# Patient Record
Sex: Male | Born: 1955
Health system: Southern US, Community
[De-identification: ages and names within clinical notes are randomized; demographics above are authoritative.]

## PROBLEM LIST (undated history)

## (undated) DIAGNOSIS — R519 Headache, unspecified: Secondary | ICD-10-CM

## (undated) DIAGNOSIS — M1990 Unspecified osteoarthritis, unspecified site: Secondary | ICD-10-CM

## (undated) DIAGNOSIS — G8929 Other chronic pain: Secondary | ICD-10-CM

## (undated) DIAGNOSIS — I10 Essential (primary) hypertension: Secondary | ICD-10-CM

## (undated) DIAGNOSIS — F419 Anxiety disorder, unspecified: Secondary | ICD-10-CM

## (undated) DIAGNOSIS — K219 Gastro-esophageal reflux disease without esophagitis: Secondary | ICD-10-CM

## (undated) DIAGNOSIS — L02415 Cutaneous abscess of right lower limb: Secondary | ICD-10-CM

## (undated) DIAGNOSIS — I82402 Acute embolism and thrombosis of unspecified deep veins of left lower extremity: Secondary | ICD-10-CM

## (undated) DIAGNOSIS — R51 Headache: Secondary | ICD-10-CM

## (undated) DIAGNOSIS — Z9889 Other specified postprocedural states: Secondary | ICD-10-CM

## (undated) DIAGNOSIS — D649 Anemia, unspecified: Secondary | ICD-10-CM

## (undated) DIAGNOSIS — I2510 Atherosclerotic heart disease of native coronary artery without angina pectoris: Secondary | ICD-10-CM

## (undated) DIAGNOSIS — E785 Hyperlipidemia, unspecified: Secondary | ICD-10-CM

## (undated) DIAGNOSIS — R112 Nausea with vomiting, unspecified: Secondary | ICD-10-CM

## (undated) DIAGNOSIS — M159 Polyosteoarthritis, unspecified: Secondary | ICD-10-CM

## (undated) DIAGNOSIS — D689 Coagulation defect, unspecified: Secondary | ICD-10-CM

## (undated) DIAGNOSIS — F17201 Nicotine dependence, unspecified, in remission: Secondary | ICD-10-CM

## (undated) HISTORY — DX: Hyperlipidemia, unspecified: E78.5

## (undated) HISTORY — PX: HERNIA REPAIR: SHX51

## (undated) HISTORY — DX: Atherosclerotic heart disease of native coronary artery without angina pectoris: I25.10

## (undated) HISTORY — DX: Nicotine dependence, unspecified, in remission: F17.201

## (undated) HISTORY — DX: Cutaneous abscess of right lower limb: L02.415

## (undated) HISTORY — DX: Anxiety disorder, unspecified: F41.9

## (undated) HISTORY — DX: Essential (primary) hypertension: I10

## (undated) HISTORY — DX: Other chronic pain: G89.29

## (undated) HISTORY — PX: KNEE ARTHROSCOPY W/ MENISCAL REPAIR: SHX1877

## (undated) HISTORY — PX: OTHER SURGICAL HISTORY: SHX169

## (undated) HISTORY — PX: SPINE SURGERY: SHX786

## (undated) HISTORY — DX: Polyosteoarthritis, unspecified: M15.9

## (undated) HISTORY — PX: NASAL SINUS SURGERY: SHX719

## (undated) HISTORY — DX: Headache, unspecified: R51.9

## (undated) HISTORY — DX: Coagulation defect, unspecified: D68.9

## (undated) HISTORY — PX: REPLACEMENT TOTAL KNEE: SUR1224

## (undated) HISTORY — DX: Headache: R51

## (undated) HISTORY — DX: Unspecified osteoarthritis, unspecified site: M19.90

## (undated) HISTORY — PX: JOINT REPLACEMENT: SHX530

## (undated) HISTORY — DX: Gastro-esophageal reflux disease without esophagitis: K21.9

## (undated) HISTORY — DX: Acute embolism and thrombosis of unspecified deep veins of left lower extremity: I82.402

## (undated) HISTORY — DX: Anemia, unspecified: D64.9

---
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
## 2001-06-04 HISTORY — PX: UMBILICAL HERNIA REPAIR: SHX196

## 2002-01-27 ENCOUNTER — Ambulatory Visit (HOSPITAL_COMMUNITY): Admission: RE | Admit: 2002-01-27 | Discharge: 2002-01-27 | Payer: Self-pay | Admitting: Pulmonary Disease

## 2002-04-07 ENCOUNTER — Ambulatory Visit (HOSPITAL_COMMUNITY): Admission: RE | Admit: 2002-04-07 | Discharge: 2002-04-07 | Payer: Self-pay | Admitting: Pulmonary Disease

## 2002-05-22 ENCOUNTER — Encounter: Payer: Self-pay | Admitting: Orthopaedic Surgery

## 2002-05-22 ENCOUNTER — Encounter (HOSPITAL_COMMUNITY): Admission: RE | Admit: 2002-05-22 | Discharge: 2002-06-21 | Payer: Self-pay | Admitting: Orthopaedic Surgery

## 2002-08-20 ENCOUNTER — Encounter: Payer: Self-pay | Admitting: Gastroenterology

## 2004-09-22 ENCOUNTER — Ambulatory Visit (HOSPITAL_COMMUNITY): Admission: RE | Admit: 2004-09-22 | Discharge: 2004-09-22 | Payer: Self-pay | Admitting: Urology

## 2004-10-16 ENCOUNTER — Other Ambulatory Visit: Admission: RE | Admit: 2004-10-16 | Discharge: 2004-10-16 | Payer: Self-pay | Admitting: Urology

## 2005-01-22 ENCOUNTER — Ambulatory Visit (HOSPITAL_COMMUNITY): Admission: RE | Admit: 2005-01-22 | Discharge: 2005-01-22 | Payer: Self-pay | Admitting: Pulmonary Disease

## 2005-02-15 ENCOUNTER — Ambulatory Visit (HOSPITAL_COMMUNITY): Admission: RE | Admit: 2005-02-15 | Discharge: 2005-02-15 | Payer: Self-pay | Admitting: Orthopaedic Surgery

## 2005-02-15 ENCOUNTER — Encounter (INDEPENDENT_AMBULATORY_CARE_PROVIDER_SITE_OTHER): Payer: Self-pay | Admitting: Orthopaedic Surgery

## 2005-02-23 ENCOUNTER — Ambulatory Visit (HOSPITAL_COMMUNITY): Admission: RE | Admit: 2005-02-23 | Discharge: 2005-02-23 | Payer: Self-pay | Admitting: Pulmonary Disease

## 2006-06-04 HISTORY — PX: COLONOSCOPY: SHX174

## 2006-09-03 ENCOUNTER — Ambulatory Visit: Payer: Self-pay | Admitting: Gastroenterology

## 2006-09-17 ENCOUNTER — Ambulatory Visit: Payer: Self-pay | Admitting: Gastroenterology

## 2006-11-13 ENCOUNTER — Emergency Department: Payer: Self-pay | Admitting: Emergency Medicine

## 2007-08-15 ENCOUNTER — Encounter: Payer: Self-pay | Admitting: Cardiology

## 2007-08-15 LAB — CONVERTED CEMR LAB
Cholesterol: 131 mg/dL
HDL: 45 mg/dL
LDL Cholesterol: 67 mg/dL
Triglyceride fasting, serum: 96 mg/dL

## 2008-06-08 ENCOUNTER — Ambulatory Visit: Payer: Self-pay | Admitting: Cardiology

## 2008-06-08 ENCOUNTER — Ambulatory Visit (HOSPITAL_COMMUNITY): Admission: RE | Admit: 2008-06-08 | Discharge: 2008-06-08 | Payer: Self-pay | Admitting: Cardiology

## 2008-06-09 ENCOUNTER — Encounter: Payer: Self-pay | Admitting: Cardiology

## 2008-06-09 LAB — CONVERTED CEMR LAB
ALT: 25 units/L
AST: 21 units/L
Albumin: 4.6 g/dL
Alkaline Phosphatase: 77 units/L
BUN: 15 mg/dL
CO2: 24 meq/L
Calcium: 9.7 mg/dL
Chloride: 104 meq/L
Creatinine, Ser: 1.26 mg/dL
Glucose, Bld: 91 mg/dL
HCT: 41.5 %
Hemoglobin: 13.9 g/dL
Hgb A1c MFr Bld: 5.9 %
MCV: 86 fL
Platelets: 327 10*3/uL
Potassium: 4.5 meq/L
Sodium: 142 meq/L
Total Bilirubin: 0.4 mg/dL
Total Protein: 7.3 g/dL
WBC: 7.8 10*3/uL

## 2008-06-11 ENCOUNTER — Encounter: Payer: Self-pay | Admitting: Cardiology

## 2008-06-11 ENCOUNTER — Ambulatory Visit: Payer: Self-pay | Admitting: Cardiology

## 2008-06-11 ENCOUNTER — Ambulatory Visit (HOSPITAL_COMMUNITY): Admission: RE | Admit: 2008-06-11 | Discharge: 2008-06-11 | Payer: Self-pay | Admitting: Cardiology

## 2008-06-21 ENCOUNTER — Ambulatory Visit: Payer: Self-pay | Admitting: Cardiology

## 2008-06-22 ENCOUNTER — Ambulatory Visit (HOSPITAL_COMMUNITY): Admission: RE | Admit: 2008-06-22 | Discharge: 2008-06-22 | Payer: Self-pay | Admitting: Pulmonary Disease

## 2008-12-03 DIAGNOSIS — K219 Gastro-esophageal reflux disease without esophagitis: Secondary | ICD-10-CM | POA: Insufficient documentation

## 2008-12-09 ENCOUNTER — Ambulatory Visit: Payer: Self-pay | Admitting: Gastroenterology

## 2008-12-14 ENCOUNTER — Encounter (INDEPENDENT_AMBULATORY_CARE_PROVIDER_SITE_OTHER): Payer: Self-pay | Admitting: *Deleted

## 2008-12-17 ENCOUNTER — Ambulatory Visit: Payer: Self-pay | Admitting: Gastroenterology

## 2008-12-23 ENCOUNTER — Ambulatory Visit: Payer: Self-pay | Admitting: Gastroenterology

## 2008-12-31 ENCOUNTER — Ambulatory Visit: Payer: Self-pay | Admitting: Gastroenterology

## 2009-01-10 ENCOUNTER — Ambulatory Visit: Payer: Self-pay | Admitting: Gastroenterology

## 2009-01-14 ENCOUNTER — Ambulatory Visit: Payer: Self-pay | Admitting: Gastroenterology

## 2009-01-24 ENCOUNTER — Ambulatory Visit: Payer: Self-pay | Admitting: Internal Medicine

## 2009-01-31 ENCOUNTER — Ambulatory Visit: Payer: Self-pay | Admitting: Gastroenterology

## 2009-02-11 ENCOUNTER — Ambulatory Visit: Payer: Self-pay | Admitting: Gastroenterology

## 2009-02-25 ENCOUNTER — Ambulatory Visit: Payer: Self-pay | Admitting: Gastroenterology

## 2009-03-04 ENCOUNTER — Ambulatory Visit: Payer: Self-pay | Admitting: Gastroenterology

## 2009-03-07 ENCOUNTER — Telehealth: Payer: Self-pay | Admitting: Gastroenterology

## 2009-03-09 ENCOUNTER — Encounter: Payer: Self-pay | Admitting: Gastroenterology

## 2009-04-04 ENCOUNTER — Ambulatory Visit: Payer: Self-pay | Admitting: Gastroenterology

## 2009-04-25 ENCOUNTER — Encounter: Payer: Self-pay | Admitting: Gastroenterology

## 2009-04-25 ENCOUNTER — Telehealth: Payer: Self-pay | Admitting: Gastroenterology

## 2009-04-25 ENCOUNTER — Ambulatory Visit (HOSPITAL_COMMUNITY): Admission: RE | Admit: 2009-04-25 | Discharge: 2009-04-25 | Payer: Self-pay | Admitting: Gastroenterology

## 2009-05-10 ENCOUNTER — Ambulatory Visit (HOSPITAL_COMMUNITY): Admission: RE | Admit: 2009-05-10 | Discharge: 2009-05-10 | Payer: Self-pay | Admitting: Gastroenterology

## 2009-05-10 ENCOUNTER — Ambulatory Visit: Payer: Self-pay | Admitting: Gastroenterology

## 2009-05-12 ENCOUNTER — Encounter: Payer: Self-pay | Admitting: Gastroenterology

## 2009-05-13 ENCOUNTER — Telehealth: Payer: Self-pay | Admitting: Gastroenterology

## 2009-06-08 ENCOUNTER — Ambulatory Visit: Payer: Self-pay | Admitting: Gastroenterology

## 2009-06-21 ENCOUNTER — Telehealth: Payer: Self-pay | Admitting: Gastroenterology

## 2009-08-12 ENCOUNTER — Encounter: Payer: Self-pay | Admitting: *Deleted

## 2009-08-15 ENCOUNTER — Encounter: Payer: Self-pay | Admitting: Cardiology

## 2009-08-16 ENCOUNTER — Ambulatory Visit: Payer: Self-pay | Admitting: Cardiology

## 2009-08-16 ENCOUNTER — Encounter (INDEPENDENT_AMBULATORY_CARE_PROVIDER_SITE_OTHER): Payer: Self-pay | Admitting: *Deleted

## 2009-08-26 ENCOUNTER — Encounter: Payer: Self-pay | Admitting: Cardiology

## 2009-08-29 ENCOUNTER — Ambulatory Visit: Payer: Self-pay | Admitting: Cardiology

## 2009-08-29 ENCOUNTER — Encounter: Payer: Self-pay | Admitting: Cardiology

## 2009-08-29 ENCOUNTER — Encounter (INDEPENDENT_AMBULATORY_CARE_PROVIDER_SITE_OTHER): Payer: Self-pay | Admitting: *Deleted

## 2009-09-03 ENCOUNTER — Encounter: Payer: Self-pay | Admitting: Cardiology

## 2009-09-03 LAB — CONVERTED CEMR LAB
BUN: 16 mg/dL (ref 6–23)
Basophils Absolute: 0 10*3/uL (ref 0.0–0.1)
Basophils Relative: 0 % (ref 0–1)
CO2: 26 meq/L (ref 19–32)
Calcium: 9.5 mg/dL (ref 8.4–10.5)
Chloride: 105 meq/L (ref 96–112)
Creatinine, Ser: 0.99 mg/dL (ref 0.40–1.50)
Eosinophils Absolute: 0.2 10*3/uL (ref 0.0–0.7)
Eosinophils Relative: 2 % (ref 0–5)
Glucose, Bld: 108 mg/dL — ABNORMAL HIGH (ref 70–99)
HCT: 43.2 % (ref 39.0–52.0)
Hemoglobin: 14.7 g/dL (ref 13.0–17.0)
INR: 0.98 (ref <1.50)
Lymphocytes Relative: 25 % (ref 12–46)
Lymphs Abs: 2 10*3/uL (ref 0.7–4.0)
MCHC: 34 g/dL (ref 30.0–36.0)
MCV: 86.2 fL (ref 78.0–100.0)
Monocytes Absolute: 0.6 10*3/uL (ref 0.1–1.0)
Monocytes Relative: 8 % (ref 3–12)
Neutro Abs: 5.1 10*3/uL (ref 1.7–7.7)
Neutrophils Relative %: 64 % (ref 43–77)
Platelets: 313 10*3/uL (ref 150–400)
Potassium: 4.3 meq/L (ref 3.5–5.3)
Prothrombin Time: 12.9 s (ref 11.6–15.2)
RBC: 5.01 M/uL (ref 4.22–5.81)
RDW: 12.9 % (ref 11.5–15.5)
Sodium: 140 meq/L (ref 135–145)
WBC: 8 10*3/uL (ref 4.0–10.5)
aPTT: 26 s (ref 24–37)

## 2009-09-06 ENCOUNTER — Encounter (INDEPENDENT_AMBULATORY_CARE_PROVIDER_SITE_OTHER): Payer: Self-pay | Admitting: *Deleted

## 2009-09-07 ENCOUNTER — Telehealth: Payer: Self-pay | Admitting: Cardiology

## 2009-09-12 ENCOUNTER — Encounter: Payer: Self-pay | Admitting: Cardiology

## 2009-09-12 ENCOUNTER — Encounter (INDEPENDENT_AMBULATORY_CARE_PROVIDER_SITE_OTHER): Payer: Self-pay

## 2009-09-13 ENCOUNTER — Encounter (INDEPENDENT_AMBULATORY_CARE_PROVIDER_SITE_OTHER): Payer: Self-pay | Admitting: *Deleted

## 2009-09-13 LAB — CONVERTED CEMR LAB
BUN: 14 mg/dL
BUN: 14 mg/dL
BUN: 14 mg/dL (ref 6–23)
Basophils Absolute: 0 10*3/uL (ref 0.0–0.1)
Basophils Relative: 1 % (ref 0–1)
CO2: 24 meq/L
CO2: 24 meq/L
CO2: 24 meq/L (ref 19–32)
Calcium: 9.3 mg/dL
Calcium: 9.3 mg/dL
Calcium: 9.3 mg/dL (ref 8.4–10.5)
Chloride: 103 meq/L
Chloride: 103 meq/L
Chloride: 103 meq/L (ref 96–112)
Creatinine, Ser: 0.86 mg/dL
Creatinine, Ser: 0.86 mg/dL
Creatinine, Ser: 0.86 mg/dL (ref 0.40–1.50)
Eosinophils Absolute: 0.2 10*3/uL (ref 0.0–0.7)
Eosinophils Relative: 3 % (ref 0–5)
Glucose, Bld: 84 mg/dL
Glucose, Bld: 84 mg/dL
Glucose, Bld: 84 mg/dL (ref 70–99)
HCT: 40.2 %
HCT: 40.2 %
HCT: 40.2 % (ref 39.0–52.0)
Hemoglobin: 13.2 g/dL
Hemoglobin: 13.2 g/dL
Hemoglobin: 13.2 g/dL (ref 13.0–17.0)
INR: 1
INR: 1 (ref <1.50)
Lymphocytes Relative: 29 % (ref 12–46)
Lymphs Abs: 2.4 10*3/uL (ref 0.7–4.0)
MCHC: 32.8 g/dL (ref 30.0–36.0)
MCV: 86.8 fL
MCV: 86.8 fL
MCV: 86.8 fL (ref 78.0–100.0)
Monocytes Absolute: 0.7 10*3/uL (ref 0.1–1.0)
Monocytes Relative: 9 % (ref 3–12)
Neutro Abs: 4.9 10*3/uL (ref 1.7–7.7)
Neutrophils Relative %: 59 % (ref 43–77)
POC INR: 1
Platelets: 319 10*3/uL
Platelets: 319 10*3/uL
Platelets: 319 10*3/uL (ref 150–400)
Potassium: 4.2 meq/L
Potassium: 4.2 meq/L
Potassium: 4.2 meq/L (ref 3.5–5.3)
Prothrombin Time: 13.1 s
Prothrombin Time: 13.1 s
Prothrombin Time: 13.1 s (ref 11.6–15.2)
RBC: 4.63 M/uL (ref 4.22–5.81)
RDW: 12.9 % (ref 11.5–15.5)
Sodium: 140 meq/L
Sodium: 140 meq/L
Sodium: 140 meq/L (ref 135–145)
WBC: 8.3 10*3/uL
WBC: 8.3 10*3/uL
WBC: 8.3 10*3/uL (ref 4.0–10.5)
aPTT: 27 s
aPTT: 27 s
aPTT: 27 s (ref 24–37)

## 2009-09-14 ENCOUNTER — Encounter (INDEPENDENT_AMBULATORY_CARE_PROVIDER_SITE_OTHER): Payer: Self-pay | Admitting: *Deleted

## 2009-09-15 ENCOUNTER — Encounter (INDEPENDENT_AMBULATORY_CARE_PROVIDER_SITE_OTHER): Payer: Self-pay | Admitting: *Deleted

## 2009-09-19 ENCOUNTER — Ambulatory Visit: Payer: Self-pay | Admitting: Cardiovascular Disease

## 2009-09-19 ENCOUNTER — Inpatient Hospital Stay (HOSPITAL_BASED_OUTPATIENT_CLINIC_OR_DEPARTMENT_OTHER): Admission: RE | Admit: 2009-09-19 | Discharge: 2009-09-19 | Payer: Self-pay | Admitting: Cardiovascular Disease

## 2009-10-06 ENCOUNTER — Ambulatory Visit: Payer: Self-pay | Admitting: Cardiology

## 2009-10-06 ENCOUNTER — Encounter (INDEPENDENT_AMBULATORY_CARE_PROVIDER_SITE_OTHER): Payer: Self-pay | Admitting: *Deleted

## 2009-10-07 ENCOUNTER — Encounter: Payer: Self-pay | Admitting: Adult Health

## 2009-11-04 ENCOUNTER — Encounter: Payer: Self-pay | Admitting: *Deleted

## 2009-11-10 ENCOUNTER — Ambulatory Visit: Payer: Self-pay | Admitting: Cardiology

## 2009-11-10 DIAGNOSIS — F411 Generalized anxiety disorder: Secondary | ICD-10-CM | POA: Insufficient documentation

## 2010-01-03 ENCOUNTER — Encounter (INDEPENDENT_AMBULATORY_CARE_PROVIDER_SITE_OTHER): Payer: Self-pay | Admitting: *Deleted

## 2010-01-03 LAB — CONVERTED CEMR LAB
ALT: 19 units/L
ALT: 19 units/L (ref 0–53)
AST: 18 units/L
AST: 18 units/L (ref 0–37)
Albumin: 4.4 g/dL
Albumin: 4.4 g/dL (ref 3.5–5.2)
Alkaline Phosphatase: 57 units/L
Alkaline Phosphatase: 57 units/L (ref 39–117)
BUN: 20 mg/dL
BUN: 20 mg/dL (ref 6–23)
CO2: 22 meq/L
CO2: 22 meq/L (ref 19–32)
Calcium: 9.5 mg/dL
Calcium: 9.5 mg/dL (ref 8.4–10.5)
Chloride: 106 meq/L
Chloride: 106 meq/L (ref 96–112)
Cholesterol: 142 mg/dL
Cholesterol: 142 mg/dL (ref 0–200)
Creatinine, Ser: 1.01 mg/dL
Creatinine, Ser: 1.01 mg/dL (ref 0.40–1.50)
Glucose, Bld: 110 mg/dL
Glucose, Bld: 110 mg/dL — ABNORMAL HIGH (ref 70–99)
HDL: 46 mg/dL
HDL: 46 mg/dL (ref >39)
LDL Cholesterol: 82 mg/dL
LDL Cholesterol: 82 mg/dL (ref 0–99)
Potassium: 4.7 meq/L
Potassium: 4.7 meq/L (ref 3.5–5.3)
Sodium: 139 meq/L
Sodium: 139 meq/L (ref 135–145)
Total Bilirubin: 0.4 mg/dL (ref 0.3–1.2)
Total CHOL/HDL Ratio: 3.1
Total Protein: 7 g/dL
Total Protein: 7 g/dL (ref 6.0–8.3)
Triglycerides: 70 mg/dL
Triglycerides: 70 mg/dL (ref <150)
VLDL: 14 mg/dL (ref 0–40)

## 2010-01-11 ENCOUNTER — Encounter (INDEPENDENT_AMBULATORY_CARE_PROVIDER_SITE_OTHER): Payer: Self-pay | Admitting: *Deleted

## 2010-01-17 ENCOUNTER — Ambulatory Visit: Payer: Self-pay | Admitting: Cardiology

## 2010-01-18 ENCOUNTER — Encounter: Payer: Self-pay | Admitting: Cardiology

## 2010-02-16 ENCOUNTER — Encounter (INDEPENDENT_AMBULATORY_CARE_PROVIDER_SITE_OTHER): Payer: Self-pay | Admitting: *Deleted

## 2010-02-16 ENCOUNTER — Ambulatory Visit: Payer: Self-pay | Admitting: Cardiology

## 2010-02-23 ENCOUNTER — Encounter (INDEPENDENT_AMBULATORY_CARE_PROVIDER_SITE_OTHER): Payer: Self-pay | Admitting: *Deleted

## 2010-02-23 LAB — CONVERTED CEMR LAB
Cholesterol: 140 mg/dL
Cholesterol: 140 mg/dL (ref 0–200)
HDL: 43 mg/dL
HDL: 43 mg/dL (ref >39)
LDL Cholesterol: 78 mg/dL
LDL Cholesterol: 78 mg/dL (ref 0–99)
Total CHOL/HDL Ratio: 3.3
Triglycerides: 97 mg/dL
Triglycerides: 97 mg/dL (ref <150)
VLDL: 19 mg/dL (ref 0–40)

## 2010-02-24 ENCOUNTER — Encounter (INDEPENDENT_AMBULATORY_CARE_PROVIDER_SITE_OTHER): Payer: Self-pay | Admitting: *Deleted

## 2010-03-15 ENCOUNTER — Encounter (INDEPENDENT_AMBULATORY_CARE_PROVIDER_SITE_OTHER): Payer: Self-pay | Admitting: *Deleted

## 2010-03-18 LAB — CONVERTED CEMR LAB
Cholesterol: 136 mg/dL (ref 0–200)
HDL: 48 mg/dL (ref >39)
LDL Cholesterol: 70 mg/dL (ref 0–99)
Total CHOL/HDL Ratio: 2.8
Triglycerides: 89 mg/dL (ref <150)
VLDL: 18 mg/dL (ref 0–40)

## 2010-03-23 ENCOUNTER — Encounter (INDEPENDENT_AMBULATORY_CARE_PROVIDER_SITE_OTHER): Payer: Self-pay | Admitting: *Deleted

## 2010-03-23 ENCOUNTER — Ambulatory Visit: Payer: Self-pay | Admitting: Cardiology

## 2010-06-25 ENCOUNTER — Encounter: Payer: Self-pay | Admitting: Cardiology

## 2010-07-02 LAB — CONVERTED CEMR LAB
LDL (calc): 82 mg/dL
Triglyceride fasting, serum: 70 mg/dL

## 2010-07-04 NOTE — Progress Notes (Signed)
Summary: pt sch for cath 09-08-09 but has slight cold   Phone Note Call from Patient Call back at [facility] Phone [phone] Call back at [phone] cell   Caller: pt Reason for Call: Talk to Nurse Summary of Call: pt is scheduled to have cath 09-08-09. He has had a cold for about a week and is almost over it but is still slightly congested, is the okay or does he need to reschedule his cath? Initial call taken by: Faythe Ghee,  September 07, 2009 9:19 AM  Follow-up for Phone Call        cath cx, pt will call when he is well and we will r/s for 1 week later per jv cath lab Follow-up by: Teressa Lower RN,  September 07, 2009 10:00 AM

## 2010-07-04 NOTE — Letter (Signed)
Summary: [place] [employer], agricultural at [facility]  [address] [place] [address]   Phone: [phone]  Fax: [phone]      September 06, 2009 MRN: [id number]   [facility] [address] [place], [place]  [address]   Dear Timothy Shaw,  Your test ordered by Selena Batten has been reviewed by your physician (or physician assistant) and was found to be normal or stable. Your physician (or physician assistant) felt no changes were needed at this time.  ____ Echocardiogram  ____ Cardiac Stress Test  __x__ Lab Work  ____ Peripheral vascular study of arms, legs or neck  ____ CT scan or X-ray  ____ Lung or Breathing test  ____ Other:   Thank you.   Teressa Lower RN    Boerne Bing, MD, F.A.C.Gaylord Shih, MD, F.A.C.C Lewayne Bunting, MD, F.A.C.C Nona Dell, MD, F.A.C.C Charlton Haws, MD, Lenise Arena.C.C

## 2010-07-04 NOTE — Assessment & Plan Note (Signed)
Summary: DUE FOR F/U PER PT PHONE CALL/TG  Medications Added NEXIUM 40 MG CPDR (ESOMEPRAZOLE MAGNESIUM) take 1 tab daily NITROSTAT 0.4 MG SUBL (NITROGLYCERIN) 1 tablet under tongue at onset of chest pain; you may repeat every 5 minutes for up to 3 doses.      Allergies Added: NKDA  Visit Type:  Follow-up Referring Provider:  Gerald Leitz Primary Provider:  Kari Baars, MD   History of Present Illness: Return visit for this pleasant 55 year old gentleman sent for reevaluation of chest discomfort.  I had the pleasure of evaluating Timothy Shaw last year at which time a stress echocardiogram was negative.  He did well for a few months, gradually increasing exercise capacity, but subsequently noted burning upper substernal chest discomfort associated with neck discomfort occurring with moderate exercise.  There were no associated symptoms.  There is no radiation of the discomfort.  These symptoms are distinct from his usual gastroesophageal reflux disease.  Current Medications (verified): 1)  Cozaar 100 Mg Tabs (Losartan Potassium) .Marland Kitchen.. 1 Tablet By Mouth Once Daily 2)  Crestor 5 Mg Tabs (Rosuvastatin Calcium) .Marland Kitchen.. 1 By Mouth Once Daily 3)  Tums 500 Mg Chew (Calcium Carbonate Antacid) .... Take 3-4 Times Per Day As Needed 4)  Nexium 40 Mg Cpdr (Esomeprazole Magnesium) .... Take 1 Tab Daily 5)  Nitrostat 0.4 Mg Subl (Nitroglycerin) .Marland Kitchen.. 1 Tablet Under Tongue At Onset of Chest Pain; You May Repeat Every 5 Minutes For Up To 3 Doses.  Allergies (verified): No Known Drug Allergies  Past History:  PMS  Past Surgical History: Herniorrhaphy-umbilical Appendectomy Tonsillectomy Sinus surgery Laparoscopic right knee surgery  Review of Systems       see history of present illness.  Vital Signs:  Patient profile:   55 year old male Height:      74 inches Weight:      215 pounds BMI:     27.70 Pulse rate:   66 / minute BP sitting:   127 / 74  (right arm)  Vitals Entered By: Dreama Saa, CNA (August 16, 2009 1:39 PM)  Physical Exam  General:  Pleasant proportionate gentleman in no acute distress. Weight is 215 pounds, 2 pounds less than at his last visit NECK:  No jugular venous distention; normal carotid upstrokes without bruits. HEMATOPOIETIC:  No adenopathy. LUNGS:  Clear. CARDIAC:  Normal first and second heart sound; normal PMI. ABDOMEN:  Soft and nontender; no organomegaly. EXTREMITIES:  No edema; normal distal pulses. NEUROLOGIC:  Symmetric strength and tone; normal cranial nerves.  SKIN:  no significant lesions.   Impression & Recommendations:  Problem # 1:  CHEST PAIN (ICD-786.50) Patient's symptoms would qualify as classic angina if not for a long-standing history of atypical chest discomfort and for a fairly recent negative stress test.  We will proceed with a stress nuclear study to reevaluate exercise tolerance, to determine if symptoms can be elicited by exercise and rule out myocardial ischemia.  In the interim, he will be provided with a prescription for sublingual nitroglycerin to use as needed had these symptoms and to determine iif that drug shortens the duration of spells and improves exercise tolerance.  Problem # 2:  HYPERLIPIDEMIA (ICD-272.4) Most recent lipid profile available to me is from 2 years ago, but was excellent.  We will seek more recent values from his primary care doctor.  Problem # 3:  HYPERTENSION (ICD-401.1) Blood pressure control is excellent with very modest medical therapy.  I will reassess this nice gentleman after his stress test  has been completed.  Other Orders: Nuclear Stress Test (Nuc Stress Test)  Patient Instructions: 1)  Your physician recommends that you schedule a follow-up appointment in: after stress test 2)  Your physician has requested that you have an exercise stress myoview.  For further information please visit [email or web address]  Please follow instruction sheet, as  given. Prescriptions: NITROSTAT 0.4 MG SUBL (NITROGLYCERIN) 1 tablet under tongue at onset of chest pain; you may repeat every 5 minutes for up to 3 doses.  #25 x 3   Entered by:   Teressa Lower RN   Authorized by:   Kathlen Brunswick, MD, [facility]   Signed by:   Teressa Lower RN on 08/16/2009   Method used:   Electronically to        [facility] (retail)       [address]       [place], [place]  [address]       Ph: [phone]       Fax: [phone]   RxID:   [id number]

## 2010-07-04 NOTE — Miscellaneous (Signed)
Summary: Orders Update  Clinical Lists Changes  Orders: Added new Test order of T-Basic Metabolic Panel [id number]) - Signed Added new Test order of T-CBC w/Diff [phone]) - Signed Added new Test order of T-Protime, Auto ([id number]) - Signed Added new Test order of T-PTT ([id number]) - Signed Added new Referral order of Cardiac Catheterization (Cardiac Cath) - Signed    Pt. came into office to reschedule cath, he states he is feeling better from recent cold. Pt. given Cath instuctions and labs orders, he states he understands info. given. Cath scheduled for 09-19-09 @ 8:30am in [facility] lab with Dr. Excell Seltzer or Dr. Riley Kill.

## 2010-07-04 NOTE — Assessment & Plan Note (Signed)
Summary: eph post cath  Medications Added SIMVASTATIN 40 MG TABS (SIMVASTATIN) Take one tablet by mouth daily at bedtime ASPIR-TRIN 325 MG TBEC (ASPIRIN) take 1 tab daily METOPROLOL TARTRATE 50 MG TABS (METOPROLOL TARTRATE) Take one tablet by mouth twice a day AMLODIPINE BESYLATE 5 MG TABS (AMLODIPINE BESYLATE) Take one tablet by mouth daily      Allergies Added: NKDA  Visit Type:  Follow-up Referring Provider:  Gerald Leitz Primary Provider:  Kari Baars, MD   History of Present Illness: [name] returns to the office following a recent cardiac catheterization, which revealed total occlusion of the right coronary artery and a 60% mid LAD stenosis.  The RCA was not considered favorable for percutaneous intervention.  [name] now comes to the office to discuss his options, either medical therapy or CABG surgery.  He has resumed his usual physical activity, which has produced angina as before; however, after taking one nitroglycerin, he is able to do much more than he has been able to do for some time.   Current Medications (verified): 1)  Cozaar 100 Mg Tabs (Losartan Potassium) .Marland Kitchen.. 1 Tablet By Mouth Once Daily 2)  Simvastatin 40 Mg Tabs (Simvastatin) .... Take One Tablet By Mouth Daily At Bedtime 3)  Tums 500 Mg Chew (Calcium Carbonate Antacid) .... Take 3-4 Times Per Day As Needed 4)  Nexium 40 Mg Cpdr (Esomeprazole Magnesium) .... Take 1 Tab Daily 5)  Nitrostat 0.4 Mg Subl (Nitroglycerin) .Marland Kitchen.. 1 Tablet Under Tongue At Onset of Chest Pain; You May Repeat Every 5 Minutes For Up To 3 Doses. 6)  Aspir-Trin 325 Mg Tbec (Aspirin) .... Take 1 Tab Daily 7)  Metoprolol Tartrate 50 Mg Tabs (Metoprolol Tartrate) .... Take One Tablet By Mouth Twice A Day 8)  Amlodipine Besylate 5 Mg Tabs (Amlodipine Besylate) .... Take One Tablet By Mouth Daily  Allergies (verified): No Known Drug Allergies  Past History:  PMH, FH, and Social History reviewed and updated.  Past Medical  History: ASCVD with angina: 09/2009-100% proximal RCA; 60% mid LAD; normal EF Hyperlipidemia Hypertension-Left ventricular hypertrophy Gastroesophageal reflux disease Anxiety Disorder Arthritis Chronic Headaches  Review of Systems       See HPI.  Vital Signs:  Patient profile:   55 year old male Weight:      208 pounds Pulse rate:   68 / minute BP sitting:   150 / 77  (right arm)  Vitals Entered By: Dreama Saa, CNA (Oct 06, 2009 1:06 PM)  Physical Exam  General:  Pleasant proportionate gentleman in no acute distress. Weight is 215 pounds, 2 pounds less than at his last visit NECK:  No jugular venous distention; normal carotid upstrokes without bruits. HEMATOPOIETIC:  No adenopathy. LUNGS:  Clear. CARDIAC:  Normal first and second heart sound; normal PMI. ABDOMEN:  Soft and nontender; no organomegaly. EXTREMITIES:  No edema; normal distal pulses; cath site is benign. NEUROLOGIC:  Symmetric strength and tone; normal cranial nerves.  SKIN:  no significant lesions.   Impression & Recommendations:  Problem # 1:  ATHEROSCLEROTIC CARDIOVASCULAR DISEASE (ICD-429.2) 30 minute conversation with patient and family regarding his situation.  I explained the pathophysiology of coronary disease, his exact anatomy and our goals for treatment.  Since he is low risk for sudden death or a serious myocardial infarction, I have recommended an initial trial of medical therapy.  He'll start metoprolol 50 mg b.i.d. and amlodipine 5 mg q.d. and will continue to use nitroglycerin as needed.  I did not suggest  the use of nitroglycerin prophylactically prior to planned activity, but this may be a useful approach in the future.  Additional therapies to consider are long-acting nitrates and ranolazine.  Symptoms will be reassessed at a return visit in one month.  Other Orders: Future Orders: T-Lipid Profile ([id number]) ... 01/06/2010 T-Comprehensive Metabolic Panel [id number]) ...  01/06/2010  Patient Instructions: 1)  Your physician recommends that you schedule a follow-up appointment in: 1 month  2)  Your physician recommends that you return for lab work in: 3 months 3)  Your physician has recommended you make the following change in your medication: metoprolol 50mg  two times a day 4)  change crestor to simvastatin 40mg  daily 5)  amlodipine 5mg  daily Prescriptions: AMLODIPINE BESYLATE 5 MG TABS (AMLODIPINE BESYLATE) Take one tablet by mouth daily  #30 x 6   Entered by:   Teressa Lower RN   Authorized by:   Joni Reining, NP   Signed by:   Teressa Lower RN on 10/06/2009   Method used:   Electronically to        [facility] (retail)       [address]       [place], [place]  [address]       Ph: [phone]       Fax: [phone]   RxID:   [id number] METOPROLOL TARTRATE 50 MG TABS (METOPROLOL TARTRATE) Take one tablet by mouth twice a day  #60 x 3   Entered by:   Teressa Lower RN   Authorized by:   Joni Reining, NP   Signed by:   Teressa Lower RN on 10/06/2009   Method used:   Electronically to        [facility] (retail)       [address]       [place], [place]  [address]       Ph: [phone]       Fax: [phone]   RxID:   [id number] SIMVASTATIN 40 MG TABS (SIMVASTATIN) Take one tablet by mouth daily at bedtime  #30 x 6   Entered by:   Teressa Lower RN   Authorized by:   Joni Reining, NP   Signed by:   Teressa Lower RN on 10/06/2009   Method used:   Electronically to        [facility] (retail)       [address]       [place], [place]  [address]       Ph: [phone]       Fax: [phone]   RxID:   [id number]

## 2010-07-04 NOTE — Letter (Signed)
Summary: Cardiac Catheterization Instructions- [facility] Lab  [facility] [place]  [address] [place] [address]   Phone: [phone]  Fax: [phone]     08/29/2009 MRN: [id number]  [facility] [address] [place], [place]  [address]  Dear Mr. Gehring,   You are scheduled for a Cardiac Catheterization on September 08, 2009 with Dr.Cooper  Please arrive to the 1st floor of the Heart and Vascular Center at [facility] at 7:30 am on the day of your procedure. Please do not arrive before 6:30 a.m. Call the Heart and Vascular Center at [phone] if you are unable to make your appointmnet. The Code to get into the parking garage under the building is 0001. Take the elevators to the 1st floor. You must have someone to drive you home. Someone must be with you for the first 24 hours after you arrive home. Please wear clothes that are easy to get on and off and wear slip-on shoes. Do not eat or drink after midnight except water with your medications that morning. Bring all your medications and current insurance cards with you.  ___ DO NOT take these medications before your procedure: ________________________________________________________________  ___ Make sure you take your aspirin.  _x__ You may take ALL of your medications with water that morning. ________________________________________________________________________________________________________________________________  ___ DO NOT take ANY medications before your procedure.  ___ Pre-med instructions:  ________________________________________________________________________________________________________________________________  The usual length of stay after your procedure is 2 to 3 hours. This can vary.  If you have any questions, please call the office at the number listed above.   Teressa Lower RN

## 2010-07-04 NOTE — Miscellaneous (Signed)
Summary: labs cbcd,pt,ptt,inr,bmp,09/13/2009  Clinical Lists Changes  Observations: Added new observation of CALCIUM: 9.3 mg/dL (16/03/9603 54:09) Added new observation of CREATININE: 0.86 mg/dL (81/19/1478 29:56) Added new observation of BUN: 14 mg/dL (21/30/8657 84:69) Added new observation of BG RANDOM: 84 mg/dL (62/95/2841 32:44) Added new observation of CO2 PLSM/SER: 24 meq/L (09/13/2009 15:54) Added new observation of CL SERUM: 103 meq/L (09/13/2009 15:54) Added new observation of K SERUM: 4.2 meq/L (09/13/2009 15:54) Added new observation of NA: 140 meq/L (09/13/2009 15:54) Added new observation of PLATELETK/UL: 319 K/uL (09/13/2009 15:54) Added new observation of MCV: 86.8 fL (09/13/2009 15:54) Added new observation of HCT: 40.2 % (09/13/2009 15:54) Added new observation of HGB: 13.2 g/dL (06/06/7251 66:44) Added new observation of WBC COUNT: 8.3 10*3/microliter (09/13/2009 15:54) Added new observation of INR: 1.00  (09/13/2009 15:54) Added new observation of PT PATIENT: 13.1 s (09/13/2009 15:54) Added new observation of PTT PATIENT: 27 s (09/13/2009 15:54)

## 2010-07-04 NOTE — Assessment & Plan Note (Signed)
Summary: 1 mth f/u per checkout on 10/05/09/tg  Medications Added DOXYCYCLINE HYCLATE 100 MG CAPS (DOXYCYCLINE HYCLATE) take 1 cap two times a day      Allergies Added: NKDA  Visit Type:  Follow-up Referring Provider:  Gerald Shaw Primary Provider:  Kari Baars, MD   History of Present Illness: [name] returns to the office as scheduled for continued assessment and treatment of coronary artery disease and presumed angina as well ashypertension and hyperlipidemia.  Since his last visit one month ago, he suffered a tick bite and developed signs and symptoms of Lyme disease.  Treatment with antibiotics was initiated resulting in symptomatic improvement.  He stills feels fatigued, but skin rash and arthritis have resolved.  He has not noted much in way of chest discomfort, but activity has been limited.  Current Medications (verified): 1)  Simvastatin 40 Mg Tabs (Simvastatin) .... Take One Tablet By Mouth Daily At Bedtime 2)  Tums 500 Mg Chew (Calcium Carbonate Antacid) .... Take 3-4 Times Per Day As Needed 3)  Nexium 40 Mg Cpdr (Esomeprazole Magnesium) .... Take 1 Tab Daily 4)  Nitrostat 0.4 Mg Subl (Nitroglycerin) .Marland Kitchen.. 1 Tablet Under Tongue At Onset of Chest Pain; You May Repeat Every 5 Minutes For Up To 3 Doses. 5)  Aspir-Trin 325 Mg Tbec (Aspirin) .... Take 1 Tab Daily 6)  Metoprolol Tartrate 50 Mg Tabs (Metoprolol Tartrate) .... Take One Tablet By Mouth Twice A Day 7)  Amlodipine Besylate 5 Mg Tabs (Amlodipine Besylate) .... Take One Tablet By Mouth Daily 8)  Doxycycline Hyclate 100 Mg Caps (Doxycycline Hyclate) .... Take 1 Cap Two Times A Day  Allergies (verified): No Known Drug Allergies  Past History:   PMH, FH, and Social History reviewed and updated.  Past Medical History: ASCVD with angina: 09/2009-100% proximal RCA; 60% mid LAD; normal EF Hyperlipidemia Hypertension-Left ventricular hypertrophy Gastroesophageal reflux disease Anxiety Disorder Arthritis;Lyme  disease in 10/2009 treated with antibiotics Chronic Headaches  Review of Systems       See history of present illness.  Vital Signs:  Patient profile:   55 year old male Weight:      201 pounds BMI:     25.90 Pulse rate:   57 / minute BP sitting:   125 / 71  (right arm)  Vitals Entered By: Timothy Saa, CNA (November 10, 2009 1:13 PM)  Physical Exam  General:  Pleasant gentleman in no acute distress; proportion in weight and height. NECK:  No jugular venous distention; normal carotid upstrokes without bruits. HEMATOPOIETIC:  No adenopathy. LUNGS:  Clear. CARDIAC:  Normal first and second heart sound; normal PMI.  Minimal systolic ejection murmur at the left sternal border ABDOMEN:  Soft and nontender; no organomegaly. EXTREMITIES:  No edema; normal distal pulses; cath site is benign.   NEUROLOGIC:  Symmetric strength and tone; normal cranial nerves.  SKIN:  healing abrasion over the left great toe   Impression & Recommendations:  Problem # 1:  ATHEROSCLEROTIC CARDIOVASCULAR DISEASE (ICD-429.2) Angina is improved, but assessment is somewhat limited due to concomitant illness.   Beta blockade appears appropriately dosed with a heart rate in the high 50s.  In the absence of symptoms, the dose of amlodipine will not be increased normal any other antianginal medications be added.  [name] is encouraged to be as active as possible as he recovers from his episode of Lyme disease.  Problem # 2:  HYPERTENSION (ICD-401.1) Blood pressure control is excellent; current medications will be continued.  Problem #  3:  HYPERLIPIDEMIA (ICD-272.4) Repeat lipid profile on medication is pending.  I will plan to reassess this nice gentleman symptoms in 2 months.  Patient Instructions: 1)  Your physician recommends that you schedule a follow-up appointment in: 2 months 2)  Your physician recommends that you continue on your current medications as directed. Please refer to the Current Medication  list given to you today.

## 2010-07-04 NOTE — Assessment & Plan Note (Signed)
Summary: FOLLOW-UP  FROM ENDO/BRAVO            Timothy Shaw    History of Present Illness Visit Type: Follow-up Visit Primary GI MD: Melvia Heaps MD [facility] Primary Provider: Kari Baars, MD Requesting Provider: NA Chief Complaint: Still c/o acid relux and esophageal burning more frequent since been on new med History of Present Illness:   55. Timothy Shaw has returned for followup of his chest discomfort.  A 54 hour pH study while on Nexium unequivocally indicated that he did not have significant acid reflux.  Nexium was discontinued and he was placed on Carafate for the possibility of bile reflux.  The patient reports frequent burning chest discomfort that may radiate into his throat in a particularly heavy meal or with exercise.  Sweets and chocolate can also bring on these symptoms.  A stress test  almost a year ago that did not demonstrate a cardiac ischemia.   GI Review of Systems    Reports acid reflux and  heartburn.      Denies abdominal pain, belching, bloating, chest pain, dysphagia with liquids, dysphagia with solids, loss of appetite, nausea, vomiting, vomiting blood, weight loss, and  weight gain.        Denies anal fissure, black tarry stools, change in bowel habit, constipation, diarrhea, diverticulosis, fecal incontinence, heme positive stool, hemorrhoids, irritable bowel syndrome, jaundice, light color stool, liver problems, rectal bleeding, and  rectal pain.    Current Medications (verified): 1)  Cozaar 100 Mg Tabs (Losartan Potassium) .Marland Kitchen.. 1 Tablet By Mouth Once Daily 2)  Crestor 5 Mg Tabs (Rosuvastatin Calcium) .Marland Kitchen.. 1 By Mouth Once Daily 3)  Tums 500 Mg Chew (Calcium Carbonate Antacid) .... Take 3-4 Times Per Day As Needed 4)  Sucralfate 1 Gm Tabs (Sucralfate) .... Take One By Mouth 4 Times Daily. Take  1 Hour Before or 2 Hours After Meals/meds.  Allergies (verified): No Known Drug Allergies  Past History:  Past Medical History: Last updated: 12/09/2008 GERD  (ICD-530.81) Anxiety Disorder Arthritis Chronic Headaches Hyperlipidemia Hypertension  Past Surgical History: Last updated: 04/04/2009 Hernia Surgery X 2 Appendectomy Tonsillectomy Sinus surgery right knee surgery   Family History: Last updated: 04/04/2009 Family History of Colon Cancer: sister age 53 Lupus-sister Family History of Diabetes: sister Family History of Heart Disease: father  Social History: Last updated: 04/04/2009 Married 1 girl Occupation:  Estate manager/land agent Patient is a former smoker. quit 20 years ago Alcohol Use - yes  occasionally rare Daily Caffeine Use  2 per day Illicit Drug Use - no  Review of Systems       The patient complains of allergy/sinus, arthritis/joint pain, and back pain.  The patient denies anemia, anxiety-new, breast changes/lumps, change in vision, confusion, cough, coughing up blood, depression-new, fainting, fatigue, fever, headaches-new, hearing problems, heart murmur, heart rhythm changes, itching, muscle pains/cramps, night sweats, nosebleeds, shortness of breath, skin rash, sleeping problems, sore throat, swelling of feet/legs, swollen lymph glands, thirst - excessive, urination - excessive, urination changes/pain, urine leakage, vision changes, and voice change.    Vital Signs:  Patient profile:   55 year old male Height:      74 inches Weight:      216 pounds BMI:     27.83 Pulse rate:   60 / minute Pulse rhythm:   regular BP sitting:   130 / 82  (left arm)  Vitals Entered By: Milford Cage NCMA (June 08, 2009 9:22 AM)   Impression & Recommendations:  Problem # 1:  GERD (ICD-530.81) While symptoms of chest burning have been attributed to esophageal reflux symptom,s despite absence of acid reflux as demonstrated by a 48 hour study,  raises  the question of cardiac pain.  He predictably will develop  burning chest discomfort after a heavy meal and with exercise.  Recommendations #1 resume Nexium b.i.d. while  continuing Carafate.  I carefully instructed the patient to call me an approximate one week to  report his progress.  If he still has chest discomfort in the face of this regimen I will refer him back to cardiology for consideration of further workup.  Patient Instructions: 1)  cc Dr. Kari Baars

## 2010-07-04 NOTE — Miscellaneous (Signed)
Summary: LABS CBCD,PT,PTT,INRBMP,09/13/2009  Clinical Lists Changes  Observations: Added new observation of CALCIUM: 9.3 mg/dL (75/64/3329 51:88) Added new observation of CREATININE: 0.86 mg/dL (41/66/0630 16:01) Added new observation of BUN: 14 mg/dL (09/32/3557 32:20) Added new observation of BG RANDOM: 84 mg/dL (25/42/7062 37:62) Added new observation of CO2 PLSM/SER: 24 meq/L (09/13/2009 10:23) Added new observation of CL SERUM: 103 meq/L (09/13/2009 10:23) Added new observation of K SERUM: 4.2 meq/L (09/13/2009 10:23) Added new observation of NA: 140 meq/L (09/13/2009 10:23) Added new observation of PLATELETK/UL: 319 K/uL (09/13/2009 10:23) Added new observation of MCV: 86.8 fL (09/13/2009 10:23) Added new observation of HCT: 40.2 % (09/13/2009 10:23) Added new observation of HGB: 13.2 g/dL (83/15/1761 60:73) Added new observation of WBC COUNT: 8.3 10*3/microliter (09/13/2009 10:23) Added new observation of INR POC: 1.00  (09/13/2009 10:23) Added new observation of PT PATIENT: 13.1 s (09/13/2009 10:23) Added new observation of PTT PATIENT: 27 s (09/13/2009 10:23)

## 2010-07-04 NOTE — Letter (Signed)
Summary: [place] Treadmill (Nuc Med Stress)  [facility]  [address] [place] [address]   Phone: [phone]  Fax: [phone]    Nuclear Medicine 1-Day Stress Test Information Sheet  Re:     Timothy Shaw   DOB:     [birth year] MRN:     [id number] Weight:  Appointment Date: Register at: Appointment Time: Referring MD:  _x__Exercise Stress  __Adenosine   __Dobutamine  __Lexiscan  __Persantine   __Thallium  Urgency: ____1 (next day)   ____2 (one week)    ____3 (PRN)  Patient will receive Follow Up call with results: Patient needs follow-up appointment:  Instructions regarding medication:  How to prepare for your stress test: 1. DO NOT eat or dring 6 hours prior to your arrival time. This includes no caffeine (coffee, tea, sodas, chocolate) if you were instructed to take your medications, drink water with it. 2. DO NOT use any tobacco products for at leaset 8 hours prior to arrival. 3. DO NOT wear dresses or any clothing that may have metal clasps or buttons. 4. Wear short sleeve shirts, loose clothing, and comfortalbe walking shoes. 5. DO NOT use lotions, oils or powder on your chest before the test. 6. The test will take approximately 3-4 hours from the time you arrive until completion. 7. To register the day of the test, go to the Short Stay entrance at [facility]. 8. If you must cancel your test, call [phone] as soon as you are aware.  After you arrive for test:   When you arrive at [facility], you will go to Short Stay to be registered. They will then send you to Radiology to check in. The Nuclear Medicine Tech will get you and start an IV in your arm or hand. A small amount of a radioactive tracer will then be injected into your IV. This tracer will then have to circulate for 30-45 minutes. During this time you will wait in the waiting room and you will be able to drink something without caffeine. A series of pictures will be taken  of your heart follwoing this waiting period. After the 1st set of pictures you will go to the stress lab to get ready for your stress test. During the stress test, another small amount of a radioactive tracer will be injected through your IV. When the stress test is complete, there is a short rest period while your heart rate and blood pressure will be monitored. When this monitoring period is complete you will have another set of pictrues taken. (The same as the 1st set of pictures). These pictures are taken between 15 minutes and 1 hour after the stress test. The time depends on the type of stress test you had. Your doctor will inform you of your test results within 7 days after test.    The possibilities of certain changes are possible during the test. They include abnormal blood pressure and disorders of the heart. Side effects of persantine or adenosine can include flushing, chest pain, shortness of breath, stomach tightness, headache and light-headedness. These side effects usually do not last long and are self-resolving. Every effort will be made to keep you comfortable and to minimize complications by obtaining a medical history and by close observation during the test. Emergency equipment, medications, and trained personnel are available to deal with any unusual situation which may arise.  Please notify office at least 48 hours in advance if you are unable to keep  this appt.

## 2010-07-04 NOTE — Letter (Signed)
Summary: [place] Results Engineer, agricultural at [facility]  [address], [place] [address]   Phone: [phone]  Fax: [phone]      September 15, 2009 MRN: [id number]   [facility] [address] [place], [place]  [address]   Dear Mr. Neitzke,  Your test ordered by Selena Batten has been reviewed by your physician (or physician assistant) and was found to be normal or stable. Your physician (or physician assistant) felt no changes were needed at this time.  ____ Echocardiogram  ____ Cardiac Stress Test  _x___ Lab Work  ____ Peripheral vascular study of arms, legs or neck  ____ CT scan or X-ray  ____ Lung or Breathing test  ____ Other:  No change in medical treatment at this time, per [name].  Enclosed is a copy of your labwork for your records.  Proceed with your catherization as planned.  Thank you, Keshayla Schrum Allyne Gee RN    [place] Bing, MD, Lenise Arena.C.Gaylord Shih, MD, F.A.C.C Lewayne Bunting, MD, F.A.C.C Nona Dell, MD, F.A.C.C Charlton Haws, MD, Lenise Arena.C.C

## 2010-07-04 NOTE — Assessment & Plan Note (Signed)
Summary: 2 mth f/u per checkout on 11/09/09/tg  Medications Added PRAVASTATIN SODIUM 80 MG TABS (PRAVASTATIN SODIUM) take 1 tablet by mouth at bedtime NORVASC 10 MG TABS (AMLODIPINE BESYLATE) take 1 tablet by mouth once daily IMDUR 120 MG XR24H-TAB (ISOSORBIDE MONONITRATE) Take 1/2 tablet by mouth once daily X 1 week then (if doing well) increase to 1 whole tablet once daily      Allergies Added: NKDA  Visit Type:  Follow-up Referring Provider:  Gerald Leitz Primary Provider:  Kari Baars, MD   History of Present Illness: [name] has done fairly well since I last saw him 2 months ago.  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  He experiences upper anterior chest burning with mild associated dyspnea when he excessively exerts himself.  This resolves promptly with sublingual nitroglycerin or fairly promptly with rest.  There is no associated diaphoresis, nausea nor emesis.  He rarely uses nitroglycerin.  The occurrence of symptoms is somewhat unpredictable, noted more frequently when he has slept poorly the night before or when he is fatigued in general.  EKG  Procedure date:  01/17/2010  Findings:      Sinus bradycardia at a rate of 54 bpm Early repolarization Slightly delayed R-wave progression Prominent QRS voltage Otherwise within normal limits. No change when compared to a previous tracing of 06/11/2008.  -  Date:  01/03/2010    LDL-calculated: 82    Triglycerides: 70   Current Medications (verified): 1)  Pravastatin Sodium 80 Mg Tabs (Pravastatin Sodium) .... Take 1 Tablet By Mouth At Bedtime 2)  Tums 500 Mg Chew (Calcium Carbonate Antacid) .... Take 3-4 Times Per Day As Needed 3)  Nexium 40 Mg Cpdr (Esomeprazole Magnesium) .... Take 1 Tab Daily 4)  Nitrostat 0.4 Mg Subl (Nitroglycerin) .Marland Kitchen.. 1 Tablet Under Tongue At Onset of Chest Pain; You May Repeat Every 5 Minutes For Up To  3 Doses. 5)  Aspir-Trin 325 Mg Tbec (Aspirin) .... Take 1 Tab Daily 6)  Metoprolol Tartrate 50 Mg Tabs (Metoprolol Tartrate) .... Take One Tablet By Mouth Twice A Day 7)  Norvasc 10 Mg Tabs (Amlodipine Besylate) .... Take 1 Tablet By Mouth Once Daily 8)  Imdur 120 Mg Xr24h-Tab (Isosorbide Mononitrate) .... Take 1/2 Tablet By Mouth Once Daily X 1 Week Then (If Doing Well) Increase To 1 Whole Tablet Once Daily  Allergies (verified): No Known Drug Allergies  Past History:  PMH, FH, and Social History reviewed and updated.  Review of Systems       The patient complains of chest pain and dyspnea on exertion.  The patient denies weight loss, weight gain, hoarseness, syncope, peripheral edema, prolonged cough, headaches, hemoptysis, and abdominal pain.    Vital Signs:  Patient profile:   55 year old male Weight:      205 pounds Pulse rate:   56 / minute BP sitting:   110 / 65  (right arm)  Vitals Entered By: Dreama Saa, CNA (January 17, 2010 2:57 PM)  Physical Exam  General:  Pleasant gentleman in no acute distress; proportion in weight and height. NECK:  No jugular venous distention; normal carotid upstrokes without bruits. HEMATOPOIETIC:  No adenopathy. LUNGS:  Clear. CARDIAC:  Normal first and second heart sound; normal PMI.  Minimal systolic ejection murmur at the left sternal border ABDOMEN:  Soft and nontender; no organomegaly. EXTREMITIES:  No edema; normal distal pulses; cath site is benign.   NEUROLOGIC:  Symmetric strength and tone; normal cranial nerves.  SKIN:  healing abrasion over the left great toe   Impression & Recommendations:  Problem # 1:  ATHEROSCLEROTIC CARDIOVASCULAR DISEASE (ICD-429.2) Patient continues to have symptoms compatible with exertional angina.  His functional status and quality of life are relatively good at present.  I suspect he will not require CABG surgery.  Medical therapy will be increased by raising the dose of amlodipine to 10 mg q.d.  and adding isosorbide mononitrate, initially at a dose of 60 mg per day and then 120 mg q. day.  Problem # 2:  HYPERTENSION (ICD-401.1) Blood pressure control is excellent on 2 antihypertensive medications, which will be continued.  With an increase in his dose of amlodipine, blood pressure control should be even better.  Problem # 3:  HYPERLIPIDEMIA (ICD-272.4) Lipid profile a few months ago was quite good with total cholesterol of 142, triglycerides 70, HDL of 46 and LDL of 82.  Unfortunately, moderate dose simvastatin is incompatible with amlodipine.  Pravastatin will be substituted at a dose of 80 mg q.d. and lipid profile repeated.  I will plan to reassess Timothy Shaw's symptoms in one month.  Other Orders: Future Orders: T-Lipid Profile ([id number]) ... 03/20/2010  Patient Instructions: 1)  Your physician recommends that you schedule a follow-up appointment in: 1 month 2)  Your physician has recommended you make the following change in your medication: increase Norvasc (amlodipine) to 10mg  by mouth once daily,  start taking Imdur 60mg  by mouth once daily X 1 week then, if doing well, increase to 120mg  and change Simvastatin to Pravastatin 80mg  by mouth at bedtime  Prescriptions: PRAVASTATIN SODIUM 80 MG TABS (PRAVASTATIN SODIUM) take 1 tablet by mouth at bedtime  #30 x 3   Entered by:   Larita Fife Via LPN   Authorized by:   Kathlen Brunswick, MD, [facility]   Signed by:   Larita Fife Via LPN on 09/81/1914   Method used:   Electronically to        [facility] (retail)       [address]       [place], [place]  [address]       Ph: [phone]       Fax: [phone]   RxID:   [id number] IMDUR 120 MG XR24H-TAB (ISOSORBIDE MONONITRATE) Take 1/2 tablet by mouth once daily X 1 week then (if doing well) increase to 1 whole tablet once daily  #30 x 3   Entered by:   Larita Fife Via LPN   Authorized by:   Kathlen Brunswick, MD, [facility]   Signed by:   Larita Fife Via LPN on 40/34/7425    Method used:   Electronically to        [facility] (retail)       [address]       [place], [place]  [address]       Ph: [phone]       Fax: [phone]   RxID:   [id number] NORVASC 10 MG TABS (AMLODIPINE BESYLATE) take 1 tablet by mouth once daily  #30 x 3   Entered by:   Larita Fife Via LPN   Authorized by:   Kathlen Brunswick, MD, [facility]   Signed by:   Larita Fife Via LPN on 25/42/7062   Method used:   Electronically to        [facility] (retail)       [address]       [place], [place]  [address]       Ph: [phone]       Fax: [phone]   RxID:   [id number]

## 2010-07-04 NOTE — Letter (Signed)
Summary: [place] Results Engineer, agricultural at [facility]  [address] [place] [address]   Phone: [phone]  Fax: [phone]      February 24, 2010 MRN: [id number]   [facility] [address] [place], [place]  [address]   Dear Mr. Sonneborn,  Your test ordered by Selena Batten has been reviewed by your physician (or physician assistant) and was found to be normal or stable. Your physician (or physician assistant) felt no changes were needed at this time.  ____ Echocardiogram  ____ Cardiac Stress Test  __x__ Lab Work  ____ Peripheral vascular study of arms, legs or neck  ____ CT scan or X-ray  ____ Lung or Breathing test  ____ Other:  No change in medical treatment at this time, per [name].  Enclosed is a copy of your labwork for your records.  Thank you, Tammy Allyne Gee RN    [place] Bing, MD, Lenise Arena.C.Gaylord Shih, MD, F.A.C.C Lewayne Bunting, MD, F.A.C.C Nona Dell, MD, F.A.C.C Charlton Haws, MD, Lenise Arena.C.C

## 2010-07-04 NOTE — Miscellaneous (Signed)
Summary: labs cmp,lipid,01/03/2010  Clinical Lists Changes  Observations: Added new observation of CALCIUM: 9.5 mg/dL (16/03/9603 54:09) Added new observation of ALBUMIN: 4.4 g/dL (81/19/1478 29:56) Added new observation of PROTEIN, TOT: 7.0 g/dL (21/30/8657 84:69) Added new observation of SGPT (ALT): 19 units/L (01/03/2010 16:46) Added new observation of SGOT (AST): 18 units/L (01/03/2010 16:46) Added new observation of ALK PHOS: 57 units/L (01/03/2010 16:46) Added new observation of CREATININE: 1.01 mg/dL (62/95/2841 32:44) Added new observation of BUN: 20 mg/dL (06/06/7251 66:44) Added new observation of BG RANDOM: 110 mg/dL (03/47/4259 56:38) Added new observation of CO2 PLSM/SER: 22 meq/L (01/03/2010 16:46) Added new observation of CL SERUM: 106 meq/L (01/03/2010 16:46) Added new observation of K SERUM: 4.7 meq/L (01/03/2010 16:46) Added new observation of NA: 139 meq/L (01/03/2010 16:46) Added new observation of LDL: 82 mg/dL (75/64/3329 51:88) Added new observation of HDL: 46 mg/dL (41/66/0630 16:01) Added new observation of TRIGLYC TOT: 70 mg/dL (09/32/3557 32:20) Added new observation of CHOLESTEROL: 142 mg/dL (25/42/7062 37:62)

## 2010-07-04 NOTE — Letter (Signed)
Summary: [place] Future Lab Work Engineer, agricultural at [facility]  [address] [place] [address]   Phone: [phone]  Fax: [phone]     February 16, 2010 MRN: [id number]   [facility] [address] [place], [place]  [address]      YOUR LAB WORK IS DUE   February 20, 2010  Please go to [facility], located across the street from [facility] on the second floor.  Hours are Monday - Friday 7am until 7:30pm         Saturday 8am until 12noon    _x_  DO NOT EAT OR DRINK AFTER MIDNIGHT EVENING PRIOR TO LABWORK  __ YOUR LABWORK IS NOT FASTING --YOU MAY EAT PRIOR TO LABWORK

## 2010-07-04 NOTE — Letter (Signed)
Summary: [place] Results Engineer, agricultural at [facility]  [address], [place] [address]   Phone: [phone]  Fax: [phone]      March 23, 2010 MRN: [id number]   [facility] [address] [place], [place]  [address]   Dear [name],  Your test ordered by Selena Batten has been reviewed by your physician (or physician assistant) and was found to be normal or stable. Your physician (or physician assistant) felt no changes were needed at this time.  ____ Echocardiogram  ____ Cardiac Stress Test  _x___ Lab Work  ____ Peripheral vascular study of arms, legs or neck  ____ CT scan or X-ray  ____ Lung or Breathing test  ____ Other:  No change in medical treatment at this time, per [name].  Enclosed is a copy of your labwork for your records.  Thank you, Tammy Allyne Gee RN    Keith Bing, MD, Lenise Arena.C.Gaylord Shih, MD, F.A.C.C Lewayne Bunting, MD, F.A.C.C Nona Dell, MD, F.A.C.C Charlton Haws, MD, Lenise Arena.C.C

## 2010-07-04 NOTE — Assessment & Plan Note (Signed)
Summary: Pre-cath Note    Visit Type:  Follow-up Referring Provider:  Gerald Leitz Primary Provider:  Kari Baars, MD   History of Present Illness: Return visit for this pleasant 55 year old gentleman for continuing evaluation of chest discomfort.  A stress echocardiogram performed last year was negative.  He continues to experience burning upper substernal chest discomfort associated with neck discomfort occurring with moderate exercise and promptly relieved with rest.  He has taken nitroglycerin over the past week or 2 on a few occasions.  There was no notable increase in the rapidity with which symptoms disappeared nor was there benefit in terms of any delay in symptoms reoccurring when he resumed exercise.   These symptoms are distinct from his usual gastroesophageal reflux disease.  Allergies: No Known Drug Allergies  Past History:  Past Medical History: Last updated: 08/15/2009 Hyperlipidemia Hypertension-Left ventricular hypertrophy Chest discomfort Gastroesophageal reflux disease Anxiety Disorder Arthritis Chronic Headaches  Past Surgical History: Last updated: 08/16/2009 Herniorrhaphy-umbilical Appendectomy Tonsillectomy Sinus surgery Laparoscopic right knee surgery  Family History: Last updated: 08/15/2009 Father-history of congestive heart failure; permanent pacemaker Mother-no chronic diseases Siblings-3 living; history of lupus, diabetes and carcinoma of the colon  Social History: Last updated: 04/04/2009 Married 1 girl Occupation:  Estate manager/land agent Patient is a former smoker. quit 20 years ago Alcohol Use - yes  occasionally rare Daily Caffeine Use  2 per day Illicit Drug Use - no  Physical Exam  General:  Pleasant proportionate gentleman in no acute distress. Weight is 215 pounds, 2 pounds less than at his last visit NECK:  No jugular venous distention; normal carotid upstrokes without bruits. HEMATOPOIETIC:  No adenopathy. LUNGS:   Clear. CARDIAC:  Normal first and second heart sound; normal PMI. ABDOMEN:  Soft and nontender; no organomegaly. EXTREMITIES:  No edema; normal distal pulses. NEUROLOGIC:  Symmetric strength and tone; normal cranial nerves.  SKIN:  no significant lesions.   Impression & Recommendations:  Problem # 1:  CHEST PAIN (ICD-786.50) A stress nuclear study was requested; however, patient's insurance company refused to authorize that procedure prior to a standard treadmill test.  He exercised to a work load of 9 mets and a heart rate of 120, 75% of age-predicted maximum.  He developed his typical chest burning as well as some fatigue and dyspnea prompting early cessation of exercise.  Electrocardiogram showed 1.9-2.2 mm of upsloping ST segment depression in the inferior leads.  Symptoms resolve probably with rest.  Blood pressure response was normal.  No significant arrhythmias occurred.  Although the electrocardiographic response was somewhat equivocal, this study does constitute a positive stress test in a patient with classic angina.  The likelihood of coronary disease or syndrome X. is high, and cardiac catheterization is warranted.  A prolonged discussion with the patient regarding the above-noted facts, reviewed the risks of catheterization and the potential benefits.  He understands that the procedure could result in myocardial infarction, stroke or death and agrees to proceed.  An outpatient procedure will be scheduled at his earliest convenience.

## 2010-07-04 NOTE — Assessment & Plan Note (Signed)
  Nurse Visit   Preventive Screening-Counseling & Management  Comments: Mr. Kurek will be screened for the Xenoport study on 12/17/2008.  Allergies: No Known Drug Allergies

## 2010-07-04 NOTE — Letter (Signed)
Summary: [place] Future Lab Work Engineer, agricultural at [facility]  [address], [place] [address]   Phone: [phone]  Fax: [phone]     Oct 06, 2009 MRN: [id number]   [facility] [address] [place], [place]  [address]      YOUR LAB WORK IS DUE  January 06, 2010 _________________________________________  Please go to [facility], located across the street from [facility] on the second floor.  Hours are Monday - Friday 7am until 7:30pm         Saturday 8am until 12noon    _X_  DO NOT EAT OR DRINK AFTER MIDNIGHT EVENING PRIOR TO LABWORK  __ YOUR LABWORK IS NOT FASTING --YOU MAY EAT PRIOR TO LABWORK

## 2010-07-04 NOTE — Letter (Signed)
Summary: Cardiac Catheterization Instructions- [facility] Lab  [facility]  [address], [place] [address]   Phone: [phone]  Fax: [phone]     09/12/2009 MRN: [id number]  [facility] [address] [place], [place]  [address]  Dear Mr. Saephan,   You are scheduled for a Cardiac Catheterization on 04-18-2011with Dr.Stuckey or Dr. Excell Seltzer  Please arrive to the 1st floor of the Heart and Vascular Center at [facility] at 7:30 am on the day of your procedure. Please do not arrive before 6:30 a.m. Call the Heart and Vascular Center at [phone] if you are unable to make your appointmnet. The Code to get into the parking garage under the building is 0001. Take the elevators to the 1st floor. You must have someone to drive you home. Someone must be with you for the first 24 hours after you arrive home. Please wear clothes that are easy to get on and off and wear slip-on shoes. Do not eat or drink after midnight except water with your medications that morning. Bring all your medications and current insurance cards with you.     ___ You may take ALL of your medications with water that morning. ________________________________________________________________________________________________________________________________    _X__ Eilene Ghazi instructions:  ________________________________________________________________________________________________________________________________  The usual length of stay after your procedure is 2 to 3 hours. This can vary.  If you have any questions, please call the office at the number listed above.   Larita Fife Via LPN

## 2010-07-04 NOTE — Miscellaneous (Signed)
Summary: Orders Update  Clinical Lists Changes  Problems: Added new problem of PREOPERATIVE EXAMINATION (ICD-V72.84) Orders: Added new Test order of T-CBC w/Diff [id number]) - Signed Added new Test order of T-Basic Metabolic Panel [phone]) - Signed Added new Test order of T-PTT ([id number]) - Signed Added new Test order of T-Protime, Auto ([id number]) - Signed Added new Referral order of Cardiac Catheterization (Cardiac Cath) - Signed

## 2010-07-04 NOTE — Progress Notes (Signed)
Summary: Condition Update   Phone Note Call from Patient Call back at Home Phone [phone]   Call For: Dr Arlyce Dice Reason for Call: Talk to Nurse Summary of Call: Condition Update Initial call taken by: Leanor Kail [facility],  June 21, 2009 11:47 AM  Follow-up for Phone Call        Pt. was seen 06-08-09. Was instructed to continue Nexium two times a day and Carafate QID AC. Was to call with an update in 1 week, is calling now. Pt. states he continues with chest pain/burning after he eats and then does physical activity. He usually waits for 1 hour after eating to do activity. Follow-up by: Laureen Ochs LPN,  June 21, 2009 12:32 PM  Additional Follow-up for Phone Call Additional follow up Details #1::        Instruct him to see cardiology again for reevaluation of persistent chest pain Additional Follow-up by: Louis Meckel MD,  June 22, 2009 1:01 PM    Additional Follow-up for Phone Call Additional follow up Details #2::    Message left for patient to callback.Laureen Ochs LPN  June 22, 2009 1:06 PM   Above MD orders reviewed with patient. Pt. states he will make an appt. to see Dr.Rothbart in the [place] office. Pt. instructed to call back as needed.  Follow-up by: Laureen Ochs LPN,  June 22, 2009 3:09 PM

## 2010-07-04 NOTE — Letter (Signed)
Summary: [facility] [employer], agricultural at [employer]  [address] [place] [address]   Phone: [phone]  Fax: [phone]     January 18, 2010 MRN: [id number]   [facility] [address] [place], [place]  [address]      YOUR LAB WORK IS DUE  March 20, 2010 _________________________________________  Please go to [facility], located across the street from [facility] on the second floor.  Hours are Monday - Friday 7am until 7:30pm         Saturday 8am until 12noon    __  DO NOT EAT OR DRINK AFTER MIDNIGHT EVENING PRIOR TO LABWORK  __ YOUR LABWORK IS NOT FASTING --YOU MAY EAT PRIOR TO LABWORK

## 2010-07-04 NOTE — Assessment & Plan Note (Signed)
Summary: 4-6 WK F/U PER CHECKOUT ON 02/16/10/TG  Medications Added PREDNISONE 10 MG TABS (PREDNISONE) take 5 tabs today FLEXERIL 10 MG TABS (CYCLOBENZAPRINE HCL) take 1 tab daily      Allergies Added: NKDA  Visit Type:  Follow-up Referring Koa Palla:  Timothy Shaw Primary Timothy Shaw:  Timothy Baars, MD   History of Present Illness: [name] returns to the office as scheduled for continued assessment and treatment of single vessel coronary artery disease, manifesting as total obstruction of the right coronary.  He continues to be fairly active, but has not established an exercise routine.  He performs yard work including using a Nurse, children's and walks his dog up to 2 miles per day.  He currently has no definite cardiac symptoms, but sometimes he experiences a sense of indigestion in the upper chest without associated symptoms.  This resolves promptly with sublingual nitroglycerin or with rest.  He denies orthopnea, PND, pedal edema, lightheadedness or syncope.  He believes that reduction of his dose of isosorbide mononitrate has resulted in an improved sense of well-being.  He is monitoring blood pressures at home with systolics varying between 120 and 140 and diastolics all below 85.  Current Medications (verified): 1)  Pravastatin Sodium 80 Mg Tabs (Pravastatin Sodium) .... Take 1 Tablet By Mouth At Bedtime 2)  Tums 500 Mg Chew (Calcium Carbonate Antacid) .... Take 3-4 Times Per Day As Needed 3)  Nexium 40 Mg Cpdr (Esomeprazole Magnesium) .... Take 1 Tab Daily 4)  Nitrostat 0.4 Mg Subl (Nitroglycerin) .Marland Kitchen.. 1 Tablet Under Tongue At Onset of Chest Pain; You May Repeat Every 5 Minutes For Up To 3 Doses. 5)  Aspir-Trin 325 Mg Tbec (Aspirin) .... Take 1 Tab Daily 6)  Metoprolol Tartrate 50 Mg Tabs (Metoprolol Tartrate) .... Take One Tablet By Mouth Twice A Day 7)  Norvasc 10 Mg Tabs (Amlodipine Besylate) .... Take 1 Tablet By Mouth Once Daily 8)  Isosorbide Mononitrate Cr 60 Mg  Xr24h-Tab (Isosorbide Mononitrate) .... Take One Tablet By Mouth Daily  Allergies (verified): No Known Drug Allergies  Comments:  Nurse/Medical Assistant: patient went home to get bottles also reviewed list with patient stated meds are correct he was confused about meds so that is reason he went home to get bottles  Past History:  PMH, FH, and Social History reviewed and updated.  Past Surgical History: Herniorrhaphy-umbilical Appendectomy Tonsillectomy Sinus surgery Laparoscopic right knee surgery Colonoscopy-2008  Review of Systems       See history of present illness.  Vital Signs:  Patient profile:   55 year old male Weight:      200 pounds BMI:     25.77 Pulse rate:   64 / minute BP sitting:   121 / 71  (right arm)  Vitals Entered By: Dreama Saa, CNA (March 23, 2010 11:38 AM)  Physical Exam  General:  Proportionate height and weight; pleasant gentleman in no acute distress;  NECK:  No jugular venous distention; normal carotid upstrokes without bruits. LUNGS:  Clear. CARDIAC:  Normal first and second heart sound; normal PMI.  grade 1/6 systolic ejection murmur at the left sternal border ABDOMEN:  Soft and nontender; no organomegaly. EXTREMITIES:  trace ankle edema; normal distal pulses  NEUROLOGIC:  Symmetric strength and tone; normal cranial nerves.  SKIN: no significant lesions   Impression & Recommendations:  Problem # 1:  ATHEROSCLEROTIC CARDIOVASCULAR DISEASE (ICD-429.2) Angina has improved with adjustment of his medical therapy, but is not totally relieved.  I spent approximately 20  minutes providing teaching regarding coronary disease and resultant chest discomfort.  We discussed the warmup effect; accordingly, he will attempt to resume activity after an episode of angina subsides.  We discussed the relative benefits of nitrates and amlodipine.  He will attempt to resume 120 mg q.d. of isosorbide mononitrate.  If symptoms improve, he'll continue that  dosage; otherwise, he will reduce back to 60 mg q.d.  We discussed the pros and cons of ranolazine.  At this point, we feel that his symptoms are minor enough so that additional medical therapy or consideration of percutaneous intervention are not necessary.  I will reassess this nice gentleman in 6 months.  Patient Instructions: 1)  Your physician recommends that you schedule a follow-up appointment in: 6 months 2)  Your physician has recommended you make the following change in your medication: try to increase imdur to 120mg  daily if fell better continue, if you feel worse decrease back to 60mg  dialy

## 2010-07-04 NOTE — Miscellaneous (Signed)
Summary: labs lipids,02/23/2010  Clinical Lists Changes  Observations: Added new observation of LDL: 78 mg/dL (16/03/9603 5:40) Added new observation of HDL: 43 mg/dL (98/04/9146 8:29) Added new observation of TRIGLYC TOT: 97 mg/dL (56/21/3086 5:78) Added new observation of CHOLESTEROL: 140 mg/dL (46/96/2952 8:41)

## 2010-07-04 NOTE — Assessment & Plan Note (Signed)
Summary: 1 mth f/u per checkout on 01/17/10/tg  Medications Added ISOSORBIDE MONONITRATE CR 60 MG XR24H-TAB (ISOSORBIDE MONONITRATE) Take one tablet by mouth daily      Allergies Added: NKDA  Visit Type:  Follow-up Referring Shardae Kleinman:  Gerald Leitz Primary Ladean Steinmeyer:  Kari Baars, MD   History of Present Illness: [name] has done well since his last visit with essentially no cardiopulmonary symptoms.  He has used nitroglycerin on only once.  He has developed intermittent dizziness, frequently orthostatic in pattern.  He has not experienced loss of consciousness or falls, which could be dangerous since he frequently works on Paediatric nurse.  Blood pressure has not been monitored at home.  He developed headaches initially with isosorbide mononitrate, but was cutting a 120 mg capsule in half.  Since starting the higher dose, headaches have resolved.  Current Medications (verified): 1)  Pravastatin Sodium 80 Mg Tabs (Pravastatin Sodium) .... Take 1 Tablet By Mouth At Bedtime 2)  Tums 500 Mg Chew (Calcium Carbonate Antacid) .... Take 3-4 Times Per Day As Needed 3)  Nexium 40 Mg Cpdr (Esomeprazole Magnesium) .... Take 1 Tab Daily 4)  Nitrostat 0.4 Mg Subl (Nitroglycerin) .Marland Kitchen.. 1 Tablet Under Tongue At Onset of Chest Pain; You May Repeat Every 5 Minutes For Up To 3 Doses. 5)  Aspir-Trin 325 Mg Tbec (Aspirin) .... Take 1 Tab Daily 6)  Metoprolol Tartrate 50 Mg Tabs (Metoprolol Tartrate) .... Take One Tablet By Mouth Twice A Day 7)  Norvasc 10 Mg Tabs (Amlodipine Besylate) .... Take 1 Tablet By Mouth Once Daily 8)  Isosorbide Mononitrate Cr 60 Mg Xr24h-Tab (Isosorbide Mononitrate) .... Take One Tablet By Mouth Daily  Allergies (verified): No Known Drug Allergies  Past History:  PMH, FH, and Social History reviewed and updated.  Social History: Married; one daughter Occupation:  Estate manager/land agent Patient is a former smoker. quit 20 years ago Alcohol Use - yes  occasionally rare Daily  Caffeine Use  2 per day Illicit Drug Use - no  Review of Systems       See history of present illness.  Vital Signs:  Patient profile:   55 year old male Weight:      200 pounds BMI:     25.77 Pulse rate:   60 / minute BP sitting:   95 / 54  (right arm)  Vitals Entered By: Dreama Saa, CNA (February 16, 2010 1:12 PM)  Physical Exam  General:  Proportionate height and weight; pleasant gentleman in no acute distress;  NECK:  No jugular venous distention; normal carotid upstrokes without bruits. HEMATOPOIETIC:  No adenopathy. LUNGS:  Clear. CARDIAC:  Normal first and second heart sound; normal PMI.  grade 1/6l systolic ejection murmur at the left sternal border ABDOMEN:  Soft and nontender; no organomegaly. EXTREMITIES:  trace left ankle edema; normal distal pulses  NEUROLOGIC:  Symmetric strength and tone; normal cranial nerves.  SKIN: no significant lesions   Impression & Recommendations:  Problem # 1:  ATHEROSCLEROTIC CARDIOVASCULAR DISEASE (ICD-429.2) Patient currently appears to have few, if any, symptoms likely to reflect myocardial ischemia; however, he may be developing relative hypotension and mild cerebral hypoperfusion.  His dose of isosorbide mononitrate will be reduced to 60 mg q.d.  If symptoms persist his dose of beta blocker will be decreased to determine if this improves his nonspecific fatigue.  Problem # 2:  HYPERTENSION (ICD-401.1) Blood pressure is on the low side; isosorbide mononitrate dosage will be decreased as noted above.  Patient is cautioned not to  place himself in potentially compromising circumstances when he is not feeling well, particularly if he is dizzy.  Problem # 3:  HYPERLIPIDEMIA (ICD-272.4) Control was excellent as noted below, but agent has been changed from simvastatin to pravastatin.  A repeat lipid profile will be obtained.  CHOL: 142 (01/03/2010)   LDL: 82 (01/03/2010)   HDL: 46 (01/03/2010)   TG: 70 (01/03/2010)  I will plan to  see this nice to him again in 4-6 weeks.  Other Orders: Future Orders: T-Lipid Profile ([id number]) ... 02/20/2010  Patient Instructions: 1)  Your physician recommends that you schedule a follow-up appointment in: 4-6 weeks 2)  Your physician recommends that you return for lab work in: next week  3)  Your physician has recommended you make the following change in your medication:  decrease imdur to 60mg  daily Prescriptions: ISOSORBIDE MONONITRATE CR 60 MG XR24H-TAB (ISOSORBIDE MONONITRATE) Take one tablet by mouth daily  #30 x 3   Entered by:   Teressa Lower RN   Authorized by:   Kathlen Brunswick, MD, [facility]   Signed by:   Teressa Lower RN on 02/16/2010   Method used:   Electronically to        [facility] (retail)       [address]       [place], [place]  [address]       Ph: [phone]       Fax: [phone]   RxID:   [id number]

## 2010-08-31 ENCOUNTER — Other Ambulatory Visit (HOSPITAL_COMMUNITY): Payer: Self-pay | Admitting: Orthopaedic Surgery

## 2010-08-31 DIAGNOSIS — M25561 Pain in right knee: Secondary | ICD-10-CM

## 2010-09-08 ENCOUNTER — Other Ambulatory Visit: Payer: Self-pay | Admitting: Cardiology

## 2010-09-08 ENCOUNTER — Ambulatory Visit (HOSPITAL_COMMUNITY)
Admission: RE | Admit: 2010-09-08 | Discharge: 2010-09-08 | Disposition: A | Discharge status: Home / Self Care | Payer: PRIVATE HEALTH INSURANCE | Source: Ambulatory Visit | Attending: Orthopaedic Surgery | Admitting: Orthopaedic Surgery

## 2010-09-08 DIAGNOSIS — M659 Unspecified synovitis and tenosynovitis, unspecified site: Secondary | ICD-10-CM | POA: Insufficient documentation

## 2010-09-08 DIAGNOSIS — M25561 Pain in right knee: Secondary | ICD-10-CM

## 2010-09-08 DIAGNOSIS — M25569 Pain in unspecified knee: Secondary | ICD-10-CM | POA: Insufficient documentation

## 2010-09-08 DIAGNOSIS — R937 Abnormal findings on diagnostic imaging of other parts of musculoskeletal system: Secondary | ICD-10-CM | POA: Insufficient documentation

## 2010-09-08 DIAGNOSIS — M7989 Other specified soft tissue disorders: Secondary | ICD-10-CM | POA: Insufficient documentation

## 2010-09-11 ENCOUNTER — Other Ambulatory Visit: Payer: Self-pay | Admitting: Cardiology

## 2010-09-14 ENCOUNTER — Other Ambulatory Visit: Payer: Self-pay

## 2010-09-14 MED ORDER — PRAVASTATIN SODIUM 80 MG PO TABS: 80.0000 mg | ORAL_TABLET | Freq: Every day | ORAL | Status: DC

## 2010-09-14 MED ORDER — AMLODIPINE BESYLATE 10 MG PO TABS: 10.0000 mg | ORAL_TABLET | Freq: Every day | ORAL | Status: DC

## 2010-09-18 LAB — BLOOD GAS, ARTERIAL
Acid-base deficit: 0.1 mmol/L (ref 0.0–2.0)
Bicarbonate: 24 mEq/L (ref 20.0–24.0)
FIO2: 0.21 %
O2 Saturation: 96.6 %
Patient temperature: 37
TCO2: 21.2 mmol/L (ref 0–100)
pCO2 arterial: 38.9 mmHg (ref 35.0–45.0)
pH, Arterial: 7.407 (ref 7.350–7.450)
pO2, Arterial: 90 mmHg (ref 80.0–100.0)

## 2010-09-19 ENCOUNTER — Ambulatory Visit: Payer: Self-pay | Admitting: Cardiology

## 2010-09-21 ENCOUNTER — Other Ambulatory Visit: Payer: Self-pay | Admitting: Orthopaedic Surgery

## 2010-09-21 ENCOUNTER — Encounter (HOSPITAL_COMMUNITY): Payer: PRIVATE HEALTH INSURANCE

## 2010-09-21 LAB — CBC
HCT: 41.1 % (ref 39.0–52.0)
Hemoglobin: 13.7 g/dL (ref 13.0–17.0)
MCH: 29.1 pg (ref 26.0–34.0)
MCHC: 33.3 g/dL (ref 30.0–36.0)
MCV: 87.4 fL (ref 78.0–100.0)
Platelets: 295 10*3/uL (ref 150–400)
RBC: 4.7 MIL/uL (ref 4.22–5.81)
RDW: 12.6 % (ref 11.5–15.5)
WBC: 6.9 10*3/uL (ref 4.0–10.5)

## 2010-09-21 LAB — DIFFERENTIAL
Basophils Absolute: 0 10*3/uL (ref 0.0–0.1)
Basophils Relative: 0 % (ref 0–1)
Eosinophils Absolute: 0.2 10*3/uL (ref 0.0–0.7)
Eosinophils Relative: 3 % (ref 0–5)
Lymphocytes Relative: 28 % (ref 12–46)
Lymphs Abs: 1.9 10*3/uL (ref 0.7–4.0)
Monocytes Absolute: 0.6 10*3/uL (ref 0.1–1.0)
Monocytes Relative: 8 % (ref 3–12)
Neutro Abs: 4.2 10*3/uL (ref 1.7–7.7)
Neutrophils Relative %: 61 % (ref 43–77)

## 2010-09-21 LAB — COMPREHENSIVE METABOLIC PANEL
ALT: 22 U/L (ref 0–53)
AST: 20 U/L (ref 0–37)
Albumin: 4.2 g/dL (ref 3.5–5.2)
Alkaline Phosphatase: 55 U/L (ref 39–117)
BUN: 13 mg/dL (ref 6–23)
CO2: 28 mEq/L (ref 19–32)
Calcium: 9.2 mg/dL (ref 8.4–10.5)
Chloride: 102 mEq/L (ref 96–112)
Creatinine, Ser: 1.2 mg/dL (ref 0.4–1.5)
GFR calc Af Amer: >60 mL/min (ref >60)
GFR calc non Af Amer: >60 mL/min (ref >60)
Glucose, Bld: 107 mg/dL — ABNORMAL HIGH (ref 70–99)
Potassium: 4.5 mEq/L (ref 3.5–5.1)
Sodium: 138 mEq/L (ref 135–145)
Total Bilirubin: 0.5 mg/dL (ref 0.3–1.2)
Total Protein: 7 g/dL (ref 6.0–8.3)

## 2010-09-21 LAB — URINALYSIS, ROUTINE W REFLEX MICROSCOPIC
Bilirubin Urine: NEGATIVE
Glucose, UA: NEGATIVE mg/dL
Hgb urine dipstick: NEGATIVE
Ketones, ur: NEGATIVE mg/dL
Nitrite: NEGATIVE
Protein, ur: NEGATIVE mg/dL
Specific Gravity, Urine: 1.01 (ref 1.005–1.030)
Urobilinogen, UA: 0.2 mg/dL (ref 0.0–1.0)
pH: 6 (ref 5.0–8.0)

## 2010-09-22 ENCOUNTER — Other Ambulatory Visit: Payer: Self-pay

## 2010-09-22 DIAGNOSIS — I10 Essential (primary) hypertension: Secondary | ICD-10-CM | POA: Insufficient documentation

## 2010-09-22 MED ORDER — METOPROLOL TARTRATE 50 MG PO TABS: 50.0000 mg | ORAL_TABLET | Freq: Two times a day (BID) | ORAL | Status: DC

## 2010-09-25 ENCOUNTER — Ambulatory Visit (INDEPENDENT_AMBULATORY_CARE_PROVIDER_SITE_OTHER): Payer: PRIVATE HEALTH INSURANCE | Admitting: Cardiology

## 2010-09-25 ENCOUNTER — Encounter: Payer: Self-pay | Admitting: Cardiology

## 2010-09-25 VITALS — BP 102/62 | HR 56 | Ht 74.0 in | Wt 204.0 lb

## 2010-09-25 DIAGNOSIS — I2510 Atherosclerotic heart disease of native coronary artery without angina pectoris: Secondary | ICD-10-CM

## 2010-09-25 DIAGNOSIS — E785 Hyperlipidemia, unspecified: Secondary | ICD-10-CM

## 2010-09-25 DIAGNOSIS — I10 Essential (primary) hypertension: Secondary | ICD-10-CM

## 2010-09-25 MED ORDER — ASPIRIN 325 MG PO TABS: ORAL_TABLET | ORAL | Status: DC

## 2010-09-25 NOTE — Assessment & Plan Note (Addendum)
Patient has long-standing single vessel disease with total obstruction of the RCA.  He has done well with medical therapy.  With optimal control of cardiovascular risk factors, my hope is that he will not have further clinical issues with his coronary disease.  The proposed surgical procedure is associated with a low perioperative risk.  With single-vessel disease, normal LV systolic function and a total obstruction at catheterization within the past year, the likelihood of significant complications is low including cardiac complications.  [facility] will be available to assist if needed.

## 2010-09-25 NOTE — Assessment & Plan Note (Signed)
Blood pressure control is excellent.  It appears that his dose of beta blocker could not be increased due to the presence of substantial resting sinus bradycardia.  Current medications will be continued.

## 2010-09-25 NOTE — Progress Notes (Signed)
HPI : Mr. Maccoy returns to the office as scheduled for continued assessment and treatment of coronary artery disease and cardiovascular risk factors.  Since last visit, he is at extremely well from a cardiac standpoint.  He experiences occasional episodes of angina that responded to rest.  His principle problem has been degenerative joint disease of his right knee, which has improved with anti-inflammatory agents and narcotics; however, arthroscopic surgery is planned for later this week.  Current Outpatient Prescriptions on File Prior to Visit  Medication Sig Dispense Refill  . amLODipine (NORVASC) 10 MG tablet take 1 tablet by mouth once daily  30 tablet  0  . calcium carbonate (TUMS - DOSED IN MG ELEMENTAL CALCIUM) 500 MG chewable tablet Chew 1 tablet by mouth daily.        Marland Kitchen esomeprazole (NEXIUM) 40 MG capsule Take 40 mg by mouth daily before breakfast.        . isosorbide mononitrate (IMDUR) 60 MG 24 hr tablet take 1 tablet by mouth once daily  30 tablet  3  . metoprolol (LOPRESSOR) 50 MG tablet Take 1 tablet (50 mg total) by mouth 2 (two) times daily.  60 tablet  0  . nitroGLYCERIN (NITROSTAT) 0.4 MG SL tablet Place 0.4 mg under the tongue every 5 (five) minutes as needed.        . pravastatin (PRAVACHOL) 80 MG tablet take 1 tablet by mouth at bedtime  30 tablet  0  . DISCONTD: aspirin 325 MG tablet Take by mouth daily.       Marland Kitchen DISCONTD: amLODipine (NORVASC) 10 MG tablet Take 1 tablet (10 mg total) by mouth daily. Pt. Is due for OV   30 tablet  0  . DISCONTD: cyclobenzaprine (FLEXERIL) 10 MG tablet Take 10 mg by mouth as needed.        Marland Kitchen DISCONTD: pravastatin (PRAVACHOL) 80 MG tablet Take 1 tablet (80 mg total) by mouth at bedtime.  30 tablet  0  . DISCONTD: predniSONE (DELTASONE) 10 MG tablet Take 10 mg by mouth daily.           No Known Allergies    Past medical history, social history, and family history reviewed and updated.  ROS: No orthopnea, PND, dyspnea, lightheadedness or  syncope.  No sputum production nor cough.  Continues to refrain from cigarette smoking.  PHYSICAL EXAM: BP 102/62  Pulse 56  Ht 6\' 2"  (1.88 m)  Wt 204 lb (92.534 kg)  BMI 26.19 kg/m2  SpO2 97%  General-Well developed; no acute distress Body habitus-proportionate weight and height Neck-No JVD; no carotid bruits Lungs-clear lung fields; resonant to percussion Cardiovascular-normal PMI; normal S1 and S2; fourth heart sound present Abdomen-normal bowel sounds; soft and non-tender without masses or organomegaly Musculoskeletal-No deformities, no cyanosis or clubbing Neurologic-Normal cranial nerves; symmetric strength and tone Skin-Warm, no significant lesions Extremities-distal pulses intact; no edema  EKG:  Sinus bradycardia at a rate of 54 bpm; J-point elevation in the inferior leads; nondiagnostic inferior Q waves; no change when compared to previous tracing of 06/11/2008  ASSESSMENT AND PLAN:

## 2010-09-25 NOTE — Patient Instructions (Signed)
Your physician has recommended you make the following change in your medication: Start taking Fish Oil 1 tablet daily, if you tolerate please increase to 2 tablets daily or 1 tablet twice a day  Your physician recommends that you schedule a follow-up appointment in: 1 year

## 2010-09-25 NOTE — Assessment & Plan Note (Signed)
Recent lipid profile was excellent; current therapy will be continued. 

## 2010-09-26 ENCOUNTER — Other Ambulatory Visit: Payer: Self-pay | Admitting: Orthopaedic Surgery

## 2010-09-26 ENCOUNTER — Ambulatory Visit (HOSPITAL_COMMUNITY)
Admission: RE | Admit: 2010-09-26 | Discharge: 2010-09-26 | Disposition: A | Discharge status: Home / Self Care | Payer: PRIVATE HEALTH INSURANCE | Source: Ambulatory Visit | Attending: Orthopaedic Surgery | Admitting: Orthopaedic Surgery

## 2010-09-26 DIAGNOSIS — Z01812 Encounter for preprocedural laboratory examination: Secondary | ICD-10-CM | POA: Insufficient documentation

## 2010-09-26 DIAGNOSIS — Z79899 Other long term (current) drug therapy: Secondary | ICD-10-CM | POA: Insufficient documentation

## 2010-09-26 DIAGNOSIS — I10 Essential (primary) hypertension: Secondary | ICD-10-CM | POA: Insufficient documentation

## 2010-09-26 DIAGNOSIS — M23305 Other meniscus derangements, unspecified medial meniscus, unspecified knee: Secondary | ICD-10-CM | POA: Insufficient documentation

## 2010-09-26 LAB — SURGICAL PCR SCREEN
MRSA, PCR: NEGATIVE
Staphylococcus aureus: NEGATIVE

## 2010-09-26 NOTE — H&P (Signed)
Timothy Shaw, Timothy Shaw               ACCOUNT NO.:  [id number]  MEDICAL RECORD NO.:  [id number]          PATIENT TYPE:  OPT  LOCATION:                                FACILITY:  [facility]  PHYSICIAN:  J. Darreld Mclean, M.D. DATE OF BIRTH:  1956-03-09  DATE OF ADMISSION: DATE OF DISCHARGE:  LH                             HISTORY & PHYSICAL   CHIEF COMPLAINT:  Right knee pain.  HISTORY OF PRESENT ILLNESS:  The patient is a 55 year old male with pain and tenderness in his right knee, some giving way pain medially.  I did a knee arthroscopy in 2006 and he has done well since then until about the last 4-5 months.  He started having some pain and then he started to have more swelling and then giving way up.  Obtained an MRI which shows a new tear in the remnant of the remaining medial meniscus.  He has some mild degenerative joint disease present as well.  I have recommended repeat arthroscopy.  He thought about this and agrees to procedure.  I have gone over the risks and imponderables.  He signed a separate operative permit for me.  He is currently taking medications, taking Nexium 40 mg a day, metoprolol tartrate 50 mg a day, aspirin 325 mg a day, etodolac 400 mg b.i.d., isosorbide mononitrate ER 60 mg every 24 hours, amlodipine 10 mg a day, and pravastatin 80 mg a day.  PREVIOUS SURGERIES:  Tonsillectomy and hernia in 1962, appendectomy in 1970, another hernia in 2003, and right knee surgery in 2006 by me.  [name] is his family doctor and he sees [name], his cardiologist.  He smoked for 17 years and quit in 1990.  He has not smoked any since then.  The patient is married and lives here in [place].  The patient has a history of heart disease with occasional arrhythmia of the heart and hypertension.  He is followed by [name] for this as well as [name].  REVIEW OF SYSTEMS:  Otherwise negative.  Diabetes, heart disease, and cancer run in the family.  He has no  allergies.  PHYSICAL EXAMINATION:  VITAL SIGNS:  Within normal limits. HEENT:  Negative. GENERAL:  Alert, cooperative, and oriented. NECK:  Supple. Lungs: Clear to P and A. HEART:  Regular without murmur heard. ABDOMEN:  Soft and nontender without masses. EXTREMITIES:  Right knee, he has got mild effusion, positive pain and tenderness about the medial joint line with positive McMurray's.  Range of motion 0-105 with pain.  He has a slight limp to the right when walking.  Other extremities are negative. CENTRAL NERVOUS SYSTEM:  Intact. SKIN:  Intact.  IMPRESSION: 1. Recurrent tear of posterior horn medial meniscus. 2. Degenerative joint disease of the right knee. 3. History of heart disease. 4. Hypertension. 5. Heart arrhythmias.  PLAN:  Operative arthroscopy of the right knee.  I have discussed with the patient the planned procedure, risks and imponderables.  Labs are pending.  ______________________________ Shela Commons. Darreld Mclean, M.D.     JWK/MEDQ  D:  09/19/2010  T:  09/19/2010  Job:  [id number]  Electronically Signed by Darreld Mclean M.D. on 09/26/2010 07:19:59 AM

## 2010-09-29 ENCOUNTER — Ambulatory Visit (HOSPITAL_COMMUNITY)
Admission: RE | Admit: 2010-09-29 | Discharge: 2010-09-29 | Disposition: A | Discharge status: Home / Self Care | Payer: PRIVATE HEALTH INSURANCE | Source: Ambulatory Visit | Attending: Orthopaedic Surgery | Admitting: Orthopaedic Surgery

## 2010-09-29 DIAGNOSIS — M25569 Pain in unspecified knee: Secondary | ICD-10-CM | POA: Insufficient documentation

## 2010-09-29 DIAGNOSIS — M6281 Muscle weakness (generalized): Secondary | ICD-10-CM | POA: Insufficient documentation

## 2010-09-29 DIAGNOSIS — IMO0001 Reserved for inherently not codable concepts without codable children: Secondary | ICD-10-CM | POA: Insufficient documentation

## 2010-09-29 DIAGNOSIS — M25669 Stiffness of unspecified knee, not elsewhere classified: Secondary | ICD-10-CM | POA: Insufficient documentation

## 2010-09-29 DIAGNOSIS — R262 Difficulty in walking, not elsewhere classified: Secondary | ICD-10-CM | POA: Insufficient documentation

## 2010-10-03 ENCOUNTER — Ambulatory Visit (HOSPITAL_COMMUNITY)
Admission: RE | Admit: 2010-10-03 | Discharge: 2010-10-03 | Disposition: A | Discharge status: Home / Self Care | Payer: PRIVATE HEALTH INSURANCE | Source: Ambulatory Visit | Attending: Pulmonary Disease | Admitting: Pulmonary Disease

## 2010-10-03 DIAGNOSIS — R262 Difficulty in walking, not elsewhere classified: Secondary | ICD-10-CM | POA: Insufficient documentation

## 2010-10-03 DIAGNOSIS — M6281 Muscle weakness (generalized): Secondary | ICD-10-CM | POA: Insufficient documentation

## 2010-10-03 DIAGNOSIS — M25569 Pain in unspecified knee: Secondary | ICD-10-CM | POA: Insufficient documentation

## 2010-10-03 DIAGNOSIS — IMO0001 Reserved for inherently not codable concepts without codable children: Secondary | ICD-10-CM | POA: Insufficient documentation

## 2010-10-03 DIAGNOSIS — M25669 Stiffness of unspecified knee, not elsewhere classified: Secondary | ICD-10-CM | POA: Insufficient documentation

## 2010-10-06 ENCOUNTER — Ambulatory Visit (HOSPITAL_COMMUNITY)
Admission: RE | Admit: 2010-10-06 | Discharge: 2010-10-06 | Disposition: A | Discharge status: Home / Self Care | Payer: PRIVATE HEALTH INSURANCE | Source: Ambulatory Visit | Attending: Orthopaedic Surgery | Admitting: Orthopaedic Surgery

## 2010-10-09 ENCOUNTER — Ambulatory Visit (HOSPITAL_COMMUNITY)
Admission: RE | Admit: 2010-10-09 | Discharge: 2010-10-09 | Disposition: A | Discharge status: Home / Self Care | Payer: PRIVATE HEALTH INSURANCE | Source: Ambulatory Visit | Attending: Pulmonary Disease | Admitting: Pulmonary Disease

## 2010-10-09 DIAGNOSIS — IMO0001 Reserved for inherently not codable concepts without codable children: Secondary | ICD-10-CM | POA: Insufficient documentation

## 2010-10-09 DIAGNOSIS — M25669 Stiffness of unspecified knee, not elsewhere classified: Secondary | ICD-10-CM | POA: Insufficient documentation

## 2010-10-09 DIAGNOSIS — M25569 Pain in unspecified knee: Secondary | ICD-10-CM | POA: Insufficient documentation

## 2010-10-09 DIAGNOSIS — M6281 Muscle weakness (generalized): Secondary | ICD-10-CM | POA: Insufficient documentation

## 2010-10-09 DIAGNOSIS — R262 Difficulty in walking, not elsewhere classified: Secondary | ICD-10-CM | POA: Insufficient documentation

## 2010-10-09 DIAGNOSIS — I10 Essential (primary) hypertension: Secondary | ICD-10-CM | POA: Insufficient documentation

## 2010-10-09 NOTE — Op Note (Signed)
NAMEJAKHAI, Timothy Shaw               ACCOUNT NO.:  [id number]  MEDICAL RECORD NO.:  [id number]           PATIENT TYPE:  O  LOCATION:  [facility]                          FACILITY:  [facility]  PHYSICIAN:  J. Darreld Mclean, M.D. DATE OF BIRTH:  1955/12/10  DATE OF PROCEDURE: DATE OF DISCHARGE:                              OPERATIVE REPORT   PREOPERATIVE DIAGNOSIS:  Recurrent tear, right medial meniscus.  POSTOPERATIVE DIAGNOSIS:  Recurrent tear, right medial meniscus.  PROCEDURE:  Operative arthroscopy of the right knee with partial right lateral meniscectomy.  ANESTHESIA:  General.  SURGEON:  J. Darreld Mclean, MD  TOURNIQUET TIME:  30 minutes.  DRAINS:  None.  INDICATIONS:  The patient has recurring pain in the right knee over the last several months.  MRI shows a new recurrent tear of the remaining remnant of the medial meniscus.  He had arthroscopy of the knee in 2006. He did well until recently.  Because he has not improved from conservative treatment, because of the findings and increasing pain, recommended repeat procedure.  The patient does have degenerative joint disease, he is aware of that and aware that the procedure will not repair.  OPERATIVE REPORT:  The patient was seen in the holding area and the right knee was identified as correct surgical site.  He put a mark on the right knee.  I put a mark on the right knee.  He was brought to the operating room, given general anesthesia while supine.  Tourniquet was placed, deflated the right upper thigh and leg holder was placed on right upper thigh.  He was prepped and draped in usual manner.  We had a time-out, we identified the patient as Timothy Shaw and we were doing his right knee.  The operating room team knew each other, all instrumentation was deemed to be properly positioned and working.  We elevated the right leg and wrapped it circumferentially using an Esmarch bandage.  Tourniquet inflated to 300 mmHg.  Esmarch  bandage was removed.  Inflow cannula inserted medially, lactated Ringer's instilled in the knee via an infusion pump.  Arthroscope inserted laterally. Suprapatellar pouch had some mild synovitis.  Medius tear in the posterior horn of the meniscus.  He has significant degenerative disease with grade 3-4 changes particularly more medially and posteriorly with grade 4 changes.  He is to document and picture was taken throughout the procedure.  Anterior cruciate was intact laterally.  It was difficult to see, but the meniscus was intact with slight fraying, permanent picture was taken.  Attention directed back to the medial side and using meniscal punch and meniscal shaver, good smooth contour was obtained from the remaining part of the meniscus.  Most of the meniscus was removed.  There was no loose bodies floating within the knee.  Permanent pictures were taken and the knee was systematically examined and no new pathology found.  Wounds were reapproximated with 3-0 nylon, interrupted vertical mattress manner, Marcaine 0.25% instilled in each portal.  The patient tolerated the procedure well and tourniquet deflated up to 30 minutes.  He will go to recovery in good condition.  He already  has Norco 7.5/325 to take for pain.  He is to use crutches, ice, elevation. Physical therapy has been set up also.  He is to call me through the office or hospital beeper system after hours if any difficulty.          ______________________________ Shela Commons. Darreld Mclean, M.D.     JWK/MEDQ  D:  09/26/2010  T:  09/26/2010  Job:  [id number]  Electronically Signed by Darreld Mclean M.D. on 10/09/2010 11:15:24 AM

## 2010-10-11 ENCOUNTER — Ambulatory Visit (HOSPITAL_COMMUNITY)
Admission: RE | Admit: 2010-10-11 | Discharge: 2010-10-11 | Disposition: A | Discharge status: Home / Self Care | Payer: PRIVATE HEALTH INSURANCE | Source: Ambulatory Visit | Attending: Pulmonary Disease | Admitting: Pulmonary Disease

## 2010-10-13 ENCOUNTER — Other Ambulatory Visit: Payer: Self-pay | Admitting: Cardiology

## 2010-10-16 ENCOUNTER — Other Ambulatory Visit (HOSPITAL_COMMUNITY)
Admission: RE | Admit: 2010-10-16 | Discharge: 2010-10-16 | Disposition: A | Discharge status: Home / Self Care | Payer: PRIVATE HEALTH INSURANCE | Source: Ambulatory Visit | Attending: Orthopaedic Surgery | Admitting: Orthopaedic Surgery

## 2010-10-16 ENCOUNTER — Other Ambulatory Visit: Payer: Self-pay | Admitting: Orthopaedic Surgery

## 2010-10-16 DIAGNOSIS — M6530 Trigger finger, unspecified finger: Secondary | ICD-10-CM | POA: Insufficient documentation

## 2010-10-17 NOTE — Letter (Signed)
June 08, 2008    Edward L. Juanetta Gosling, MD  [address]  [place], [place] [address]   RE:  Timothy Shaw, Timothy Shaw  MRN:  [id number]  /  DOB:  1955-10-02   Dear Renae Fickle,   It was my pleasure evaluating Timothy Shaw in consultation today in the  office for chest discomfort.  This nice gentleman has generally enjoyed  excellent health.  He has recently described episodes of dyspnea.  These  typically occur after eating or at night when he is in bed.  They pass  spontaneously after number of minutes.  He has also had two spells that  he characterizes as an anxiety attack..  He developed nervousness,  lightheadedness, chest discomfort, and difficulty breathing.  He is  experiencing some anxiety related to sacrifice, this was requested from  the staff at his workplace.  He describes no problems at home.   The chest discomfort is vaguely described.  Characteristically, it seems  to be fairly sharp.  It is mild-to-moderate in intensity.  There is no  radiation.  He is unaware of anything, he continued to improve or to  exacerbate the symptom.  He has never previously undergone any cardiac  testing nor has he been evaluated by a cardiologist.  He recently was  found to be hypertensive and has had some anxiety related to that  diagnosis as well.  He has had mild hyperlipidemia treated with low dose  statin therapy.   PAST MEDICAL HISTORY:  Otherwise notable for GERD.  He has undergone a  number of surgical procedures including repair of an umbilical hernia,  arthroscopic right knee surgery for cartilage damage, appendectomy, and  tonsillectomy.   He has no known allergies.   CURRENT MEDICATIONS:  Diovan 160 mg daily, omeprazole 20 mg daily,  aspirin 325 mg daily, rosuvastatin 5 mg daily.   SOCIAL HISTORY:  No use of alcohol nor tobacco; employed at [employer].  Married, with one 46 year old child.   FAMILY HISTORY:  Father is alive, but has a history of CHF and has  required pacemaker implantation.   Mother is alive and well.  Of three  siblings, one has diabetes and carcinoma of the colon and one has lupus.   REVIEW OF SYSTEMS:  Notable for intermittent sinus headaches, the need  for corrective lenses, occasional brief palpitations, heartburn, and  arthritic discomfort of the hands, and lower back.  All other systems  reviewed and are negative.   PHYSICAL EXAMINATION:  GENERAL:  Pleasant proportionate gentleman in no  acute distress.  VITAL SIGNS:  The weight is 217 pounds, blood pressure 135/80, heart  rate is 66 and regular.  HEENT:  Anicteric sclerae; normal lids, and conjunctivae; normal oral  mucosa.  NECK:  No jugular venous distention; normal carotid upstrokes without  bruits.  ENDOCRINE:  No thyromegaly.  HEMATOPOIETIC:  No adenopathy.  LUNGS:  Clear.  CARDIAC:  Normal first and second heart sound; normal PMI.  ABDOMEN:  Soft and nontender; no organomegaly.  EXTREMITIES:  No edema; normal distal pulses.  NEUROLOGIC:  Symmetric strength and tone; normal cranial nerves.   EKG:  Normal sinus rhythm; nondiagnostic inferior Q-waves; delayed R  wave progression; prominent voltage; otherwise normal.  No significant  change when compared to a previous tracing of May 24, 2008.   IMPRESSION:  Timothy Shaw has generally atypical chest discomfort, although  he does describe some chest tightness when climbing stairs.  His  hypertension and dyslipidemia constitute significant cardiovascular risk  factors, but the former is only recently noted and the latter is quite  mild.  We will proceed with basic laboratory studies for his new onset  hypertension including a chemistry profile, CBC, TSH level, and  urinalysis.  Due to dyspnea, a BNP level will be obtained as well and a  chest x-ray.  He will undergo a stress echocardiogram to evaluate for  structural  heart disease and to rule out exercise-induced ischemia.  I will let you  know the results of these studies as soon as they  have been completed.  I suspect that all will being normal, and no further testing or  treatment will be warranted.  Thanks so much for sending this nice  gentleman to see me.    Sincerely,      Gerrit Friends. Dietrich Pates, MD, [facility]  Electronically Signed    RMR/MedQ  DD: 06/08/2008  DT: 06/09/2008  Job #: [id number]

## 2010-10-17 NOTE — Letter (Signed)
June 21, 2008    Edward L. Juanetta Gosling, MD  [address]  [place], [place] [address]   RE:  Timothy Shaw, Timothy Shaw  MRN:  [id number]  /  DOB:  Oct 26, 1955   Dear Renae Fickle,   [name] returns to the office in followup for episodes of chest  discomfort and dyspnea.  Since his last visit, he has done somewhat  better.  In recalling his most severe episodes, they did occur in mostly  stressful situations, once when he was waiting for the dentist and once  when he was waiting for a job interview.  He has begun to exercise on a  regular basis by walking, although he has some problems with his knees.   Medications are unchanged from his last visit.   His exam is also unchanged.  Blood pressure is good today at 120/75 with  a heart rate of 65.  His weight is 218, also unchanged.  He has modest  systolic murmur.  Lungs are clear.  There is no peripheral edema.   Reviewing his laboratory studies, his echocardiogram was normal except  for the presence of mild to moderate LVH.  Likewise, his stress echo was  negative at a good workload.  There were borderline electrocardiographic  abnormalities, but the myocardial response to exercise was perfectly  normal.  Similarly, his lab tests were all entirety normal.   IMPRESSION:  [name] appears to be a healthy gentleman.  We had  further discussion regarding the possible mediating effect of anxiety on  his symptoms.  He is not entirely  convinced, but does note significant stress in his life.  Hopefully,  regular physical exercise and improvement in the economy will result in  improvement in his stress and anxiety.  If not, you may consider  referring him for counseling.  Please let me know at any time that I can  offer further assistance with his medical care.    Sincerely,      Gerrit Friends. Dietrich Pates, MD, [facility]  Electronically Signed    RMR/MedQ  DD: 06/21/2008  DT: 06/22/2008  Job #: [id number]

## 2010-10-18 ENCOUNTER — Ambulatory Visit (HOSPITAL_COMMUNITY)
Admission: RE | Admit: 2010-10-18 | Discharge: 2010-10-18 | Disposition: A | Discharge status: Home / Self Care | Payer: PRIVATE HEALTH INSURANCE | Source: Ambulatory Visit | Attending: Pulmonary Disease | Admitting: Pulmonary Disease

## 2010-10-19 ENCOUNTER — Ambulatory Visit (HOSPITAL_COMMUNITY)
Admission: RE | Admit: 2010-10-19 | Discharge: 2010-10-19 | Disposition: A | Discharge status: Home / Self Care | Payer: PRIVATE HEALTH INSURANCE | Source: Ambulatory Visit | Attending: Pulmonary Disease | Admitting: Pulmonary Disease

## 2010-10-20 NOTE — Procedures (Signed)
Timothy Shaw, Timothy Shaw               ACCOUNT NO.:  [id number]   MEDICAL RECORD NO.:  [id number]          PATIENT TYPE:  OUT   LOCATION:  RESP                          FACILITY:  [facility]   PHYSICIAN:  Edward L. Juanetta Gosling, M.D.DATE OF BIRTH:  04-Sep-1955   DATE OF PROCEDURE:  DATE OF DISCHARGE:                            PULMONARY FUNCTION TEST   1. Spirometry shows no ventilatory defect, but does show some evidence      of airflow obstruction, which may be to some extent artifactual      with no ventilatory defect being seen.  2. Lung volumes are normal.  3. DLCO is normal.  4. Arterial blood gas is normal.  5. There is no significant bronchodilator improvement.      Edward L. Juanetta Gosling, M.D.  Electronically Signed     ELH/MEDQ  D:  06/23/2008  T:  06/23/2008  Job:  [id number]

## 2010-10-20 NOTE — H&P (Signed)
NAMEMARCUS, [name]               ACCOUNT NO.:  [id number]   MEDICAL RECORD NO.:  [id number]          PATIENT TYPE:  AMB   LOCATION:  DAY                           FACILITY:  [facility]   PHYSICIAN:  J. Darreld Mclean, M.D. DATE OF BIRTH:  [birth year]   DATE OF ADMISSION:  DATE OF DISCHARGE:  LH                                HISTORY & PHYSICAL   CHIEF COMPLAINT:  Right knee pain.   The patient is a 55 year old male with pain and tenderness in his right  knee. The patient has gotten progressively worse since he injured it on June  11 on his daughter's birthday. He was seen by [name], and [name]  obtained a MRI of his right knee which showed a tear of the medial meniscus  of the knee. He also has tricompartmental arthritis, small Baker's cyst, and  intact ligamentous structures. I have discussed arthroscopy of the knee with  the patient, risk and imponderables. He appears to understands and agrees  and accepts to the procedure. He understands this is an outpatient  procedure.   CURRENT MEDICATIONS:  1.  Nexium daily.  2.  Joint Juice daily.  3.  Tylenol #3.   PAST HISTORY:  Denies any significant diseases except history of ulcer  disease in 1995. He does not smoke. Does not use alcoholic beverages. Status  post tonsillectomy as a child. Appendectomy as a child. Sinus surgery in  1987. Umbilical hernia repair in 2000. He has a sister with lupus and  diabetes. Denies any other disease that run in the family. The patient is  married, lives in [place] and works for [employer].   PHYSICAL EXAMINATION:  VITAL SIGNS:  BP is 138/76, pulse 66, respirations 18  and afebrile. Six foot 3, 198 pounds.  GENERAL:  He is alert, cooperative, oriented.  HEENT:  Negative.  NECK:  Supple.  LUNGS:  Clear to P&A.  HEART:  Regular without murmur heard.  ABDOMEN:  Soft, nontender, without masses.  EXTREMITIES:  Right knee has mild effusion, tenderness medially. McMurray's  weakly positive  medially.  CENTRAL NERVOUS SYSTEM:  Intact.  SKIN:  Intact.   IMPRESSION:  Tear of the medial meniscus of the right knee.   PLAN:  Arthroscopy of the right knee. Risks and imponderables have been  discussed. This is an outpatient procedure. Labs are pending.                                            ______________________________  J. Darreld Mclean, M.D.     JWK/MEDQ  D:  02/14/2005  T:  02/14/2005  Job:  [id number]

## 2010-10-20 NOTE — Op Note (Signed)
Timothy Shaw, Timothy Shaw               ACCOUNT NO.:  [id number]   MEDICAL RECORD NO.:  [id number]          PATIENT TYPE:  AMB   LOCATION:  DAY                           FACILITY:  [facility]   PHYSICIAN:  J. Darreld Mclean, M.D. DATE OF BIRTH:  10-03-1955   DATE OF PROCEDURE:  02/15/2005  DATE OF DISCHARGE:                                 OPERATIVE REPORT   PREOPERATIVE DIAGNOSIS:  Tear of medial meniscus, right knee.   POSTOPERATIVE DIAGNOSIS:  Tear of medial meniscus, right knee.   PROCEDURE:  Operative arthroscopy of the right knee, partial medial  meniscectomy using a laser.   ANESTHESIA:  General.   TOURNIQUET TIME:  30 minutes. No drains.   SURGEON:  Dr. Hilda Lias.   INDICATIONS:  The patient is a 55 year old male with pain and tenderness in  his right knee. He fell on June 11 and hurt his knee. He had pain and  tenderness. He say [name]. MRI was done which showed tear of the medial  meniscus of the right knee. He has not improved with conservative treatment.  He has elected at this time to have the procedure done. Risks and  imponderables have been discussed preoperatively. The patient appears to  understand the procedure and agrees.   OPERATIVE FINDINGS:  Suprapatellar pouch looked normal. On the undersurface  of the patella, there is some early grade 2 changes. Medially, there was  some grade 2 to 3 changes on the femoral condyle, tear of the posterior horn  of the medial meniscus, no loose fragments. Anterior cruciate was intact.  Laterally, the meniscus was intact. No fragments. Cartilage looked normal  laterally.   DESCRIPTION OF PROCEDURE:  The patient was seen in the holding area. The  patient's right knee was identified as the correct surgical site. He placed  a mark on the knee; I placed a mark on the knee. He was brought back to the  operating room. He was given general anesthesia, placed supine on the  operating room table. Leg holder and tourniquet placed  deflated right upper  thigh. The patient was then prepped and draped in the usual manner. We had a  time out and again reidentified the patient and the right knee as the  correct surgical site. His leg was elevated and wrapped circumferentially  with an Esmarch, tourniquet inflated to 300 mmHg, Esmarch bandage removed.  An inflow cannula was inserted medially and lactated ringers instilled in  the knee by an infusion pump. Arthroscope inserted laterally and knee  systematically examined. Please see findings above. Attention directed to  the medial side of the knee. Then using a meniscal blade, a punch, a  meniscal shaver, and a holmium laser, the meniscus was removed. I used a  laser to most of the debridement and removal. Most of it was vaporized. Some  loose fragments were removed using suction. Good smooth contour was  obtained. Current pictures were taken. Knee was systematically reexamined  and no new pathology seen. Wounds were reapproximated using 3-0 nylon  interrupted vertical mattress manner. Marcaine 0.25% instilled in each  portal. Sterile dressing  applied and bulky dressing  applied. The patient tolerated the procedure well and was placed in a knee  immobilizer. Prescription for Vicodin ES given for pain. I will see him in  the office in approximately 10 days to 2 weeks. Physical therapy has been  arranged. If any difficulties, contact me through the office hospital beeper  system. Numbers have been provided.           ______________________________  Shela Commons. Darreld Mclean, M.D.     JWK/MEDQ  D:  02/15/2005  T:  02/15/2005  Job:  [id number]

## 2010-10-20 NOTE — Procedures (Signed)
   NAME:  Timothy Shaw, Timothy Shaw                         ACCOUNT NO.:  [id number]   MEDICAL RECORD NO.:  [id number]                   PATIENT TYPE:  OUT   LOCATION:  DFTL                                 FACILITY:  [facility]   PHYSICIAN:  Fredirick Maudlin, M.D.              DATE OF BIRTH:  27-Dec-1955   DATE OF PROCEDURE:  DATE OF DISCHARGE:                                    STRESS TEST   INDICATIONS FOR PROCEDURE:  The patient has been having chest pain that is  associated with exercise.  It is atypical for cardiac disease but there is a  family history.  There are no contraindications to cardiac exercise testing.   The patient exercised for 10 minutes reaching and sustaining 12.9 mets.  His  maximum recorded heart rate was 154, which is 88% of his age-predicted  maximal heart rate.  He did have chest discomfort that was in the lung  area.  There were no electrocardiographic changes suggestive of inducible  ischemia and his blood pressure response to exercise was normal.   IMPRESSION:  1. Good exercise tolerance.  2. No evidence of reducible ischemia.  3. Normal blood pressure response to exercise.  4. Chest pain developed but was not associated with EKG changes.  He is     going to try a metered dose inhaler with albuterol to see if is resolves     his symptoms.                                               Fredirick Maudlin, M.D.    ELH/MEDQ  D:  01/27/2002  T:  01/29/2002  Job:  [id number]

## 2010-10-24 ENCOUNTER — Ambulatory Visit (HOSPITAL_COMMUNITY)
Admission: RE | Admit: 2010-10-24 | Discharge: 2010-10-24 | Disposition: A | Discharge status: Home / Self Care | Payer: PRIVATE HEALTH INSURANCE | Source: Ambulatory Visit | Attending: Pulmonary Disease | Admitting: Pulmonary Disease

## 2010-10-26 ENCOUNTER — Ambulatory Visit (HOSPITAL_COMMUNITY)
Admission: RE | Admit: 2010-10-26 | Discharge: 2010-10-26 | Disposition: A | Discharge status: Home / Self Care | Payer: PRIVATE HEALTH INSURANCE | Source: Ambulatory Visit | Attending: Pulmonary Disease | Admitting: Pulmonary Disease

## 2010-10-31 ENCOUNTER — Ambulatory Visit (HOSPITAL_COMMUNITY)
Admission: RE | Admit: 2010-10-31 | Discharge: 2010-10-31 | Disposition: A | Discharge status: Home / Self Care | Payer: PRIVATE HEALTH INSURANCE | Source: Ambulatory Visit | Attending: Pulmonary Disease | Admitting: Pulmonary Disease

## 2010-11-01 ENCOUNTER — Other Ambulatory Visit: Payer: Self-pay | Admitting: Cardiology

## 2010-11-02 ENCOUNTER — Ambulatory Visit (HOSPITAL_COMMUNITY)
Admission: RE | Admit: 2010-11-02 | Discharge: 2010-11-02 | Disposition: A | Discharge status: Home / Self Care | Payer: PRIVATE HEALTH INSURANCE | Source: Ambulatory Visit | Attending: Pulmonary Disease | Admitting: Pulmonary Disease

## 2010-11-06 ENCOUNTER — Ambulatory Visit (HOSPITAL_COMMUNITY): Payer: PRIVATE HEALTH INSURANCE | Admitting: *Deleted

## 2010-11-09 ENCOUNTER — Ambulatory Visit (HOSPITAL_COMMUNITY): Payer: PRIVATE HEALTH INSURANCE | Admitting: *Deleted

## 2010-11-13 ENCOUNTER — Other Ambulatory Visit: Payer: Self-pay | Admitting: Cardiology

## 2010-11-14 MED ORDER — PRAVASTATIN SODIUM 80 MG PO TABS: 80.0000 mg | ORAL_TABLET | Freq: Every day | ORAL | Status: DC

## 2010-11-14 MED ORDER — AMLODIPINE BESYLATE 10 MG PO TABS: 10.0000 mg | ORAL_TABLET | Freq: Every day | ORAL | Status: DC

## 2011-01-05 ENCOUNTER — Other Ambulatory Visit: Payer: Self-pay | Admitting: Cardiology

## 2011-03-02 ENCOUNTER — Other Ambulatory Visit: Payer: Self-pay | Admitting: Cardiology

## 2011-05-09 ENCOUNTER — Other Ambulatory Visit: Payer: Self-pay | Admitting: Cardiology

## 2011-06-07 ENCOUNTER — Other Ambulatory Visit: Payer: Self-pay | Admitting: Cardiology

## 2011-08-10 ENCOUNTER — Encounter: Payer: Self-pay | Admitting: Gastroenterology

## 2011-09-06 ENCOUNTER — Ambulatory Visit (INDEPENDENT_AMBULATORY_CARE_PROVIDER_SITE_OTHER): Payer: PRIVATE HEALTH INSURANCE | Admitting: Cardiology

## 2011-09-06 ENCOUNTER — Encounter: Payer: Self-pay | Admitting: Cardiology

## 2011-09-06 VITALS — BP 102/63 | HR 57 | Ht 74.0 in | Wt 204.0 lb

## 2011-09-06 DIAGNOSIS — E785 Hyperlipidemia, unspecified: Secondary | ICD-10-CM | POA: Insufficient documentation

## 2011-09-06 DIAGNOSIS — E782 Mixed hyperlipidemia: Secondary | ICD-10-CM

## 2011-09-06 DIAGNOSIS — I2510 Atherosclerotic heart disease of native coronary artery without angina pectoris: Secondary | ICD-10-CM

## 2011-09-06 DIAGNOSIS — I10 Essential (primary) hypertension: Secondary | ICD-10-CM

## 2011-09-06 MED ORDER — AMLODIPINE BESYLATE 5 MG PO TABS: 5.0000 mg | ORAL_TABLET | Freq: Every day | ORAL | Status: DC

## 2011-09-06 NOTE — Progress Notes (Signed)
Patient ID: Timothy Shaw, male   DOB: 02-04-1956, 56 y.o.   MRN: [id number]  HPI: Scheduled return visit for this very nice Timothy Shaw gentleman with coronary artery disease.  He has had no further problems with chest discomfort or dyspnea.  He is relatively inactive, but walks perhaps 1 mile per day with his dog.  He has experienced mild to moderate orthostatic lightheadedness without syncope.  He does not monitor blood pressure at home.  He has developed no new medical problems nor required care in the emergency department or hospital.  Prior to Admission medications   Medication Sig Start Date End Date Taking? Authorizing Provider  amLODipine (NORVASC) 10 MG tablet take 1 tablet by mouth once daily 06/07/11  Yes Kathlen Brunswick, MD  aspirin 325 MG EC tablet Take 325 mg by mouth daily.     Yes Historical Provider, MD  calcium carbonate (TUMS - DOSED IN MG ELEMENTAL CALCIUM) 500 MG chewable tablet Chew 1 tablet by mouth daily.     Yes Historical Provider, MD  esomeprazole (NEXIUM) 40 MG capsule Take 40 mg by mouth daily before breakfast.     Yes Historical Provider, MD  fish oil-omega-3 fatty acids 1000 MG capsule Take 2 g by mouth daily.     Yes Historical Provider, MD  HYDROcodone-acetaminophen (NORCO) 7.5-325 MG per tablet Take 1 tablet by mouth every 6 (six) hours as needed.  08/27/10  Yes Historical Provider, MD  isosorbide mononitrate (IMDUR) 60 MG 24 hr tablet take 1 tablet by mouth once daily 05/09/11  Yes Kathlen Brunswick, MD  metoprolol (LOPRESSOR) 50 MG tablet take 1 tablet by mouth twice a day 11/01/10  Yes Kathlen Brunswick, MD  NITROSTAT 0.4 MG SL tablet place 1 tablet under the tongue AT ONSET OF CHEST PAIN; YOU MAY REPEAT EVERY 5 MINUTES FOR UP TO 3 DOSES 03/02/11  Yes Kathlen Brunswick, MD  pravastatin (PRAVACHOL) 80 MG tablet take 1 tablet by mouth at bedtime 09/11/10  Yes Kathlen Brunswick, MD   No Known Allergies    Past medical history, social history, and family history reviewed and  updated.  ROS: Denies orthopnea, PND, chest pain, dyspnea, palpitations or syncope.  All other systems reviewed and are negative.  PHYSICAL EXAM: BP 110/69  Pulse 56  Ht 6\' 2"  (1.88 m)  Wt 92.534 kg (204 lb)  BMI 26.19 kg/m2  General-Well developed; no acute distress Body habitus-proportionate weight and height Neck-No JVD; no carotid bruits Lungs-clear lung fields; resonant to percussion Cardiovascular-normal PMI; normal S1 and S2; grade 1/6 apical systolic murmur Abdomen-normal bowel sounds; soft and non-tender without masses or organomegaly Musculoskeletal-No deformities, no cyanosis or clubbing Neurologic-Normal cranial nerves; symmetric strength and tone Skin-Warm, no significant lesions Extremities-distal pulses intact; no edema  ASSESSMENT AND PLAN:  Timothy Bing, MD 09/06/2011 1:49 PM

## 2011-09-06 NOTE — Assessment & Plan Note (Signed)
No recent assessment of lipids-a profile will be obtained. 

## 2011-09-06 NOTE — Assessment & Plan Note (Addendum)
Patient is doing very well with respect to coronary artery disease, Currently with no symptoms to suggest myocardial ischemia.  Since he is mildly orthostatic and has symptoms likely related to postural hypotension, we will cut back his regimen by decreasing amlodipine to 5 mg per day and discontinuing isosorbide mononitrate.  He will call for continued lightheadedness or recurrent angina.

## 2011-09-06 NOTE — Patient Instructions (Signed)
Your physician recommends that you schedule a follow-up appointment in: 1 year  Your physician has recommended you make the following change in your medication:  1 - DECREASE Amlodipine to 5 mg daily 2 - DECREASE Aspirin to 81 mg daily 3 - STOP Imdur  Your physician has requested that you regularly monitor and record your blood pressure readings at home. Please use the same machine at the same time of day to check your readings and record them to bring to your follow-up visit.  Your physician recommends that you return for lab work in: Within the week

## 2011-09-06 NOTE — Assessment & Plan Note (Signed)
Blood pressure control he is perhaps too good.  Regime will be modified as noted above.

## 2011-09-22 ENCOUNTER — Other Ambulatory Visit: Payer: Self-pay | Admitting: Cardiology

## 2011-10-01 ENCOUNTER — Encounter: Payer: Self-pay | Admitting: Gastroenterology

## 2011-10-19 ENCOUNTER — Ambulatory Visit (AMBULATORY_SURGERY_CENTER): Payer: PRIVATE HEALTH INSURANCE

## 2011-10-19 VITALS — Ht 74.0 in | Wt 204.0 lb

## 2011-10-19 DIAGNOSIS — Z1211 Encounter for screening for malignant neoplasm of colon: Secondary | ICD-10-CM

## 2011-10-19 MED ORDER — PEG-KCL-NACL-NASULF-NA ASC-C 100 G PO SOLR: 1.0000 | Freq: Once | ORAL | Status: DC

## 2011-11-01 ENCOUNTER — Other Ambulatory Visit: Payer: Self-pay | Admitting: *Deleted

## 2011-11-01 ENCOUNTER — Encounter: Payer: Self-pay | Admitting: Gastroenterology

## 2011-11-01 ENCOUNTER — Ambulatory Visit (AMBULATORY_SURGERY_CENTER): Payer: PRIVATE HEALTH INSURANCE | Admitting: Gastroenterology

## 2011-11-01 VITALS — BP 124/75 | HR 54 | Temp 98.5°F | Resp 17 | Ht 74.0 in | Wt 204.0 lb

## 2011-11-01 DIAGNOSIS — E782 Mixed hyperlipidemia: Secondary | ICD-10-CM

## 2011-11-01 DIAGNOSIS — Z800 Family history of malignant neoplasm of digestive organs: Secondary | ICD-10-CM

## 2011-11-01 DIAGNOSIS — Z1211 Encounter for screening for malignant neoplasm of colon: Secondary | ICD-10-CM

## 2011-11-01 MED ORDER — SODIUM CHLORIDE 0.9 % IV SOLN: 500.0000 mL | INTRAVENOUS | Status: DC

## 2011-11-01 NOTE — Progress Notes (Signed)
Patient did not experience any of the following events: a burn prior to discharge; a fall within the facility; wrong site/side/patient/procedure/implant event; or a hospital transfer or hospital admission upon discharge from the facility. (G8907) Patient did not have preoperative order for IV antibiotic SSI prophylaxis. (G8918)  

## 2011-11-01 NOTE — Progress Notes (Addendum)
Propofol per k [facility]gers crna. See scanned crna intra procedure report. ewm  Pt tolerated procedure well. ewm

## 2011-11-01 NOTE — Op Note (Signed)
 [facility] [address] [place], [place]  [address]  COLONOSCOPY PROCEDURE REPORT  PATIENT:  Timothy Shaw, Timothy Shaw  MR#:  [id number] BIRTHDATE:  Mar 31, 1956, 55 yrs. old  GENDER:  male ENDOSCOPIST:  Barbette Hair. Arlyce Dice, MD REF. BY:  Kari Baars, M.D. PROCEDURE DATE:  11/01/2011 PROCEDURE:  Diagnostic Colonoscopy ASA CLASS:  Class II INDICATIONS:  Screening, family history of colon cancer Sibling MEDICATIONS:   MAC sedation, administered by CRNA propofol 240mg IV  DESCRIPTION OF PROCEDURE:   After the risks benefits and alternatives of the procedure were thoroughly explained, informed consent was obtained.  Digital rectal exam was performed and revealed no abnormalities.   The  endoscope was introduced through the anus and advanced to the cecum, which was identified by both the appendix and ileocecal valve, without limitations.  The quality of the prep was excellent, using MoviPrep.  The instrument was then slowly withdrawn as the colon was fully examined. <<PROCEDUREIMAGES>>  FINDINGS:  A normal appearing cecum, ileocecal valve, and appendiceal orifice were identified. The ascending, hepatic flexure, transverse, splenic flexure, descending, sigmoid colon, and rectum appeared unremarkable (see image1 and image2). Retroflexed views in the rectum revealed no abnormalities.    The time to cecum =  1) 3.50  minutes. The scope was then withdrawn in 1) 6.25  minutes from the cecum and the procedure completed. COMPLICATIONS:  None ENDOSCOPIC IMPRESSION: 1) Normal colon RECOMMENDATIONS: 1) Given your significant family history of colon cancer, you should have a repeat colonoscopy in 5 years REPEAT EXAM:  No  ______________________________ Barbette Hair. Arlyce Dice, MD  CC:  n. eSIGNED:   Barbette Hair. Reatha Sur at 11/01/2011 01:58 PM  Margarita Rana, [id number]

## 2011-11-01 NOTE — Patient Instructions (Addendum)
YOU HAD AN ENDOSCOPIC PROCEDURE TODAY AT THE [facility]: Refer to the procedure report that was given to you for any specific questions about what was found during the examination.  If the procedure report does not answer your questions, please call your gastroenterologist to clarify.  If you requested that your care partner not be given the details of your procedure findings, then the procedure report has been included in a sealed envelope for you to review at your convenience later.  YOU SHOULD EXPECT: Some feelings of bloating in the abdomen. Passage of more gas than usual.  Walking can help get rid of the air that was put into your GI tract during the procedure and reduce the bloating. If you had a lower endoscopy (such as a colonoscopy or flexible sigmoidoscopy) you may notice spotting of blood in your stool or on the toilet paper. If you underwent a bowel prep for your procedure, then you may not have a normal bowel movement for a few days.  DIET: Your first meal following the procedure should be a light meal and then it is ok to progress to your normal diet.  A half-sandwich or bowl of soup is an example of a good first meal.  Heavy or fried foods are harder to digest and may make you feel nauseous or bloated.  Likewise meals heavy in dairy and vegetables can cause extra gas to form and this can also increase the bloating.  Drink plenty of fluids but you should avoid alcoholic beverages for 24 hours.  ACTIVITY: Your care partner should take you home directly after the procedure.  You should plan to take it easy, moving slowly for the rest of the day.  You can resume normal activity the day after the procedure however you should NOT DRIVE or use heavy machinery for 24 hours (because of the sedation medicines used during the test).    SYMPTOMS TO REPORT IMMEDIATELY: A gastroenterologist can be reached at any hour.  During normal business hours, 8:30 AM to 5:00 PM Monday through Friday,  call [phone].  After hours and on weekends, please call the GI answering service at [phone] who will take a message and have the physician on call contact you.   Following lower endoscopy (colonoscopy or flexible sigmoidoscopy):  Excessive amounts of blood in the stool  Significant tenderness or worsening of abdominal pains  Swelling of the abdomen that is new, acute  Fever of 100F or higher  FOLLOW UP: If any biopsies were taken you will be contacted by phone or by letter within the next 1-3 weeks.  Call your gastroenterologist if you have not heard about the biopsies in 3 weeks.  Our staff will call the home number listed on your records the next business day following your procedure to check on you and address any questions or concerns that you may have at that time regarding the information given to you following your procedure. This is a courtesy call and so if there is no answer at the home number and we have not heard from you through the emergency physician on call, we will assume that you have returned to your regular daily activities without incident.  SIGNATURES/CONFIDENTIALITY: You and/or your care partner have signed paperwork which will be entered into your electronic medical record.  These signatures attest to the fact that that the information above on your After Visit Summary has been reviewed and is understood.  Full responsibility of the confidentiality of this   discharge information lies with you and/or your care-partner.  Repeat colonoscopy in 5 years 

## 2011-11-02 ENCOUNTER — Telehealth: Payer: Self-pay | Admitting: *Deleted

## 2011-11-02 NOTE — Telephone Encounter (Signed)
  Follow up Call-  Call back number 11/01/2011  Post procedure Call Back phone  # [phone]  Permission to leave phone message Yes     Patient questions:  Do you have a fever, pain , or abdominal swelling? no Pain Score  0 *  Have you tolerated food without any problems? yes  Have you been able to return to your normal activities? yes  Do you have any questions about your discharge instructions: Diet   no Medications  no Follow up visit  no  Do you have questions or concerns about your Care? yes  Actions: * If pain score is 4 or above: No action needed, pain <4.

## 2011-11-09 ENCOUNTER — Other Ambulatory Visit: Payer: Self-pay | Admitting: Cardiology

## 2011-11-23 ENCOUNTER — Encounter: Payer: Self-pay | Admitting: *Deleted

## 2011-11-23 ENCOUNTER — Other Ambulatory Visit: Payer: Self-pay | Admitting: *Deleted

## 2011-11-23 DIAGNOSIS — E782 Mixed hyperlipidemia: Secondary | ICD-10-CM

## 2011-11-29 LAB — LIPID PANEL
Cholesterol: 171 mg/dL (ref 0–200)
HDL: 48 mg/dL (ref >39)
LDL Cholesterol: 97 mg/dL (ref 0–99)
Total CHOL/HDL Ratio: 3.6 Ratio
Triglycerides: 130 mg/dL (ref <150)
VLDL: 26 mg/dL (ref 0–40)

## 2011-12-03 ENCOUNTER — Encounter: Payer: Self-pay | Admitting: *Deleted

## 2011-12-14 ENCOUNTER — Other Ambulatory Visit: Payer: Self-pay | Admitting: Cardiology

## 2012-01-11 ENCOUNTER — Other Ambulatory Visit: Payer: Self-pay | Admitting: Cardiology

## 2012-08-03 ENCOUNTER — Other Ambulatory Visit: Payer: Self-pay | Admitting: Cardiology

## 2012-08-22 ENCOUNTER — Other Ambulatory Visit: Payer: Self-pay | Admitting: Cardiology

## 2012-08-22 NOTE — Telephone Encounter (Signed)
rx sent to pharmacy by e-script for one month supply per recall letter sent out for pt to schedule f/u apt with MD RR

## 2012-09-25 ENCOUNTER — Other Ambulatory Visit: Payer: Self-pay | Admitting: Cardiology

## 2012-10-03 ENCOUNTER — Other Ambulatory Visit: Payer: Self-pay | Admitting: Cardiology

## 2012-11-01 ENCOUNTER — Other Ambulatory Visit: Payer: Self-pay | Admitting: Cardiology

## 2012-11-03 NOTE — Telephone Encounter (Signed)
rx sent to pharmacy by e-script Scheduled pt for overdue f/u on 11-12-12 at 2:45pm with RR

## 2012-11-12 ENCOUNTER — Ambulatory Visit: Payer: PRIVATE HEALTH INSURANCE | Admitting: Cardiology

## 2012-12-15 ENCOUNTER — Ambulatory Visit (INDEPENDENT_AMBULATORY_CARE_PROVIDER_SITE_OTHER): Payer: Medicare Other | Admitting: Adult Health

## 2012-12-15 ENCOUNTER — Encounter: Payer: Self-pay | Admitting: Adult Health

## 2012-12-15 VITALS — BP 107/67 | HR 54 | Ht 74.0 in | Wt 207.0 lb

## 2012-12-15 DIAGNOSIS — I2510 Atherosclerotic heart disease of native coronary artery without angina pectoris: Secondary | ICD-10-CM

## 2012-12-15 DIAGNOSIS — E785 Hyperlipidemia, unspecified: Secondary | ICD-10-CM

## 2012-12-15 DIAGNOSIS — I10 Essential (primary) hypertension: Secondary | ICD-10-CM

## 2012-12-15 DIAGNOSIS — I7090 Unspecified atherosclerosis: Secondary | ICD-10-CM

## 2012-12-15 MED ORDER — METOPROLOL TARTRATE 50 MG PO TABS: ORAL_TABLET | ORAL | Status: DC

## 2012-12-15 MED ORDER — AMLODIPINE BESYLATE 5 MG PO TABS: ORAL_TABLET | ORAL | Status: DC

## 2012-12-15 NOTE — Progress Notes (Signed)
HPI: Timothy Shaw is a 57 year old patient of [name] we are following for ongoing assessment and management of CAD, with history of hypertension and hypercholesterolemia. His last seen by [name] in April of 2013 and was clinically stable. On last visit his amlodipine was decreased to 5 mg daily and isosorbide was discontinued.   Patient has a without complaint. He said no hospitalizations, ER visits, or new diagnoses. The patient is tolerating medications as directed without evidence of dizziness, chest discomfort, or headache. He states he feels better off of nitrates. If not had labs completed in over a year, is due to followup with [name] primary care physician. He remains active, he is working alternating 12 hour shifts days/night which sometimes causes him to have some fatigue.  No Known Allergies  Current Outpatient Prescriptions  Medication Sig Dispense Refill  . amLODipine (NORVASC) 5 MG tablet take 1 tablet once daily  30 tablet  12  . aspirin 81 MG tablet Take 81 mg by mouth daily.      . calcium carbonate (TUMS - DOSED IN MG ELEMENTAL CALCIUM) 500 MG chewable tablet Chew 1 tablet by mouth daily.        Marland Kitchen esomeprazole (NEXIUM) 40 MG capsule Take 40 mg by mouth daily before breakfast.        . fish oil-omega-3 fatty acids 1000 MG capsule Take 2 g by mouth daily.        . metoprolol (LOPRESSOR) 50 MG tablet take 1 tablet by mouth twice a day  60 tablet  6  . NITROSTAT 0.4 MG SL tablet place 1 tablet under the tongue AT ONSET OF CHEST PAIN; YOU MAY REPEAT EVERY 5 MINUTES FOR UP TO 3 DOSES  25 tablet  3  . pravastatin (PRAVACHOL) 80 MG tablet take 1 tablet by mouth once daily  30 tablet  5   No current facility-administered medications for this visit.    Past Medical History  Diagnosis Date  . Arteriosclerotic cardiovascular disease (ASCVD)     with angina: 09/2009-100% proximal RCA; 50% proximal&60% mid LAD; 30-50% mid-Cx, normal EF  . Hyperlipidemia     Total  cholesterol-136, triglycerides of 89, HDL 48 and LDL of 70 in 03/2010  . Hypertension     Left ventricular hypertrophy  . Gastroesophageal reflux disease   . Anxiety disorder   . Arthritis     Lyme disease in 10/2009 treated with antibiotics  . Chronic headaches   . Tobacco abuse, in remission     20 pack years; mild airway obstruction on PFTs in 2010  . Anxiety     Past Surgical History  Procedure Laterality Date  . Umbilical hernia repair  2003  . Appendectomy  1970  . Tonsillectomy  1962  . Nasal sinus surgery    . Knee arthroscopy w/ meniscal repair  2006, 2012  . Colonoscopy  2008  . Inguinal hernia repair      ROS: Review of systems complete and found to be negative unless listed above  PHYSICAL EXAM BP 107/67  Pulse 54  Ht 6\' 2"  (1.88 m)  Wt 207 lb (93.895 kg)  BMI 26.57 kg/m2  General: Well developed, well nourished, in no acute distress Head: Eyes PERRLA, No xanthomas.   Normal cephalic and atramatic  Lungs: Clear bilaterally to auscultation and percussion. Heart: HRRR S1 S2, without MRG.  Pulses are 2+ & equal.            No carotid bruit. No JVD.  No abdominal bruits. No femoral bruits. Abdomen: Bowel sounds are positive, abdomen soft and non-tender without masses or                  Hernia's noted. Msk:  Back normal, normal gait. Normal strength and tone for age. Extremities: No clubbing, cyanosis or edema.  DP +1 Neuro: Alert and oriented X 3. Psych:  Good affect, responds appropriately  EKG: Sinus bradycardia rate 52 beats per minute and  ASSESSMENT AND PLAN

## 2012-12-15 NOTE — Assessment & Plan Note (Signed)
Patient is due for fasting lipids LFTs and other labs. His access to go ahead and get this drawn with copy to [name] for his followup PCP visit. He will have a cemented, TSH, hemoglobin A1c, PSA, and CBC drawn as well.

## 2012-12-15 NOTE — Assessment & Plan Note (Signed)
Blood pressure is low normal. Would consider decreasing the Toprol to 25 mg twice a day if he becomes symptomatic. He is not orthostatic in his subjective evaluation. He denies dizziness with standing or position change.

## 2012-12-15 NOTE — Patient Instructions (Addendum)
Your physician recommends that you schedule a follow-up appointment in: 12 months You will receive a reminder letter in the mail in about 10 months reminding you to call and schedule your appointment. If you don't receive this letter, please contact our office.  Your physician recommends that you continue on your current medications as directed. Please refer to the Current Medication list given to you today.  

## 2012-12-15 NOTE — Assessment & Plan Note (Signed)
Patient is asymptomatic. I am concerned that he is bradycardic on 50 mg of metoprolol twice a day. He is asymptomatic from this, and has had no angina. If he does become dizzy lightheaded, unable to increase heart rate during exertion, recommend decreasing to 25 mg twice a day. Blood pressure is low normal.

## 2012-12-15 NOTE — Progress Notes (Deleted)
Name: Timothy Shaw    DOB: 1955-12-31  Age: 57 y.o.  MR#: [id number]       PCP:  Fredirick Maudlin, MD      Insurance: Payor: [employer] [place] [employer] / Plan: BLUE MEDICARE / Product Type: *No Product type* /   CC:    Chief Complaint  Patient presents with  . Coronary Artery Disease  . Hypertension    VS Filed Vitals:   12/15/12 1134  BP: 107/67  Pulse: 54  Height: 6\' 2"  (1.88 m)  Weight: 207 lb (93.895 kg)    Weights Current Weight  12/15/12 207 lb (93.895 kg)  11/01/11 204 lb (92.534 kg)  10/19/11 204 lb (92.534 kg)    Blood Pressure  BP Readings from Last 3 Encounters:  12/15/12 107/67  11/01/11 124/75  09/06/11 102/63     Admit date:  (Not on file) Last encounter with RMR:  Visit date not found   Allergy Review of patient's allergies indicates no known allergies.  Current Outpatient Prescriptions  Medication Sig Dispense Refill  . amLODipine (NORVASC) 5 MG tablet take 1 tablet once daily  30 tablet  12  . aspirin 81 MG tablet Take 81 mg by mouth daily.      . calcium carbonate (TUMS - DOSED IN MG ELEMENTAL CALCIUM) 500 MG chewable tablet Chew 1 tablet by mouth daily.        Marland Kitchen esomeprazole (NEXIUM) 40 MG capsule Take 40 mg by mouth daily before breakfast.        . fish oil-omega-3 fatty acids 1000 MG capsule Take 2 g by mouth daily.        . metoprolol (LOPRESSOR) 50 MG tablet take 1 tablet by mouth twice a day  60 tablet  3  . NITROSTAT 0.4 MG SL tablet place 1 tablet under the tongue AT ONSET OF CHEST PAIN; YOU MAY REPEAT EVERY 5 MINUTES FOR UP TO 3 DOSES  25 tablet  3  . pravastatin (PRAVACHOL) 80 MG tablet take 1 tablet by mouth once daily  30 tablet  5   No current facility-administered medications for this visit.    Discontinued Meds:    Medications Discontinued During This Encounter  Medication Reason  . HYDROcodone-acetaminophen (NORCO) 7.5-325 MG per tablet Error    Patient Active Problem List   Diagnosis Date Noted  .  Arteriosclerotic cardiovascular disease (ASCVD)   . Hyperlipidemia   . Hypertension 09/22/2010  . ANXIETY 11/10/2009  . Gastroesophageal reflux disease 12/03/2008    LABS    Component Value Date/Time   NA 138 09/21/2010 1348   NA 139 01/03/2010 1939   NA 139 01/03/2010   K 4.5 09/21/2010 1348   K 4.7 01/03/2010 1939   K 4.7 01/03/2010   CL 102 09/21/2010 1348   CL 106 01/03/2010 1939   CL 106 01/03/2010   CO2 28 09/21/2010 1348   CO2 22 01/03/2010 1939   CO2 22 01/03/2010   GLUCOSE 107* 09/21/2010 1348   GLUCOSE 110* 01/03/2010 1939   GLUCOSE 110 01/03/2010   BUN 13 09/21/2010 1348   BUN 20 01/03/2010 1939   BUN 20 01/03/2010   CREATININE 1.20 09/21/2010 1348   CREATININE 1.01 01/03/2010 1939   CREATININE 1.01 01/03/2010   CALCIUM 9.2 09/21/2010 1348   CALCIUM 9.5 01/03/2010 1939   CALCIUM 9.5 01/03/2010   GFRNONAA >60 09/21/2010 1348   GFRAA  Value: >60        The eGFR has been  calculated using the MDRD equation. This calculation has not been validated in all clinical situations. eGFR's persistently <60 mL/min signify possible Chronic Kidney Disease. 09/21/2010 1348   CMP     Component Value Date/Time   NA 138 09/21/2010 1348   K 4.5 09/21/2010 1348   CL 102 09/21/2010 1348   CO2 28 09/21/2010 1348   GLUCOSE 107* 09/21/2010 1348   BUN 13 09/21/2010 1348   CREATININE 1.20 09/21/2010 1348   CALCIUM 9.2 09/21/2010 1348   PROT 7.0 09/21/2010 1348   ALBUMIN 4.2 09/21/2010 1348   AST 20 09/21/2010 1348   ALT 22 09/21/2010 1348   ALKPHOS 55 09/21/2010 1348   BILITOT 0.5 09/21/2010 1348   GFRNONAA >60 09/21/2010 1348   GFRAA  Value: >60        The eGFR has been calculated using the MDRD equation. This calculation has not been validated in all clinical situations. eGFR's persistently <60 mL/min signify possible Chronic Kidney Disease. 09/21/2010 1348       Component Value Date/Time   WBC 6.9 09/21/2010 1348   WBC 8.3 09/13/2009 2228   WBC 8.3 09/13/2009   WBC 8.3 09/13/2009   HGB 13.7 09/21/2010 1348   HGB 13.2 09/13/2009  2228   HGB 13.2 09/13/2009   HGB 13.2 09/13/2009   HCT 41.1 09/21/2010 1348   HCT 40.2 09/13/2009 2228   HCT 40.2 09/13/2009   HCT 40.2 09/13/2009   MCV 87.4 09/21/2010 1348   MCV 86.8 09/13/2009 2228   MCV 86.8 09/13/2009   MCV 86.8 09/13/2009    Lipid Panel     Component Value Date/Time   CHOL 171 11/29/2011 0725   TRIG 130 11/29/2011 0725   HDL 48 11/29/2011 0725   CHOLHDL 3.6 11/29/2011 0725   VLDL 26 11/29/2011 0725   LDLCALC 97 11/29/2011 0725   LDLCALC 82 01/17/2010 0000    ABG    Component Value Date/Time   PHART 7.407 06/22/2008 0914   PCO2ART 38.9 06/22/2008 0914   PO2ART 90.0 06/22/2008 0914   HCO3 24.0 06/22/2008 0914   TCO2 21.2 06/22/2008 0914   ACIDBASEDEF 0.1 06/22/2008 0914   O2SAT 96.6 06/22/2008 0914     No results found for this basename: TSH   BNP (last 3 results) No results found for this basename: PROBNP,  in the last 8760 hours Cardiac Panel (last 3 results) No results found for this basename: CKTOTAL, CKMB, TROPONINI, RELINDX,  in the last 72 hours  Iron/TIBC/Ferritin No results found for this basename: iron, tibc, ferritin     EKG Orders placed in visit on 12/15/12  . EKG 12-LEAD     Prior Assessment and Plan Problem List as of 12/15/2012     Cardiovascular and Mediastinum   Hypertension   Last Assessment & Plan   09/06/2011 Office Visit Written 09/06/2011  2:26 PM by Kathlen Brunswick, MD     Blood pressure control he is perhaps too good.  Regime will be modified as noted above.    Arteriosclerotic cardiovascular disease (ASCVD)   Last Assessment & Plan   09/06/2011 Office Visit Edited 09/13/2011  8:12 AM by Kathlen Brunswick, MD     Patient is doing very well with respect to coronary artery disease, Currently with no symptoms to suggest myocardial ischemia.  Since he is mildly orthostatic and has symptoms likely related to postural hypotension, we will cut back his regimen by decreasing amlodipine to 5 mg per day and discontinuing isosorbide mononitrate.  He  will call for continued lightheadedness or recurrent angina.      Digestive   Gastroesophageal reflux disease     Other   ANXIETY   Hyperlipidemia   Last Assessment & Plan   09/06/2011 Office Visit Written 09/06/2011  2:26 PM by Kathlen Brunswick, MD     No recent assessment of lipids; a profile will be obtained.        Imaging: No results found.

## 2012-12-16 LAB — COMPREHENSIVE METABOLIC PANEL
ALT: 19 U/L (ref 0–53)
AST: 17 U/L (ref 0–37)
Albumin: 4 g/dL (ref 3.5–5.2)
Alkaline Phosphatase: 58 U/L (ref 39–117)
BUN: 15 mg/dL (ref 6–23)
CO2: 28 mEq/L (ref 19–32)
Calcium: 9.3 mg/dL (ref 8.4–10.5)
Chloride: 106 mEq/L (ref 96–112)
Creat: 0.99 mg/dL (ref 0.50–1.35)
Glucose, Bld: 102 mg/dL — ABNORMAL HIGH (ref 70–99)
Potassium: 4.9 mEq/L (ref 3.5–5.3)
Sodium: 143 mEq/L (ref 135–145)
Total Bilirubin: 0.4 mg/dL (ref 0.3–1.2)
Total Protein: 6.7 g/dL (ref 6.0–8.3)

## 2012-12-16 LAB — LIPID PANEL
Cholesterol: 131 mg/dL (ref 0–200)
HDL: 41 mg/dL (ref >39)
LDL Cholesterol: 75 mg/dL (ref 0–99)
Total CHOL/HDL Ratio: 3.2 Ratio
Triglycerides: 77 mg/dL (ref <150)
VLDL: 15 mg/dL (ref 0–40)

## 2012-12-16 LAB — HEMOGLOBIN A1C
Hgb A1c MFr Bld: 5.3 % (ref <5.7)
Mean Plasma Glucose: 105 mg/dL (ref <117)

## 2012-12-16 LAB — TSH: TSH: 1.47 u[IU]/mL (ref 0.350–4.500)

## 2012-12-16 LAB — CBC
HCT: 39.3 % (ref 39.0–52.0)
Hemoglobin: 13.6 g/dL (ref 13.0–17.0)
MCH: 29.1 pg (ref 26.0–34.0)
MCHC: 34.6 g/dL (ref 30.0–36.0)
MCV: 84 fL (ref 78.0–100.0)
Platelets: 298 10*3/uL (ref 150–400)
RBC: 4.68 MIL/uL (ref 4.22–5.81)
RDW: 13.2 % (ref 11.5–15.5)
WBC: 4.9 10*3/uL (ref 4.0–10.5)

## 2012-12-16 LAB — PSA: PSA: 2.61 ng/mL (ref <=4.00)

## 2012-12-18 ENCOUNTER — Encounter: Payer: Self-pay | Admitting: *Deleted

## 2012-12-18 ENCOUNTER — Other Ambulatory Visit: Payer: Self-pay | Admitting: *Deleted

## 2012-12-18 DIAGNOSIS — E7800 Pure hypercholesterolemia, unspecified: Secondary | ICD-10-CM | POA: Insufficient documentation

## 2013-05-11 ENCOUNTER — Other Ambulatory Visit: Payer: Self-pay | Admitting: Cardiology

## 2013-07-08 ENCOUNTER — Telehealth: Payer: Self-pay | Admitting: *Deleted

## 2013-07-08 DIAGNOSIS — E785 Hyperlipidemia, unspecified: Secondary | ICD-10-CM

## 2013-07-08 DIAGNOSIS — I2510 Atherosclerotic heart disease of native coronary artery without angina pectoris: Secondary | ICD-10-CM

## 2013-07-08 DIAGNOSIS — I10 Essential (primary) hypertension: Secondary | ICD-10-CM

## 2013-07-08 MED ORDER — METOPROLOL TARTRATE 50 MG PO TABS: ORAL_TABLET | ORAL | Status: DC

## 2013-07-08 NOTE — Telephone Encounter (Signed)
Medication sent via escribe.  

## 2013-07-08 NOTE — Telephone Encounter (Signed)
[facility]  Metoprolol tart 50 mg 1 tab twice day #30`

## 2013-11-02 ENCOUNTER — Telehealth: Payer: Self-pay | Admitting: Adult Health

## 2013-11-02 MED ORDER — PRAVASTATIN SODIUM 80 MG PO TABS: ORAL_TABLET | ORAL | Status: DC

## 2013-11-02 NOTE — Telephone Encounter (Signed)
Medication sent via escribe.  

## 2013-11-02 NOTE — Telephone Encounter (Signed)
Received fax refill request  Rx # [id number] Medication:  Pravastatin Sodium 80 mg tab Qty 30 Sig:  Take one tablet once daily Physician:  Purcell Nails

## 2013-12-31 ENCOUNTER — Telehealth: Payer: Self-pay | Admitting: Adult Health

## 2013-12-31 DIAGNOSIS — E785 Hyperlipidemia, unspecified: Secondary | ICD-10-CM

## 2013-12-31 DIAGNOSIS — I2510 Atherosclerotic heart disease of native coronary artery without angina pectoris: Secondary | ICD-10-CM

## 2013-12-31 MED ORDER — AMLODIPINE BESYLATE 5 MG PO TABS: ORAL_TABLET | ORAL | Status: DC

## 2013-12-31 NOTE — Telephone Encounter (Signed)
Received fax refill request  Rx # [id number] Medication:  Amlodipine Besylate 5 mg tab Qty 30 Sig:  Take one tablet by mouth once daily Physician:  Purcell Nails

## 2014-02-05 ENCOUNTER — Telehealth: Payer: Self-pay | Admitting: Adult Health

## 2014-02-05 DIAGNOSIS — E785 Hyperlipidemia, unspecified: Secondary | ICD-10-CM

## 2014-02-05 DIAGNOSIS — I2510 Atherosclerotic heart disease of native coronary artery without angina pectoris: Secondary | ICD-10-CM

## 2014-02-05 DIAGNOSIS — I10 Essential (primary) hypertension: Secondary | ICD-10-CM

## 2014-02-05 MED ORDER — METOPROLOL TARTRATE 50 MG PO TABS
ORAL_TABLET | ORAL | Status: DC
Start: 2014-02-05 — End: 2014-02-26

## 2014-02-05 NOTE — Telephone Encounter (Signed)
Received fax refill request  Rx # [id number] Medication:  Metoprolol Tartrate 50 mg tab Qty 60 Sig:  Take one tablet by mouth twice daily Physician:  Purcell Nails Received fax refill request  Rx # [id number] Medication:  Pravastatin Sodium 80 mg tablet Qty 30 Sig:  Take one tablet by mouth once daily Physician:  Purcell Nails Received fax refill request  Rx # [id number] Medication:  Amlodipine Besylate 5 mg tab Qty 30 Sig:  Take one tablet by mouth once daily Physician:  Purcell Nails

## 2014-02-15 ENCOUNTER — Telehealth: Payer: Self-pay | Admitting: Adult Health

## 2014-02-15 MED ORDER — PRAVASTATIN SODIUM 80 MG PO TABS: ORAL_TABLET | ORAL | Status: DC

## 2014-02-15 NOTE — Telephone Encounter (Signed)
Received fax refill request  Rx # [id number] Medication:  Pravastatin Sodium 80 mg tab Qty 30 Sig:  Take one tablet by mouth once daily Physician:  Purcell Nails Received fax refill request  Rx # [id number] Medication:  Amlodipine Besylate 5 mg tab Qty 30 Sig:  Take one tablet by mouth once daily Physician:  Purcell Nails

## 2014-02-24 ENCOUNTER — Telehealth: Payer: Self-pay | Admitting: Cardiovascular Disease

## 2014-02-24 DIAGNOSIS — I2510 Atherosclerotic heart disease of native coronary artery without angina pectoris: Secondary | ICD-10-CM

## 2014-02-24 DIAGNOSIS — E785 Hyperlipidemia, unspecified: Secondary | ICD-10-CM

## 2014-02-24 MED ORDER — AMLODIPINE BESYLATE 5 MG PO TABS: ORAL_TABLET | ORAL | Status: DC

## 2014-02-24 NOTE — Telephone Encounter (Signed)
Refill complete 

## 2014-02-24 NOTE — Telephone Encounter (Signed)
Needs refill on Amlodipine sent to [facility].  Patient scheduled his 1 yr f/u with Dr.Koneswaran for Friday. / tgs

## 2014-02-26 ENCOUNTER — Ambulatory Visit (INDEPENDENT_AMBULATORY_CARE_PROVIDER_SITE_OTHER): Payer: BC Managed Care – PPO | Admitting: Cardiovascular Disease

## 2014-02-26 ENCOUNTER — Encounter: Payer: Self-pay | Admitting: Cardiovascular Disease

## 2014-02-26 VITALS — BP 122/72 | HR 58 | Ht 74.0 in | Wt 208.0 lb

## 2014-02-26 DIAGNOSIS — E785 Hyperlipidemia, unspecified: Secondary | ICD-10-CM

## 2014-02-26 DIAGNOSIS — I10 Essential (primary) hypertension: Secondary | ICD-10-CM

## 2014-02-26 DIAGNOSIS — I2510 Atherosclerotic heart disease of native coronary artery without angina pectoris: Secondary | ICD-10-CM

## 2014-02-26 DIAGNOSIS — I7090 Unspecified atherosclerosis: Secondary | ICD-10-CM

## 2014-02-26 MED ORDER — PRAVASTATIN SODIUM 80 MG PO TABS: ORAL_TABLET | ORAL | Status: DC

## 2014-02-26 MED ORDER — AMLODIPINE BESYLATE 5 MG PO TABS
ORAL_TABLET | ORAL | Status: DC
Start: 2014-02-26 — End: 2014-11-09

## 2014-02-26 MED ORDER — METOPROLOL TARTRATE 50 MG PO TABS: ORAL_TABLET | ORAL | Status: DC

## 2014-02-26 NOTE — Patient Instructions (Signed)
Your physician wants you to follow-up in: 1 year You will receive a reminder letter in the mail two months in advance. If you don't receive a letter, please call our office to schedule the follow-up appointment.     Your physician recommends that you continue on your current medications as directed. Please refer to the Current Medication list given to you today.     Please get lab work FASTING (LFT'S,LIPID)      Thank you for choosing [facility] !

## 2014-02-26 NOTE — Progress Notes (Signed)
Patient ID: Timothy Shaw, male   DOB: December 02, 1955, 58 y.o.   MRN: [id number]      SUBJECTIVE: The patient is a 58 year old male who presents for followup of coronary artery disease, hyperlipidemia and hypertension. He has been doing well, and denies chest pain, shortness of breath, palpitations, orthopnea, and leg swelling. He works as a Therapist, occupational at [employer] in [employer].  ECG performed in the office today demonstrates sinus bradycardia, heart rate 56 beats per minute.  Review of Systems: As per "subjective", otherwise negative.  No Known Allergies  Current Outpatient Prescriptions  Medication Sig Dispense Refill  . amLODipine (NORVASC) 5 MG tablet take 1 tablet once daily  30 tablet  6  . aspirin 81 MG tablet Take 81 mg by mouth daily.      . calcium carbonate (TUMS - DOSED IN MG ELEMENTAL CALCIUM) 500 MG chewable tablet Chew 1 tablet by mouth daily.        Marland Kitchen esomeprazole (NEXIUM) 40 MG capsule Take 20 mg by mouth daily before breakfast.       . fish oil-omega-3 fatty acids 1000 MG capsule Take 2 g by mouth daily.        . metoprolol (LOPRESSOR) 50 MG tablet take 1 tablet by mouth twice a day  60 tablet  6  . NITROSTAT 0.4 MG SL tablet place 1 tablet under the tongue AT ONSET OF CHEST PAIN; YOU MAY REPEAT EVERY 5 MINUTES FOR UP TO 3 DOSES  25 tablet  3  . pravastatin (PRAVACHOL) 80 MG tablet take 1 tablet by mouth once daily  30 tablet  11   No current facility-administered medications for this visit.    Past Medical History  Diagnosis Date  . Arteriosclerotic cardiovascular disease (ASCVD)     with angina: 09/2009-100% proximal RCA; 50% proximal&60% mid LAD; 30-50% mid-Cx, normal EF  . Hyperlipidemia     Total cholesterol-136, triglycerides of 89, HDL 48 and LDL of 70 in 03/2010  . Hypertension     Left ventricular hypertrophy  . Gastroesophageal reflux disease   . Anxiety disorder   . Arthritis     Lyme disease in 10/2009 treated with antibiotics  . Chronic headaches    . Tobacco abuse, in remission     20 pack years; mild airway obstruction on PFTs in 2010  . Anxiety     Past Surgical History  Procedure Laterality Date  . Umbilical hernia repair  2003  . Appendectomy  1970  . Tonsillectomy  1962  . Nasal sinus surgery    . Knee arthroscopy w/ meniscal repair  2006, 2012  . Colonoscopy  2008  . Inguinal hernia repair      History   Social History  . Marital Status: Married    Spouse Name: N/A    Number of Children: 1  . Years of Education: N/A   Occupational History  . Maintenance technician    Social History Main Topics  . Smoking status: Former Smoker -- 1.00 packs/day for 20 years    Types: Cigarettes    Start date: 06/29/1971    Quit date: 06/04/1988  . Smokeless tobacco: Never Used  . Alcohol Use: 0.5 oz/week    1 drink(s) per week  . Drug Use: Not on file  . Sexual Activity: Not on file   Other Topics Concern  . Not on file   Social History Narrative  . No narrative on file     Filed Vitals:   02/26/14 [phone]  BP: 122/72  Pulse: 58  Height: 6\' 2"  (1.88 m)  Weight: 208 lb (94.348 kg)    PHYSICAL EXAM General: NAD HEENT: Normal. Neck: No JVD, no thyromegaly. Lungs: Clear to auscultation bilaterally with normal respiratory effort. CV: Nondisplaced PMI.  Regular rate and rhythm, normal S1/S2, no S3/S4, no murmur. No pretibial or periankle edema.  No carotid bruit.  Normal pedal pulses.  Abdomen: Soft, nontender, no hepatosplenomegaly, no distention.  Neurologic: Alert and oriented x 3.  Psych: Normal affect. Skin: Normal. Musculoskeletal: Normal range of motion, no gross deformities. Extremities: No clubbing or cyanosis.   ECG: Most recent ECG reviewed.      ASSESSMENT AND PLAN: 1. CAD: Stable ischemic heart disease. Continue ASA, metoprolol and statin. 2. Essential HTN: Well controlled on present therapy. 3. Hyperlipidemia: Will check lipids and LFT's. On pravastatin 80 mg.  Dispo: f/u 1  year.  Kate Sable, M.D., F.A.C.C.

## 2014-02-27 LAB — HEPATIC FUNCTION PANEL
ALT: 16 U/L (ref 0–53)
AST: 17 U/L (ref 0–37)
Albumin: 4 g/dL (ref 3.5–5.2)
Alkaline Phosphatase: 58 U/L (ref 39–117)
Bilirubin, Direct: 0.1 mg/dL (ref 0.0–0.3)
Indirect Bilirubin: 0.6 mg/dL (ref 0.2–1.2)
Total Bilirubin: 0.7 mg/dL (ref 0.2–1.2)
Total Protein: 6.8 g/dL (ref 6.0–8.3)

## 2014-02-27 LAB — LIPID PANEL
Cholesterol: 137 mg/dL (ref 0–200)
HDL: 46 mg/dL (ref >39)
LDL Cholesterol: 72 mg/dL (ref 0–99)
Total CHOL/HDL Ratio: 3 Ratio
Triglycerides: 94 mg/dL (ref <150)
VLDL: 19 mg/dL (ref 0–40)

## 2014-03-01 ENCOUNTER — Telehealth: Payer: Self-pay

## 2014-03-01 NOTE — Telephone Encounter (Signed)
Message copied by Bernita Raisin on Mon Mar 01, 2014  8:54 AM ------      Message from: Kate Sable A      Created: Mon Mar 01, 2014  8:33 AM       Normal. ------

## 2014-03-01 NOTE — Telephone Encounter (Signed)
LMTCB

## 2014-04-07 ENCOUNTER — Other Ambulatory Visit (HOSPITAL_COMMUNITY): Payer: Self-pay | Admitting: Orthopaedic Surgery

## 2014-04-07 DIAGNOSIS — M25572 Pain in left ankle and joints of left foot: Secondary | ICD-10-CM

## 2014-04-08 ENCOUNTER — Ambulatory Visit (HOSPITAL_COMMUNITY)
Admission: RE | Admit: 2014-04-08 | Discharge: 2014-04-08 | Disposition: A | Discharge status: Home / Self Care | Payer: BC Managed Care – PPO | Source: Ambulatory Visit | Attending: Orthopaedic Surgery | Admitting: Orthopaedic Surgery

## 2014-04-08 DIAGNOSIS — M25572 Pain in left ankle and joints of left foot: Secondary | ICD-10-CM | POA: Insufficient documentation

## 2014-04-08 DIAGNOSIS — M67874 Other specified disorders of tendon, left ankle and foot: Secondary | ICD-10-CM | POA: Insufficient documentation

## 2014-05-06 ENCOUNTER — Other Ambulatory Visit (HOSPITAL_COMMUNITY)
Admission: RE | Admit: 2014-05-06 | Discharge: 2014-05-06 | Disposition: A | Discharge status: Home / Self Care | Payer: BC Managed Care – PPO | Source: Ambulatory Visit | Attending: Orthopaedic Surgery | Admitting: Orthopaedic Surgery

## 2014-05-06 DIAGNOSIS — M65342 Trigger finger, left ring finger: Secondary | ICD-10-CM | POA: Insufficient documentation

## 2014-10-07 ENCOUNTER — Other Ambulatory Visit: Payer: Self-pay

## 2014-10-07 DIAGNOSIS — I2510 Atherosclerotic heart disease of native coronary artery without angina pectoris: Secondary | ICD-10-CM

## 2014-10-07 DIAGNOSIS — I10 Essential (primary) hypertension: Secondary | ICD-10-CM

## 2014-10-07 DIAGNOSIS — E785 Hyperlipidemia, unspecified: Secondary | ICD-10-CM

## 2014-10-07 MED ORDER — METOPROLOL TARTRATE 50 MG PO TABS: ORAL_TABLET | ORAL | Status: DC

## 2014-10-07 NOTE — Telephone Encounter (Signed)
Metoprolol refilled.

## 2014-11-09 ENCOUNTER — Other Ambulatory Visit: Payer: Self-pay

## 2014-11-09 ENCOUNTER — Telehealth: Payer: Self-pay

## 2014-11-09 DIAGNOSIS — I2510 Atherosclerotic heart disease of native coronary artery without angina pectoris: Secondary | ICD-10-CM

## 2014-11-09 DIAGNOSIS — E785 Hyperlipidemia, unspecified: Secondary | ICD-10-CM

## 2014-11-09 MED ORDER — AMLODIPINE BESYLATE 5 MG PO TABS: ORAL_TABLET | ORAL | Status: DC

## 2014-11-09 NOTE — Telephone Encounter (Signed)
error 

## 2015-01-20 ENCOUNTER — Ambulatory Visit (INDEPENDENT_AMBULATORY_CARE_PROVIDER_SITE_OTHER): Payer: Worker's Compensation | Admitting: Family

## 2015-01-20 ENCOUNTER — Encounter: Payer: Self-pay | Admitting: Family

## 2015-01-20 ENCOUNTER — Ambulatory Visit (INDEPENDENT_AMBULATORY_CARE_PROVIDER_SITE_OTHER): Payer: Worker's Compensation

## 2015-01-20 VITALS — BP 135/79 | HR 57 | Temp 97.3°F | Ht 74.0 in | Wt 216.0 lb

## 2015-01-20 DIAGNOSIS — S300XXA Contusion of lower back and pelvis, initial encounter: Secondary | ICD-10-CM | POA: Diagnosis not present

## 2015-01-20 DIAGNOSIS — W19XXXA Unspecified fall, initial encounter: Secondary | ICD-10-CM

## 2015-01-20 DIAGNOSIS — W01198A Fall on same level from slipping, tripping and stumbling with subsequent striking against other object, initial encounter: Secondary | ICD-10-CM

## 2015-01-20 MED ORDER — MELOXICAM 15 MG PO TABS: 15.0000 mg | ORAL_TABLET | Freq: Every day | ORAL | Status: DC

## 2015-01-20 NOTE — Patient Instructions (Signed)
Tailbone Injury  The tailbone (coccyx) is the small bone at the lower end of the spine. A tailbone injury may involve stretched ligaments, bruising, or a broken bone (fracture). Women are more vulnerable to this injury due to having a wider pelvis.  CAUSES   This type of injury typically occurs from falling and landing on the tailbone. Repeated strain or friction from actions such as rowing and bicycling may also injure the area. The tailbone can be injured during childbirth. Infections or tumors may also press on the tailbone and cause pain. Sometimes, the cause of injury is unknown.  SYMPTOMS    Bruising.   Pain when sitting.   Painful bowel movements.   In women, pain during intercourse.  DIAGNOSIS   Your caregiver can diagnose a tailbone injury based on your symptoms and a physical exam. X-rays may be taken if a fracture is suspected. Your caregiver may also use an MRI scan imaging test to evaluate your symptoms.  TREATMENT   Your caregiver may prescribe medicines to help relieve your pain. Most tailbone injuries heal on their own in 4 to 6 weeks. However, if the injury is caused by an infection or tumor, the recovery period may vary.  PREVENTION   Wear appropriate padding and sports gear when bicycling and rowing. This can help prevent an injury from repeated strain or friction.  HOME CARE INSTRUCTIONS    Put ice on the injured area.   Put ice in a plastic bag.   Place a towel between your skin and the bag.   Leave the ice on for 15-20 minutes, every hour while awake for the first 1 to 2 days.   Sit on a large, rubber or inflated ring or cushion to ease your pain. Lean forward when sitting to help decrease discomfort.   Avoid sitting for long periods of time.   Increase your activity as the pain allows.   Only take over-the-counter or prescription medicines for pain, discomfort, or fever as directed by your caregiver.   You may use stool softeners if it is painful to have a bowel movement, or as  directed by your caregiver.   Eat a diet with plenty of fiber to help prevent constipation.   Keep all follow-up appointments as directed by your caregiver.  SEEK MEDICAL CARE IF:    Your pain becomes worse.   Your bowel movements cause a great deal of discomfort.   You are unable to have a bowel movement.   You have a fever.  MAKE SURE YOU:   Understand these instructions.   Will watch your condition.   Will get help right away if you are not doing well or get worse.  Document Released: 05/18/2000 Document Revised: 08/13/2011 Document Reviewed: 12/14/2010  ExitCare Patient Information 2015 [employer]. This information is not intended to replace advice given to you by your health care provider. Make sure you discuss any questions you have with your health care provider.

## 2015-01-20 NOTE — Progress Notes (Signed)
   Subjective:    Patient ID: Timothy Shaw, male    DOB: [birth year], 59 y.o.   MRN: [id number]  Pt presents to the office today for workers comp for [employer]. Pt states he fell yesterday, 01/19/15, on his butt. Pt states he was pulling a machine and there was oil on the concrete and both feel slipped and he fell on the concrete. Pt states he is having constant aching 8 out of 10 pain. Pt states he  Fall The accident occurred 6 to 12 hours ago. The fall occurred while standing. He landed on concrete. There was no blood loss. The point of impact was the buttocks. The pain is present in the buttocks. The pain is at a severity of 8/10. The pain is moderate. The symptoms are aggravated by sitting. Pertinent negatives include no abdominal pain, bowel incontinence, hematuria, loss of consciousness, numbness, tingling or visual change. He has tried NSAID for the symptoms. The treatment provided mild relief.      Review of Systems  Constitutional: Negative.   HENT: Negative.   Respiratory: Negative.   Cardiovascular: Negative.   Gastrointestinal: Negative.  Negative for abdominal pain and bowel incontinence.  Endocrine: Negative.   Genitourinary: Negative.  Negative for hematuria.  Musculoskeletal: Negative.   Neurological: Negative.  Negative for tingling, loss of consciousness and numbness.  Hematological: Negative.   Psychiatric/Behavioral: Negative.   All other systems reviewed and are negative.      Objective:   Physical Exam  Constitutional: He is oriented to person, place, and time. He appears well-developed and well-nourished. No distress.  HENT:  Head: Normocephalic.  Right Ear: External ear normal.  Left Ear: External ear normal.  Nose: Nose normal.  Mouth/Throat: Oropharynx is clear and moist.  Eyes: Pupils are equal, round, and reactive to light. Right eye exhibits no discharge. Left eye exhibits no discharge.  Neck: Normal range of motion. Neck supple. No thyromegaly present.    Cardiovascular: Normal rate, regular rhythm, normal heart sounds and intact distal pulses.   No murmur heard. Pulmonary/Chest: Effort normal and breath sounds normal. No respiratory distress. He has no wheezes.  Abdominal: Soft. Bowel sounds are normal. He exhibits no distension. There is no tenderness.  Musculoskeletal: Normal range of motion. He exhibits no edema or tenderness.  Full ROM, but slow movement with bending and standing related to pain   Neurological: He is alert and oriented to person, place, and time. He has normal reflexes. No cranial nerve deficit.  Skin: Skin is warm and dry. No rash noted. No erythema.  Psychiatric: He has a normal mood and affect. His behavior is normal. Judgment and thought content normal.  Vitals reviewed.   BP 135/79 mmHg  Pulse 57  Temp(Src) 97.3 F (36.3 C) (Oral)  Ht 6\' 2"  (1.88 m)  Wt 216 lb (97.977 kg)  BMI 27.72 kg/m2  Lumbar x-ray: WNL Preliminary reading by Evelina Dun, FNP [facility]      Assessment & Plan:  1. Fall, initial encounter - DG Lumbar Spine 2-3 Views; Future - meloxicam (MOBIC) 15 MG tablet; Take 1 tablet (15 mg total) by mouth daily.  Dispense: 30 tablet; Refill: 0  2. Coccyx contusion, initial encounter -Rest -Ice  -No other NSAID's while taking mobic -Daily stool softener  -Letter given to pt to return to work on 01/24/15 -RTO prn - meloxicam (MOBIC) 15 MG tablet; Take 1 tablet (15 mg total) by mouth daily.  Dispense: 30 tablet; Refill: 0  Evelina Dun, FNP

## 2015-02-14 ENCOUNTER — Telehealth: Payer: Self-pay | Admitting: Family Medicine

## 2015-03-03 ENCOUNTER — Other Ambulatory Visit: Payer: Self-pay

## 2015-03-03 MED ORDER — PRAVASTATIN SODIUM 80 MG PO TABS: ORAL_TABLET | ORAL | Status: DC

## 2015-03-03 NOTE — Telephone Encounter (Signed)
1 month refill given,needs fu apt

## 2015-04-07 ENCOUNTER — Encounter: Payer: Self-pay | Admitting: Cardiovascular Disease

## 2015-04-07 ENCOUNTER — Ambulatory Visit (INDEPENDENT_AMBULATORY_CARE_PROVIDER_SITE_OTHER): Payer: BLUE CROSS/BLUE SHIELD | Admitting: Cardiovascular Disease

## 2015-04-07 VITALS — BP 138/84 | HR 68 | Ht 74.0 in | Wt 216.0 lb

## 2015-04-07 DIAGNOSIS — I10 Essential (primary) hypertension: Secondary | ICD-10-CM | POA: Diagnosis not present

## 2015-04-07 DIAGNOSIS — E785 Hyperlipidemia, unspecified: Secondary | ICD-10-CM

## 2015-04-07 DIAGNOSIS — I25118 Atherosclerotic heart disease of native coronary artery with other forms of angina pectoris: Secondary | ICD-10-CM | POA: Diagnosis not present

## 2015-04-07 MED ORDER — ASPIRIN EC 81 MG PO TBEC: 81.0000 mg | DELAYED_RELEASE_TABLET | Freq: Every day | ORAL | Status: DC

## 2015-04-07 NOTE — Patient Instructions (Signed)
Medication Instructions:  Your physician has recommended you make the following change in your medication:  Decrease Aspirin to 81 mg Daily  Labwork: None  Testing/Procedures: None  Follow-Up: Your physician wants you to follow-up in: 1 Year with Dr. Bronson Ing. You will receive a reminder letter in the mail two months in advance. If you don't receive a letter, please call our office to schedule the follow-up appointment.   Any Other Special Instructions Will Be Listed Below (If Applicable). We have requested a copy of your labs from Dr. Luan Pulling office   If you need a refill on your cardiac medications before your next appointment, please call your pharmacy.  Thank you for choosing [facility]!

## 2015-04-07 NOTE — Progress Notes (Signed)
Patient ID: Timothy Shaw, male   DOB: 03-30-1956, 59 y.o.   MRN: [id number]      SUBJECTIVE: The patient is a 59 year old male who presents for followup of coronary artery disease, hyperlipidemia and hypertension. He has been doing well, and denies chest pain, shortness of breath, palpitations, orthopnea, and leg swelling. He works as a Therapist, occupational at [employer] in [employer]. He has issues related to arthritis he is hoping to speak with his PCP about.  Taking ASA 325 mg daily.  ECG performed in the office today demonstrates normal sinus rhythm with no ischemic ST segment or T-wave abnormalities, nor any arrhythmias.   Review of Systems: As per "subjective", otherwise negative.  No Known Allergies  Current Outpatient Prescriptions  Medication Sig Dispense Refill  . amLODipine (NORVASC) 5 MG tablet take 1 tablet once daily 30 tablet 6  . aspirin 325 MG tablet Take 325 mg by mouth daily. Doesn't take regularly    . calcium carbonate (TUMS - DOSED IN MG ELEMENTAL CALCIUM) 500 MG chewable tablet Chew 1 tablet by mouth daily.      Marland Kitchen esomeprazole (NEXIUM) 40 MG capsule Take 22 mg by mouth daily before breakfast. Over the counter    . fish oil-omega-3 fatty acids 1000 MG capsule Take 2 g by mouth daily. Doesn't take regularly    . metoprolol (LOPRESSOR) 50 MG tablet take 1 tablet by mouth twice a day 60 tablet 6  . pravastatin (PRAVACHOL) 80 MG tablet take 1 tablet by mouth once daily 30 tablet 0   No current facility-administered medications for this visit.    Past Medical History  Diagnosis Date  . Arteriosclerotic cardiovascular disease (ASCVD)     with angina: 09/2009-100% proximal RCA; 50% proximal&60% mid LAD; 30-50% mid-Cx, normal EF  . Hyperlipidemia     Total cholesterol-136, triglycerides of 89, HDL 48 and LDL of 70 in 03/2010  . Hypertension     Left ventricular hypertrophy  . Gastroesophageal reflux disease   . Anxiety disorder   . Arthritis     Lyme disease in 10/2009  treated with antibiotics  . Chronic headaches   . Tobacco abuse, in remission     20 pack years; mild airway obstruction on PFTs in 2010  . Anxiety     Past Surgical History  Procedure Laterality Date  . Umbilical hernia repair  2003  . Appendectomy  1970  . Tonsillectomy  1962  . Nasal sinus surgery    . Knee arthroscopy w/ meniscal repair  2006, 2012  . Colonoscopy  2008  . Inguinal hernia repair      Social History   Social History  . Marital Status: Married    Spouse Name: N/A  . Number of Children: 1  . Years of Education: N/A   Occupational History  . Maintenance technician    Social History Main Topics  . Smoking status: Former Smoker -- 1.00 packs/day for 20 years    Types: Cigarettes    Start date: 06/29/1971    Quit date: 06/04/1988  . Smokeless tobacco: Never Used  . Alcohol Use: 0.5 oz/week    1 drink(s) per week  . Drug Use: Not on file  . Sexual Activity: Not on file   Other Topics Concern  . Not on file   Social History Narrative     Filed Vitals:   04/07/15 1533  BP: 138/84  Pulse: 68  Height: 6\' 2"  (1.88 m)  Weight: 216 lb (97.977 kg)  SpO2:  96%    PHYSICAL EXAM General: NAD HEENT: Normal. Neck: No JVD, no thyromegaly. Lungs: Clear to auscultation bilaterally with normal respiratory effort. CV: Nondisplaced PMI.  Regular rate and rhythm, normal S1/S2, no S3/S4, no murmur. No pretibial or periankle edema.  No carotid bruit.  Normal pedal pulses.  Abdomen: Soft, nontender, no hepatosplenomegaly, no distention.  Neurologic: Alert and oriented x 3.  Psych: Normal affect. Skin: Normal. Musculoskeletal: Normal range of motion, no gross deformities. Extremities: No clubbing or cyanosis.   ECG: Most recent ECG reviewed.      ASSESSMENT AND PLAN: 1. CAD: Stable ischemic heart disease. Continue ASA (reduce to 81 mg), metoprolol and statin.  2. Essential HTN: Controlled on present therapy. No changes.  3. Hyperlipidemia: Will  obtain copy of lipids. On pravastatin 80 mg.  Dispo: f/u 1 year.  Kate Sable, M.D., F.A.C.C.

## 2015-04-13 ENCOUNTER — Telehealth: Payer: Self-pay

## 2015-04-13 NOTE — Telephone Encounter (Signed)
Medication Detail      Disp Refills Start End     pravastatin (PRAVACHOL) 80 MG tablet 30 tablet 0 03/03/2015     Sig: take 1 tablet by mouth once daily    Notes to Pharmacy: Pt needs appt for any further refills    E-Prescribing Status: Receipt confirmed by pharmacy (03/03/2015 10:03 AM EDT)     Pharmacy    [facility], [place]

## 2015-04-14 ENCOUNTER — Other Ambulatory Visit: Payer: Self-pay

## 2015-04-14 MED ORDER — PRAVASTATIN SODIUM 80 MG PO TABS: ORAL_TABLET | ORAL | Status: DC

## 2015-05-09 ENCOUNTER — Other Ambulatory Visit: Payer: Self-pay

## 2015-05-09 DIAGNOSIS — I2510 Atherosclerotic heart disease of native coronary artery without angina pectoris: Secondary | ICD-10-CM

## 2015-05-09 DIAGNOSIS — E785 Hyperlipidemia, unspecified: Secondary | ICD-10-CM

## 2015-05-09 DIAGNOSIS — I10 Essential (primary) hypertension: Secondary | ICD-10-CM

## 2015-05-09 MED ORDER — METOPROLOL TARTRATE 50 MG PO TABS: ORAL_TABLET | ORAL | Status: DC

## 2015-05-09 NOTE — Telephone Encounter (Signed)
Refill complete 

## 2015-09-22 DIAGNOSIS — L02415 Cutaneous abscess of right lower limb: Secondary | ICD-10-CM | POA: Diagnosis not present

## 2015-09-27 DIAGNOSIS — T148 Other injury of unspecified body region: Secondary | ICD-10-CM | POA: Diagnosis not present

## 2015-10-05 DIAGNOSIS — M9905 Segmental and somatic dysfunction of pelvic region: Secondary | ICD-10-CM | POA: Diagnosis not present

## 2015-10-05 DIAGNOSIS — M5441 Lumbago with sciatica, right side: Secondary | ICD-10-CM | POA: Diagnosis not present

## 2015-10-05 DIAGNOSIS — M9903 Segmental and somatic dysfunction of lumbar region: Secondary | ICD-10-CM | POA: Diagnosis not present

## 2015-10-05 DIAGNOSIS — M9902 Segmental and somatic dysfunction of thoracic region: Secondary | ICD-10-CM | POA: Diagnosis not present

## 2015-10-20 DIAGNOSIS — I10 Essential (primary) hypertension: Secondary | ICD-10-CM | POA: Diagnosis not present

## 2015-10-20 DIAGNOSIS — M5431 Sciatica, right side: Secondary | ICD-10-CM | POA: Diagnosis not present

## 2015-10-20 DIAGNOSIS — I2510 Atherosclerotic heart disease of native coronary artery without angina pectoris: Secondary | ICD-10-CM | POA: Diagnosis not present

## 2015-10-21 ENCOUNTER — Other Ambulatory Visit (HOSPITAL_COMMUNITY): Payer: Self-pay | Admitting: Pulmonary Disease

## 2015-10-21 DIAGNOSIS — M5431 Sciatica, right side: Secondary | ICD-10-CM

## 2015-10-25 ENCOUNTER — Ambulatory Visit (HOSPITAL_COMMUNITY)
Admission: RE | Admit: 2015-10-25 | Discharge: 2015-10-25 | Disposition: A | Discharge status: Home / Self Care | Payer: BLUE CROSS/BLUE SHIELD | Source: Ambulatory Visit | Attending: Pulmonary Disease | Admitting: Pulmonary Disease

## 2015-10-25 ENCOUNTER — Other Ambulatory Visit (HOSPITAL_COMMUNITY): Payer: Self-pay | Admitting: Pulmonary Disease

## 2015-10-25 DIAGNOSIS — M5431 Sciatica, right side: Secondary | ICD-10-CM | POA: Insufficient documentation

## 2015-10-25 DIAGNOSIS — M4806 Spinal stenosis, lumbar region: Secondary | ICD-10-CM | POA: Insufficient documentation

## 2015-10-25 DIAGNOSIS — M5136 Other intervertebral disc degeneration, lumbar region: Secondary | ICD-10-CM | POA: Diagnosis not present

## 2015-11-10 DIAGNOSIS — M5416 Radiculopathy, lumbar region: Secondary | ICD-10-CM | POA: Diagnosis not present

## 2015-11-10 DIAGNOSIS — M549 Dorsalgia, unspecified: Secondary | ICD-10-CM | POA: Diagnosis not present

## 2015-11-10 DIAGNOSIS — Z6827 Body mass index (BMI) 27.0-27.9, adult: Secondary | ICD-10-CM | POA: Diagnosis not present

## 2015-11-10 DIAGNOSIS — I10 Essential (primary) hypertension: Secondary | ICD-10-CM | POA: Diagnosis not present

## 2015-11-11 ENCOUNTER — Other Ambulatory Visit: Payer: Self-pay | Admitting: Neurosurgery

## 2015-11-11 DIAGNOSIS — M5416 Radiculopathy, lumbar region: Secondary | ICD-10-CM

## 2015-11-25 ENCOUNTER — Ambulatory Visit
Admission: RE | Admit: 2015-11-25 | Discharge: 2015-11-25 | Disposition: A | Discharge status: Home / Self Care | Payer: BLUE CROSS/BLUE SHIELD | Source: Ambulatory Visit | Attending: Neurosurgery | Admitting: Neurosurgery

## 2015-11-25 DIAGNOSIS — M4806 Spinal stenosis, lumbar region: Secondary | ICD-10-CM | POA: Diagnosis not present

## 2015-11-25 DIAGNOSIS — M5416 Radiculopathy, lumbar region: Secondary | ICD-10-CM

## 2015-11-25 MED ORDER — DIAZEPAM 5 MG PO TABS
10.0000 mg | ORAL_TABLET | Freq: Once | ORAL | Status: AC
  Administered 2015-11-25: 10 mg via ORAL

## 2015-11-25 MED ORDER — IOPAMIDOL (ISOVUE-M 200) INJECTION 41%
15.0000 mL | Freq: Once | INTRAMUSCULAR | Status: AC
  Administered 2015-11-25: 15 mL via INTRATHECAL

## 2015-11-25 MED ORDER — DIAZEPAM 5 MG PO TABS: 10.0000 mg | ORAL_TABLET | Freq: Once | ORAL | Status: DC

## 2015-11-25 NOTE — Discharge Instructions (Signed)

## 2015-12-04 ENCOUNTER — Other Ambulatory Visit: Payer: Self-pay | Admitting: Cardiovascular Disease

## 2015-12-16 DIAGNOSIS — Z6827 Body mass index (BMI) 27.0-27.9, adult: Secondary | ICD-10-CM | POA: Diagnosis not present

## 2015-12-16 DIAGNOSIS — M5416 Radiculopathy, lumbar region: Secondary | ICD-10-CM | POA: Diagnosis not present

## 2015-12-16 DIAGNOSIS — I10 Essential (primary) hypertension: Secondary | ICD-10-CM | POA: Diagnosis not present

## 2015-12-22 ENCOUNTER — Other Ambulatory Visit: Payer: Self-pay | Admitting: Neurosurgery

## 2015-12-22 DIAGNOSIS — M5416 Radiculopathy, lumbar region: Secondary | ICD-10-CM

## 2015-12-26 ENCOUNTER — Ambulatory Visit
Admission: RE | Admit: 2015-12-26 | Discharge: 2015-12-26 | Disposition: A | Discharge status: Home / Self Care | Payer: BLUE CROSS/BLUE SHIELD | Source: Ambulatory Visit | Attending: Neurosurgery | Admitting: Neurosurgery

## 2015-12-26 ENCOUNTER — Other Ambulatory Visit: Payer: Self-pay | Admitting: Neurosurgery

## 2015-12-26 DIAGNOSIS — M5126 Other intervertebral disc displacement, lumbar region: Secondary | ICD-10-CM | POA: Diagnosis not present

## 2015-12-26 DIAGNOSIS — M5416 Radiculopathy, lumbar region: Secondary | ICD-10-CM

## 2015-12-26 MED ORDER — METHYLPREDNISOLONE ACETATE 40 MG/ML INJ SUSP (RADIOLOG: 120.0000 mg | Freq: Once | INTRAMUSCULAR | Status: DC

## 2015-12-26 MED ORDER — IOPAMIDOL (ISOVUE-M 200) INJECTION 41%: 1.0000 mL | Freq: Once | INTRAMUSCULAR | Status: DC

## 2015-12-26 NOTE — Discharge Instructions (Signed)

## 2016-02-07 ENCOUNTER — Other Ambulatory Visit: Payer: Self-pay | Admitting: Neurosurgery

## 2016-02-07 DIAGNOSIS — M5416 Radiculopathy, lumbar region: Secondary | ICD-10-CM

## 2016-02-14 ENCOUNTER — Ambulatory Visit
Admission: RE | Admit: 2016-02-14 | Discharge: 2016-02-14 | Disposition: A | Discharge status: Home / Self Care | Payer: BLUE CROSS/BLUE SHIELD | Source: Ambulatory Visit | Attending: Neurosurgery | Admitting: Neurosurgery

## 2016-02-14 DIAGNOSIS — M5416 Radiculopathy, lumbar region: Secondary | ICD-10-CM

## 2016-02-14 DIAGNOSIS — M545 Low back pain: Secondary | ICD-10-CM | POA: Diagnosis not present

## 2016-02-14 MED ORDER — IOPAMIDOL (ISOVUE-M 200) INJECTION 41%
1.0000 mL | Freq: Once | INTRAMUSCULAR | Status: AC
  Administered 2016-02-14: 1 mL via EPIDURAL

## 2016-02-14 MED ORDER — METHYLPREDNISOLONE ACETATE 40 MG/ML INJ SUSP (RADIOLOG
120.0000 mg | Freq: Once | INTRAMUSCULAR | Status: AC
  Administered 2016-02-14: 120 mg via EPIDURAL

## 2016-03-22 DIAGNOSIS — I10 Essential (primary) hypertension: Secondary | ICD-10-CM | POA: Diagnosis not present

## 2016-03-22 DIAGNOSIS — Z6827 Body mass index (BMI) 27.0-27.9, adult: Secondary | ICD-10-CM | POA: Diagnosis not present

## 2016-03-22 DIAGNOSIS — M5416 Radiculopathy, lumbar region: Secondary | ICD-10-CM | POA: Diagnosis not present

## 2016-03-31 DIAGNOSIS — M159 Polyosteoarthritis, unspecified: Secondary | ICD-10-CM | POA: Diagnosis not present

## 2016-03-31 DIAGNOSIS — E785 Hyperlipidemia, unspecified: Secondary | ICD-10-CM | POA: Diagnosis not present

## 2016-03-31 DIAGNOSIS — I10 Essential (primary) hypertension: Secondary | ICD-10-CM | POA: Diagnosis not present

## 2016-03-31 DIAGNOSIS — I2510 Atherosclerotic heart disease of native coronary artery without angina pectoris: Secondary | ICD-10-CM | POA: Diagnosis not present

## 2016-03-31 DIAGNOSIS — R739 Hyperglycemia, unspecified: Secondary | ICD-10-CM | POA: Diagnosis not present

## 2016-04-05 ENCOUNTER — Other Ambulatory Visit: Payer: Self-pay | Admitting: Cardiovascular Disease

## 2016-04-05 DIAGNOSIS — M5136 Other intervertebral disc degeneration, lumbar region: Secondary | ICD-10-CM | POA: Insufficient documentation

## 2016-04-05 DIAGNOSIS — M533 Sacrococcygeal disorders, not elsewhere classified: Secondary | ICD-10-CM | POA: Diagnosis not present

## 2016-04-16 DIAGNOSIS — I10 Essential (primary) hypertension: Secondary | ICD-10-CM | POA: Diagnosis not present

## 2016-04-16 DIAGNOSIS — M159 Polyosteoarthritis, unspecified: Secondary | ICD-10-CM | POA: Diagnosis not present

## 2016-04-16 DIAGNOSIS — Z23 Encounter for immunization: Secondary | ICD-10-CM | POA: Diagnosis not present

## 2016-04-16 DIAGNOSIS — E785 Hyperlipidemia, unspecified: Secondary | ICD-10-CM | POA: Diagnosis not present

## 2016-04-16 DIAGNOSIS — Z0000 Encounter for general adult medical examination without abnormal findings: Secondary | ICD-10-CM | POA: Diagnosis not present

## 2016-04-16 DIAGNOSIS — I2510 Atherosclerotic heart disease of native coronary artery without angina pectoris: Secondary | ICD-10-CM | POA: Diagnosis not present

## 2016-04-30 ENCOUNTER — Ambulatory Visit (INDEPENDENT_AMBULATORY_CARE_PROVIDER_SITE_OTHER): Payer: BLUE CROSS/BLUE SHIELD | Admitting: Orthopedic Surgery

## 2016-04-30 ENCOUNTER — Ambulatory Visit (INDEPENDENT_AMBULATORY_CARE_PROVIDER_SITE_OTHER): Payer: BLUE CROSS/BLUE SHIELD

## 2016-04-30 ENCOUNTER — Ambulatory Visit: Payer: BLUE CROSS/BLUE SHIELD | Admitting: Orthopedic Surgery

## 2016-04-30 VITALS — BP 150/75 | HR 55 | Ht 74.0 in | Wt 215.0 lb

## 2016-04-30 DIAGNOSIS — M25551 Pain in right hip: Secondary | ICD-10-CM

## 2016-04-30 DIAGNOSIS — G8929 Other chronic pain: Secondary | ICD-10-CM

## 2016-04-30 DIAGNOSIS — M533 Sacrococcygeal disorders, not elsewhere classified: Secondary | ICD-10-CM | POA: Diagnosis not present

## 2016-04-30 LAB — FECAL OCCULT BLOOD, GUAIAC: Fecal Occult Blood: NEGATIVE

## 2016-04-30 NOTE — Progress Notes (Signed)
Patient ID: Timothy Shaw, male   DOB: 26-Oct-1955, 60 y.o.   MRN: [id number]  Chief Complaint  Patient presents with  . Leg Pain    Right leg pain, referred from Dr. Luan Pulling.    HPI Timothy Shaw is a 60 y.o. male.  Presents after evaluation by neurosurgery and a consultation at [facility] for degenerative disc disease with left-sided symptoms but has pain over the right SI joint. His had an adequate course of anti-inflammatory medication he's had injections he did not improve so he was sent here for another opinion regarding his condition  He basically has right SI joint pain right hip and leg pain no groin pain although his MRI and myelogram suggests left-sided radicular symptoms should be present they are not  Review of Systems Review of Systems  Respiratory: Negative.   Cardiovascular: Negative.   Gastrointestinal: Negative.   Neurological: Positive for numbness. Negative for weakness.    Past Medical History:  Diagnosis Date  . Anxiety   . Anxiety disorder   . Arteriosclerotic cardiovascular disease (ASCVD)    with angina: 09/2009-100% proximal RCA; 50% proximal&60% mid LAD; 30-50% mid-Cx, normal EF  . Arthritis    Lyme disease in 10/2009 treated with antibiotics  . Chronic headaches   . Gastroesophageal reflux disease   . Hyperlipidemia    Total cholesterol-136, triglycerides of 89, HDL 48 and LDL of 70 in 03/2010  . Hypertension    Left ventricular hypertrophy  . Tobacco abuse, in remission    20 pack years; mild airway obstruction on PFTs in 2010    Past Surgical History:  Procedure Laterality Date  . APPENDECTOMY  1970  . COLONOSCOPY  2008  . inguinal hernia repair    . KNEE ARTHROSCOPY W/ MENISCAL REPAIR  2006, 2012  . NASAL SINUS SURGERY    . TONSILLECTOMY  1962  . UMBILICAL HERNIA REPAIR  2003    Social History Social History  Substance Use Topics  . Smoking status: Former Smoker    Packs/day: 1.00    Years: 20.00    Types: Cigarettes    Start date:  06/29/1971    Quit date: 06/04/1988  . Smokeless tobacco: Never Used  . Alcohol use 0.5 oz/week    1 drink(s) per week    No Known Allergies  No outpatient prescriptions have been marked as taking for the 04/30/16 encounter (Office Visit) with Carole Civil, MD.      Physical Exam Physical Exam BP (!) 150/75   Pulse (!) 55   Ht 6\' 2"  (1.88 m)   Wt 215 lb (97.5 kg)   BMI 27.60 kg/m   Gen. appearance. The patient is well-developed and well-nourished, grooming and hygiene are normal. There are no gross congenital abnormalities  The patient is alert and oriented to person place and time  Mood and affect are normal  Ambulation Remains intact and normal  Examination reveals the following: On inspection we find tenderness over the right SI joint  With the range of motion of  right hip normal  Stability tests were normal  right hip normal  Corky Sox test was positive  Strength tests revealed grade 5 motor strength  Skin we find no rash ulceration or erythema  Sensation remains intact  Impression vascular system shows no peripheral edema  Data Reviewed CT scan and myelogram show degenerative disc disease with various levels of degeneration  His right hip x-ray taken in the office today was normal  Assessment    Encounter  Diagnoses  Name Primary?  . Pain of right hip joint   . Chronic right SI joint pain Yes   He does not have any hip arthritis is disc disease is asymptomatic as far as the imaging goes. However he does have SI joint dysfunction and I will refer him to appropriate specialist    Plan    referral       Arther Abbott 04/30/2016, 10:13 AM

## 2016-04-30 NOTE — Addendum Note (Signed)
Addended by: Baldomero Lamy B on: 04/30/2016 02:14 PM   Modules accepted: Orders

## 2016-04-30 NOTE — Patient Instructions (Addendum)
ReferralSacroiliac Joint Dysfunction Introduction Sacroiliac joint dysfunction is a condition that causes inflammation on one or both sides of the sacroiliac (SI) joint. The SI joint connects the lower part of the spine (sacrum) with the two upper portions of the pelvis (ilium). This condition causes deep aching or burning pain in the low back. In some cases, the pain may also spread into one or both buttocks or hips or spread down the legs. What are the causes? This condition may be caused by:  Pregnancy. During pregnancy, extra stress is put on the SI joints because the pelvis widens.  Injury, such as:  Car accidents.  Sport-related injuries.  Work-related injuries.  Having one leg that is shorter than the other.  Conditions that affect the joints, such as:  Rheumatoid arthritis.  Gout.  Psoriatic arthritis.  Joint infection (septic arthritis). Sometimes, the cause of SI joint dysfunction is not known. What are the signs or symptoms? Symptoms of this condition include:  Aching or burning pain in the lower back. The pain may also spread to other areas, such as:  Buttocks.  Groin.  Thighs and legs.  Muscle spasms in or around the painful areas.  Increased pain when standing, walking, running, stair climbing, bending, or lifting. How is this diagnosed? Your health care provider will do a physical exam and take your medical history. During the exam, the health care provider may move one or both of your legs to different positions to check for pain. Various tests may be done to help verify the diagnosis, including:  Imaging tests to look for other causes of pain. These may include:  MRI.  CT scan.  Bone scan.  Diagnostic injection. A numbing medicine is injected into the SI joint using a needle. If the pain is temporarily improved or stopped after the injection, this can indicate that SI joint dysfunction is the problem. How is this treated? Treatment may vary  depending on the cause and severity of your condition. Treatment options may include:  Applying ice or heat to the lower back area. This can help to reduce pain and muscle spasms.  Medicines to relieve pain or inflammation or to relax the muscles.  Wearing a back brace (sacroiliac brace) to help support the joint while your back is healing.  Physical therapy to increase muscle strength around the joint and flexibility at the joint. This may also involve learning proper body positions and ways of moving to relieve stress on the joint.  Direct manipulation of the SI joint.  Injections of steroid medicine into the joint in order to reduce pain and swelling.  Radiofrequency ablation to burn away nerves that are carrying pain messages from the joint.  Use of a device that provides electrical stimulation in order to reduce pain at the joint.  Surgery to put in screws and plates that limit or prevent joint motion. This is rare. Follow these instructions at home:  Rest as needed. Limit your activities as directed by your health care provider.  Take medicines only as directed by your health care provider.  If directed, apply ice to the affected area:  Put ice in a plastic bag.  Place a towel between your skin and the bag.  Leave the ice on for 20 minutes, 2-3 times per day.  Use a heating pad or a moist heat pack as directed by your health care provider.  Exercise as directed by your health care provider or physical therapist.  Keep all follow-up visits as directed by  your health care provider. This is important. Contact a health care provider if:  Your pain is not controlled with medicine.  You have a fever.  You have increasingly severe pain. Get help right away if:  You have weakness, numbness, or tingling in your legs or feet.  You lose control of your bladder or bowel. This information is not intended to replace advice given to you by your health care provider. Make sure  you discuss any questions you have with your health care provider. Document Released: 08/17/2008 Document Revised: 10/27/2015 Document Reviewed: 01/26/2014  2017 [employer]

## 2016-05-02 DIAGNOSIS — Z1211 Encounter for screening for malignant neoplasm of colon: Secondary | ICD-10-CM | POA: Diagnosis not present

## 2016-05-03 ENCOUNTER — Other Ambulatory Visit: Payer: Self-pay | Admitting: Cardiovascular Disease

## 2016-05-03 DIAGNOSIS — I2510 Atherosclerotic heart disease of native coronary artery without angina pectoris: Secondary | ICD-10-CM

## 2016-05-03 DIAGNOSIS — I10 Essential (primary) hypertension: Secondary | ICD-10-CM

## 2016-05-03 DIAGNOSIS — E785 Hyperlipidemia, unspecified: Secondary | ICD-10-CM

## 2016-05-07 DIAGNOSIS — M545 Low back pain: Secondary | ICD-10-CM | POA: Diagnosis not present

## 2016-05-10 ENCOUNTER — Other Ambulatory Visit: Payer: Self-pay | Admitting: Orthopedic Surgery

## 2016-05-10 DIAGNOSIS — M545 Low back pain: Principal | ICD-10-CM

## 2016-05-10 DIAGNOSIS — G8929 Other chronic pain: Secondary | ICD-10-CM

## 2016-05-14 ENCOUNTER — Ambulatory Visit (INDEPENDENT_AMBULATORY_CARE_PROVIDER_SITE_OTHER): Payer: BLUE CROSS/BLUE SHIELD | Admitting: Cardiovascular Disease

## 2016-05-14 ENCOUNTER — Encounter: Payer: Self-pay | Admitting: Cardiovascular Disease

## 2016-05-14 VITALS — BP 120/70 | HR 56 | Ht 74.0 in | Wt 213.0 lb

## 2016-05-14 DIAGNOSIS — I10 Essential (primary) hypertension: Secondary | ICD-10-CM

## 2016-05-14 DIAGNOSIS — I25118 Atherosclerotic heart disease of native coronary artery with other forms of angina pectoris: Secondary | ICD-10-CM | POA: Diagnosis not present

## 2016-05-14 DIAGNOSIS — E7800 Pure hypercholesterolemia, unspecified: Secondary | ICD-10-CM

## 2016-05-14 DIAGNOSIS — R002 Palpitations: Secondary | ICD-10-CM

## 2016-05-14 NOTE — Patient Instructions (Signed)
Your physician wants you to follow-up in: 1 year Dr Koneswaran You will receive a reminder letter in the mail two months in advance. If you don't receive a letter, please call our office to schedule the follow-up appointment.     Your physician recommends that you continue on your current medications as directed. Please refer to the Current Medication list given to you today.    If you need a refill on your cardiac medications before your next appointment, please call your pharmacy.      Thank you for choosing [facility] !         

## 2016-05-14 NOTE — Progress Notes (Signed)
SUBJECTIVE: The patient presents for followup of coronary artery disease, hyperlipidemia and hypertension. He has been doing well, and denies shortness of breath, orthopnea, and leg swelling. Has some mild exertional chest pressure after eating certain foods but this is not consistent. He also has occasional palpitations when his back pain flares up. He works as a Therapist, occupational at [employer] in [employer].   Review of Systems: As per "subjective", otherwise negative.  No Known Allergies  Current Outpatient Prescriptions  Medication Sig Dispense Refill  . amLODipine (NORVASC) 5 MG tablet take 1 tablet by mouth once daily 30 tablet 6  . aspirin EC 81 MG tablet Take 1 tablet (81 mg total) by mouth daily. 90 tablet 3  . calcium carbonate (TUMS - DOSED IN MG ELEMENTAL CALCIUM) 500 MG chewable tablet Chew 1 tablet by mouth as needed.     Marland Kitchen esomeprazole (NEXIUM) 40 MG capsule Take 22 mg by mouth daily before breakfast. Over the counter    . fish oil-omega-3 fatty acids 1000 MG capsule Take 2 g by mouth daily. Doesn't take regularly    . meloxicam (MOBIC) 15 MG tablet Take 15 mg by mouth daily.     . metoprolol (LOPRESSOR) 50 MG tablet take 1 tablet by mouth twice a day 60 tablet 0  . pravastatin (PRAVACHOL) 80 MG tablet take 1 tablet by mouth once daily 30 tablet 11   No current facility-administered medications for this visit.     Past Medical History:  Diagnosis Date  . Anxiety   . Anxiety disorder   . Arteriosclerotic cardiovascular disease (ASCVD)    with angina: 09/2009-100% proximal RCA; 50% proximal&60% mid LAD; 30-50% mid-Cx, normal EF  . Arthritis    Lyme disease in 10/2009 treated with antibiotics  . Chronic headaches   . Gastroesophageal reflux disease   . Hyperlipidemia    Total cholesterol-136, triglycerides of 89, HDL 48 and LDL of 70 in 03/2010  . Hypertension    Left ventricular hypertrophy  . Tobacco abuse, in remission    20 pack years; mild airway obstruction  on PFTs in 2010    Past Surgical History:  Procedure Laterality Date  . APPENDECTOMY  1970  . COLONOSCOPY  2008  . inguinal hernia repair    . KNEE ARTHROSCOPY W/ MENISCAL REPAIR  2006, 2012  . NASAL SINUS SURGERY    . TONSILLECTOMY  1962  . UMBILICAL HERNIA REPAIR  2003    Social History   Social History  . Marital status: Married    Spouse name: N/A  . Number of children: 1  . Years of education: N/A   Occupational History  . Maintenance technician    Social History Main Topics  . Smoking status: Former Smoker    Packs/day: 1.00    Years: 20.00    Types: Cigarettes    Start date: 06/29/1971    Quit date: 06/04/1988  . Smokeless tobacco: Never Used  . Alcohol use 0.5 oz/week    1 drink(s) per week  . Drug use: Unknown  . Sexual activity: Not on file   Other Topics Concern  . Not on file   Social History Narrative  . No narrative on file     Vitals:   05/14/16 0841  BP: 120/70  Pulse: (!) 56  SpO2: 96%  Weight: 213 lb (96.6 kg)  Height: 6\' 2"  (1.88 m)    PHYSICAL EXAM General: NAD HEENT: Normal. Neck: No JVD, no thyromegaly. Lungs: Clear to  auscultation bilaterally with normal respiratory effort. CV: Nondisplaced PMI.  Regular rate and rhythm, normal S1/S2, no S3/S4, no murmur. No pretibial or periankle edema.  No carotid bruit.   Abdomen: Soft, nontender, no distention.  Neurologic: Alert and oriented.  Psych: Normal affect. Skin: Normal. Musculoskeletal: No gross deformities.    ECG: Most recent ECG reviewed.      ASSESSMENT AND PLAN: 1. CAD: Stable ischemic heart disease. Continue ASA, metoprolol, and statin.  2. Essential HTN: Controlled on present therapy. No changes.  3. Hyperlipidemia: Lipids 03/31/16 total cholesterol 139, triglycerides 60, HDL 47, LDL 80. Continue pravastatin 80 mg.  4. Palpitations: Seldom and self-limiting. Continue metoprolol at present dose.  Dispo: f/u 1 year.   Kate Sable, M.D., F.A.C.C.

## 2016-05-17 ENCOUNTER — Other Ambulatory Visit (HOSPITAL_BASED_OUTPATIENT_CLINIC_OR_DEPARTMENT_OTHER): Payer: Self-pay

## 2016-05-17 DIAGNOSIS — G4730 Sleep apnea, unspecified: Secondary | ICD-10-CM

## 2016-05-23 ENCOUNTER — Ambulatory Visit: Payer: BLUE CROSS/BLUE SHIELD | Attending: Pulmonary Disease | Admitting: Neurology

## 2016-05-23 DIAGNOSIS — G4733 Obstructive sleep apnea (adult) (pediatric): Secondary | ICD-10-CM | POA: Diagnosis not present

## 2016-05-23 DIAGNOSIS — G4730 Sleep apnea, unspecified: Secondary | ICD-10-CM

## 2016-05-24 ENCOUNTER — Ambulatory Visit
Admission: RE | Admit: 2016-05-24 | Discharge: 2016-05-24 | Disposition: A | Discharge status: Home / Self Care | Payer: BLUE CROSS/BLUE SHIELD | Source: Ambulatory Visit | Attending: Orthopedic Surgery | Admitting: Orthopedic Surgery

## 2016-05-24 DIAGNOSIS — M533 Sacrococcygeal disorders, not elsewhere classified: Secondary | ICD-10-CM | POA: Diagnosis not present

## 2016-05-24 DIAGNOSIS — M545 Low back pain: Principal | ICD-10-CM

## 2016-05-24 DIAGNOSIS — G8929 Other chronic pain: Secondary | ICD-10-CM

## 2016-05-29 NOTE — Procedures (Signed)
  Gilman A. Merlene Laughter, MD     [email or web address]             NOCTURNAL POLYSOMNOGRAPHY   LOCATION: [facility]   Patient Name: Timothy Shaw, Timothy Shaw Date: 05/23/2016 Gender: Male D.O.B: Mar 19, 1956 Age (years): 41 Referring Provider: Lennox Solders Height (inches): 74 Interpreting Physician: Phillips Odor MD, ABSM Weight (lbs): 215 RPSGT: Peak, Robert BMI: 28 MRN: [id number] Neck Size: 17.00 CLINICAL INFORMATION Sleep Study Type: NPSG  Indication for sleep study: N/A  Epworth Sleepiness Score: 11  SLEEP STUDY TECHNIQUE As per the AASM Manual for the Scoring of Sleep and Associated Events v2.3 (April 2016) with a hypopnea requiring 4% desaturations.  The channels recorded and monitored were frontal, central and occipital EEG, electrooculogram (EOG), submentalis EMG (chin), nasal and oral airflow, thoracic and abdominal wall motion, anterior tibialis EMG, snore microphone, electrocardiogram, and pulse oximetry.  MEDICATIONS Medications self-administered by patient taken the night of the study : N/A  Current Outpatient Prescriptions:  .  amLODipine (NORVASC) 5 MG tablet, take 1 tablet by mouth once daily, Disp: 30 tablet, Rfl: 6 .  aspirin EC 81 MG tablet, Take 1 tablet (81 mg total) by mouth daily., Disp: 90 tablet, Rfl: 3 .  calcium carbonate (TUMS - DOSED IN MG ELEMENTAL CALCIUM) 500 MG chewable tablet, Chew 1 tablet by mouth as needed. , Disp: , Rfl:  .  esomeprazole (NEXIUM) 40 MG capsule, Take 22 mg by mouth daily before breakfast. Over the counter, Disp: , Rfl:  .  fish oil-omega-3 fatty acids 1000 MG capsule, Take 2 g by mouth daily. Doesn't take regularly, Disp: , Rfl:  .  meloxicam (MOBIC) 15 MG tablet, Take 15 mg by mouth daily. , Disp: , Rfl:  .  metoprolol (LOPRESSOR) 50 MG tablet, take 1 tablet by mouth twice a day, Disp: 60 tablet, Rfl: 0 .  pravastatin (PRAVACHOL) 80 MG tablet, take 1 tablet by mouth once daily, Disp: 30 tablet, Rfl:  11   SLEEP ARCHITECTURE The study was initiated at 10:01:47 PM and ended at 4:11:08 AM.  Sleep onset time was 7.4 minutes and the sleep efficiency was 68.2%. The total sleep time was 252.0 minutes.  Stage REM latency was 108.5 minutes.  The patient spent 14.68% of the night in stage N1 sleep, 64.88% in stage N2 sleep, 0.00% in stage N3 and 20.44% in REM.  Alpha intrusion was absent.  Supine sleep was 0.20%.  RESPIRATORY PARAMETERS The overall apnea/hypopnea index (AHI) was 6.0 per hour. There were 4 total apneas, including 0 obstructive, 4 central and 0 mixed apneas. There were 21 hypopneas and 112 RERAs.  The AHI during Stage REM sleep was 5.8 per hour.  AHI while supine was 0.0 per hour.  The mean oxygen saturation was 94.62%. The minimum SpO2 during sleep was 91.00%.  Loud snoring was noted during this study.  CARDIAC DATA The 2 lead EKG demonstrated sinus rhythm. The mean heart rate was 54.62 beats per minute. Other EKG findings include: PVCs. LEG MOVEMENT DATA The total PLMS were 0 with a resulting PLMS index of 0.00. Associated arousal with leg movement index was 0.0.  IMPRESSIONS - Mild obstructive sleep apnea is noted but positive pressure treatment is not required. - Absent slow wave sleep is observed.    Delano Metz, MD Diplomate, American Board of Sleep Medicine.

## 2016-06-01 DIAGNOSIS — M533 Sacrococcygeal disorders, not elsewhere classified: Secondary | ICD-10-CM | POA: Diagnosis not present

## 2016-06-18 ENCOUNTER — Other Ambulatory Visit: Payer: Self-pay | Admitting: Cardiovascular Disease

## 2016-06-18 DIAGNOSIS — E785 Hyperlipidemia, unspecified: Secondary | ICD-10-CM

## 2016-06-18 DIAGNOSIS — I10 Essential (primary) hypertension: Secondary | ICD-10-CM

## 2016-06-18 DIAGNOSIS — M47816 Spondylosis without myelopathy or radiculopathy, lumbar region: Secondary | ICD-10-CM | POA: Diagnosis not present

## 2016-06-18 DIAGNOSIS — I2510 Atherosclerotic heart disease of native coronary artery without angina pectoris: Secondary | ICD-10-CM

## 2016-06-26 DIAGNOSIS — M533 Sacrococcygeal disorders, not elsewhere classified: Secondary | ICD-10-CM | POA: Diagnosis not present

## 2016-07-04 ENCOUNTER — Other Ambulatory Visit: Payer: Self-pay | Admitting: Cardiovascular Disease

## 2016-07-16 DIAGNOSIS — M47816 Spondylosis without myelopathy or radiculopathy, lumbar region: Secondary | ICD-10-CM | POA: Diagnosis not present

## 2016-07-20 ENCOUNTER — Other Ambulatory Visit: Payer: Self-pay | Admitting: Cardiovascular Disease

## 2016-07-20 DIAGNOSIS — E785 Hyperlipidemia, unspecified: Secondary | ICD-10-CM

## 2016-07-20 DIAGNOSIS — I2510 Atherosclerotic heart disease of native coronary artery without angina pectoris: Secondary | ICD-10-CM

## 2016-07-20 DIAGNOSIS — I10 Essential (primary) hypertension: Secondary | ICD-10-CM

## 2016-07-23 ENCOUNTER — Other Ambulatory Visit: Payer: Self-pay

## 2016-07-23 ENCOUNTER — Telehealth: Payer: Self-pay | Admitting: Cardiovascular Disease

## 2016-07-23 DIAGNOSIS — I2510 Atherosclerotic heart disease of native coronary artery without angina pectoris: Secondary | ICD-10-CM

## 2016-07-23 DIAGNOSIS — E785 Hyperlipidemia, unspecified: Secondary | ICD-10-CM

## 2016-07-23 DIAGNOSIS — I10 Essential (primary) hypertension: Secondary | ICD-10-CM

## 2016-07-23 MED ORDER — METOPROLOL TARTRATE 50 MG PO TABS: 50.0000 mg | ORAL_TABLET | Freq: Two times a day (BID) | ORAL | 3 refills | Status: DC

## 2016-07-23 NOTE — Telephone Encounter (Signed)
escribed refill lopressor per pharmacy fax

## 2016-07-23 NOTE — Telephone Encounter (Signed)
°*  STAT* If patient is at the pharmacy, call can be transferred to refill team.   1. Which medications need to be refilled? (please list name of each medication and dose if known)  metoprolol (LOPRESSOR) 50 MG tablet [id number]   2. Which pharmacy/location (including street and city if local pharmacy) is medication to be sent to? [place]  3. Do they need a 30 day or 90 day supply?

## 2016-07-23 NOTE — Progress Notes (Unsigned)
rx refilled.

## 2016-07-23 NOTE — Telephone Encounter (Signed)
Rx refilled.

## 2016-07-31 DIAGNOSIS — M5416 Radiculopathy, lumbar region: Secondary | ICD-10-CM | POA: Diagnosis not present

## 2016-08-20 DIAGNOSIS — M5416 Radiculopathy, lumbar region: Secondary | ICD-10-CM | POA: Diagnosis not present

## 2016-08-30 ENCOUNTER — Encounter: Payer: Self-pay | Admitting: Gastroenterology

## 2016-10-01 DIAGNOSIS — M5416 Radiculopathy, lumbar region: Secondary | ICD-10-CM | POA: Diagnosis not present

## 2016-10-08 DIAGNOSIS — M5416 Radiculopathy, lumbar region: Secondary | ICD-10-CM | POA: Diagnosis not present

## 2016-10-23 DIAGNOSIS — M5416 Radiculopathy, lumbar region: Secondary | ICD-10-CM | POA: Diagnosis not present

## 2016-11-14 DIAGNOSIS — M5416 Radiculopathy, lumbar region: Secondary | ICD-10-CM | POA: Diagnosis not present

## 2016-12-12 ENCOUNTER — Encounter: Payer: Self-pay | Admitting: Orthopaedic Surgery

## 2016-12-12 ENCOUNTER — Ambulatory Visit: Payer: BLUE CROSS/BLUE SHIELD

## 2016-12-12 ENCOUNTER — Ambulatory Visit (INDEPENDENT_AMBULATORY_CARE_PROVIDER_SITE_OTHER): Payer: BLUE CROSS/BLUE SHIELD | Admitting: Orthopaedic Surgery

## 2016-12-12 VITALS — BP 125/72 | HR 54 | Temp 97.1°F | Ht 74.0 in | Wt 215.0 lb

## 2016-12-12 DIAGNOSIS — M25562 Pain in left knee: Secondary | ICD-10-CM | POA: Diagnosis not present

## 2016-12-12 DIAGNOSIS — G8929 Other chronic pain: Secondary | ICD-10-CM

## 2016-12-13 NOTE — Progress Notes (Signed)
CC:  I have pain of my left knee. I would like an injection.  The patient has chronic pain of the left knee.  There is no recent trauma.  There is no redness.  Injections in the past have helped.  The knee has no redness, has an effusion and crepitus present.  ROM of the left knee is 0-110.  Impression:  Chronic knee pain left  Return: after MRI of the knee.  PROCEDURE NOTE:  The patient requests injections of the left knee, verbal consent was obtained.  The left knee was prepped appropriately after time out was performed.   Sterile technique was observed and injection of 1 cc of Depo-Medrol 40 mg with several cc's of plain xylocaine. Anesthesia was provided by ethyl chloride and a 20-gauge needle was used to inject the knee area. The injection was tolerated well.  A band aid dressing was applied.  The patient was advised to apply ice later today and tomorrow to the injection sight as needed.  He has had giving way of the left knee, swelling, pain, no improvement with conservative therapy and positive medial McMurray.  I would like to get a MRI of the knee.  Return after MRI.  Electronically Signed Sanjuana Kava, MD 7/12/20188:26 AM

## 2016-12-15 ENCOUNTER — Other Ambulatory Visit: Payer: Self-pay | Admitting: Cardiovascular Disease

## 2016-12-15 DIAGNOSIS — I10 Essential (primary) hypertension: Secondary | ICD-10-CM

## 2016-12-15 DIAGNOSIS — I2510 Atherosclerotic heart disease of native coronary artery without angina pectoris: Secondary | ICD-10-CM

## 2016-12-18 ENCOUNTER — Ambulatory Visit (HOSPITAL_COMMUNITY)
Admission: RE | Admit: 2016-12-18 | Discharge: 2016-12-18 | Disposition: A | Discharge status: Home / Self Care | Payer: BLUE CROSS/BLUE SHIELD | Source: Ambulatory Visit | Attending: Orthopaedic Surgery | Admitting: Orthopaedic Surgery

## 2016-12-18 DIAGNOSIS — M25562 Pain in left knee: Secondary | ICD-10-CM

## 2016-12-18 DIAGNOSIS — M948X6 Other specified disorders of cartilage, lower leg: Secondary | ICD-10-CM | POA: Insufficient documentation

## 2016-12-18 DIAGNOSIS — G8929 Other chronic pain: Secondary | ICD-10-CM | POA: Insufficient documentation

## 2016-12-18 DIAGNOSIS — S83242A Other tear of medial meniscus, current injury, left knee, initial encounter: Secondary | ICD-10-CM | POA: Diagnosis not present

## 2016-12-18 DIAGNOSIS — R936 Abnormal findings on diagnostic imaging of limbs: Secondary | ICD-10-CM | POA: Insufficient documentation

## 2016-12-18 DIAGNOSIS — X58XXXA Exposure to other specified factors, initial encounter: Secondary | ICD-10-CM | POA: Diagnosis not present

## 2016-12-25 ENCOUNTER — Ambulatory Visit (INDEPENDENT_AMBULATORY_CARE_PROVIDER_SITE_OTHER): Payer: BLUE CROSS/BLUE SHIELD | Admitting: Orthopaedic Surgery

## 2016-12-25 ENCOUNTER — Encounter: Payer: Self-pay | Admitting: Orthopaedic Surgery

## 2016-12-25 VITALS — BP 115/69 | HR 61 | Temp 97.5°F | Ht 74.0 in | Wt 215.0 lb

## 2016-12-25 DIAGNOSIS — G8929 Other chronic pain: Secondary | ICD-10-CM

## 2016-12-25 DIAGNOSIS — M25562 Pain in left knee: Secondary | ICD-10-CM | POA: Diagnosis not present

## 2016-12-25 NOTE — Progress Notes (Signed)
Patient Timothy Shaw, male DOB:[birth year], 61 y.o. [id number]  Chief Complaint  Patient presents with  . Knee Pain    left for MRI results    HPI  LAZARUS SUDBURY is a 61 y.o. male who has had pain in the left knee with giving way and swelling.  He had MRI of the knee which showed: IMPRESSION: 1. Radial tear of the posterior horn of the medial meniscus at the meniscal root. Increased signal throughout the medial meniscus consistent with degeneration. 2. Increased signal of the posterior horn of the lateral meniscus consistent with degeneration. Possible small undersurface tear of the posterior horn of the lateral meniscus at the meniscal root. 3. Tricompartmental cartilage abnormalities described above. 4. Multiloculated cystic structure adjacent to the anterior aspect of the fibular head measuring 16 x 12 mm with intraosseous extension with the intraosseous component measuring 12 x 17 x 32 mm. This likely reflects a ganglion cyst with intraosseous and extraosseous components.  I have explained the findings to him.  I will have Dr. Aline Brochure see him for possible surgery. He agrees to this. HPI  Body mass index is 27.6 kg/m.  ROS  Review of Systems  Past Medical History:  Diagnosis Date  . Anxiety   . Anxiety disorder   . Arteriosclerotic cardiovascular disease (ASCVD)    with angina: 09/2009-100% proximal RCA; 50% proximal&60% mid LAD; 30-50% mid-Cx, normal EF  . Arthritis    Lyme disease in 10/2009 treated with antibiotics  . Chronic headaches   . Gastroesophageal reflux disease   . Hyperlipidemia    Total cholesterol-136, triglycerides of 89, HDL 48 and LDL of 70 in 03/2010  . Hypertension    Left ventricular hypertrophy  . Tobacco abuse, in remission    20 pack years; mild airway obstruction on PFTs in 2010    Past Surgical History:  Procedure Laterality Date  . APPENDECTOMY  1970  . COLONOSCOPY  2008  . inguinal hernia repair    . KNEE ARTHROSCOPY  W/ MENISCAL REPAIR  2006, 2012  . NASAL SINUS SURGERY    . TONSILLECTOMY  1962  . UMBILICAL HERNIA REPAIR  2003    Family History  Problem Relation Age of Onset  . Heart failure Unknown        conduction system disease requiring pacemaker  . Lupus Unknown        Sibling  . Colon cancer Unknown        Sibling  . Colon cancer Sister   . Breast cancer Sister   . Esophageal cancer Neg Hx   . Stomach cancer Neg Hx   . Rectal cancer Neg Hx     Social History Social History  Substance Use Topics  . Smoking status: Former Smoker    Packs/day: 1.00    Years: 20.00    Types: Cigarettes    Start date: 06/29/1971    Quit date: 06/04/1988  . Smokeless tobacco: Never Used  . Alcohol use 0.5 oz/week    1 drink(s) per week    No Known Allergies  Current Outpatient Prescriptions  Medication Sig Dispense Refill  . amLODipine (NORVASC) 5 MG tablet take 1 tablet by mouth once daily 30 tablet 6  . aspirin EC 81 MG tablet Take 1 tablet (81 mg total) by mouth daily. 90 tablet 3  . calcium carbonate (TUMS - DOSED IN MG ELEMENTAL CALCIUM) 500 MG chewable tablet Chew 1 tablet by mouth as needed.     Marland Kitchen esomeprazole (NEXIUM) 40 MG  capsule Take 22 mg by mouth daily before breakfast. Over the counter    . fish oil-omega-3 fatty acids 1000 MG capsule Take 2 g by mouth daily. Doesn't take regularly    . meloxicam (MOBIC) 15 MG tablet Take 15 mg by mouth daily.     . metoprolol tartrate (LOPRESSOR) 50 MG tablet take 1 tablet by mouth twice a day 60 tablet 3  . pravastatin (PRAVACHOL) 80 MG tablet take 1 tablet by mouth once daily 30 tablet 11   No current facility-administered medications for this visit.      Physical Exam  Blood pressure 115/69, pulse 61, temperature (!) 97.5 F (36.4 C), height 6\' 2"  (1.88 m), weight 215 lb (97.5 kg).  Constitutional: overall normal hygiene, normal nutrition, well developed, normal grooming, normal body habitus. Assistive device:none  Musculoskeletal: gait  and station Limp left, muscle tone and strength are normal, no tremors or atrophy is present.  .  Neurological: coordination overall normal.  Deep tendon reflex/nerve stretch intact.  Sensation normal.  Cranial nerves II-XII intact.   Skin:   Normal overall no scars, lesions, ulcers or rashes. No psoriasis.  Psychiatric: Alert and oriented x 3.  Recent memory intact, remote memory unclear.  Normal mood and affect. Well groomed.  Good eye contact.  Cardiovascular: overall no swelling, no varicosities, no edema bilaterally, normal temperatures of the legs and arms, no clubbing, cyanosis and good capillary refill.  Lymphatic: palpation is normal.  Left knee is tender with 1+ effusion.  He has ROM of 0 to 110.  He has medial joint line pain and pain over the fibula head area proximally.  He has limp to the left. The knee is stable with positive medial McMurray.  The patient has been educated about the nature of the problem(s) and counseled on treatment options.  The patient appeared to understand what I have discussed and is in agreement with it.  Encounter Diagnosis  Name Primary?  . Chronic pain of left knee Yes    PLAN Call if any problems.  Precautions discussed.  Continue current medications.   Return to clinic to see Dr. Aline Brochure.   Electronically Signed Sanjuana Kava, MD 7/24/20183:32 PM

## 2017-01-16 ENCOUNTER — Encounter: Payer: Self-pay | Admitting: Orthopedic Surgery

## 2017-01-16 ENCOUNTER — Ambulatory Visit (INDEPENDENT_AMBULATORY_CARE_PROVIDER_SITE_OTHER): Payer: BLUE CROSS/BLUE SHIELD | Admitting: Orthopedic Surgery

## 2017-01-16 VITALS — BP 139/78 | HR 62 | Ht 74.0 in | Wt 215.0 lb

## 2017-01-16 DIAGNOSIS — M6740 Ganglion, unspecified site: Secondary | ICD-10-CM

## 2017-01-16 DIAGNOSIS — M23301 Other meniscus derangements, unspecified lateral meniscus, left knee: Secondary | ICD-10-CM

## 2017-01-16 DIAGNOSIS — M1712 Unilateral primary osteoarthritis, left knee: Secondary | ICD-10-CM | POA: Diagnosis not present

## 2017-01-16 DIAGNOSIS — M6749 Ganglion, multiple sites: Secondary | ICD-10-CM

## 2017-01-16 DIAGNOSIS — S83242A Other tear of medial meniscus, current injury, left knee, initial encounter: Secondary | ICD-10-CM

## 2017-01-16 NOTE — Progress Notes (Signed)
Patient ID: Timothy Shaw, male   DOB: February 22, 1956, 61 y.o.   MRN: [id number]  Chief Complaint  Patient presents with  . Follow-up    Left knee pain, discuss surgery, referred from Dr. Luna Glasgow.    HPI Timothy Shaw is a 61 y.o. male.  I'm seeing today to discuss possible surgery in his left knee  He has medial and lateral pain. However he says he can live with his medial knee pain which is worse when he's been up on his feet for long time ago it up on the lateral for long time  He has some lateral leg pain which he thought was secondary to his back pain until he got his MRI results and it showed the head and intraosseous ganglion in a partial probable undersurface tear of his lateral meniscus.  However upon further review he mainly has an intraosseous ganglion which may or may not be causing his lateral symptoms. It was not symptomatic when I examined him today. So I am thinking that some of his discomfort laterally may be more neurologic in nature and we have decided to not schedule surgery Review of Systems Review of Systems  Constitutional: Negative.   Respiratory: Negative.   Cardiovascular: Negative.   Musculoskeletal: Positive for arthralgias, back pain and gait problem.    Past Medical History:  Diagnosis Date  . Anxiety   . Anxiety disorder   . Arteriosclerotic cardiovascular disease (ASCVD)    with angina: 09/2009-100% proximal RCA; 50% proximal&60% mid LAD; 30-50% mid-Cx, normal EF  . Arthritis    Lyme disease in 10/2009 treated with antibiotics  . Chronic headaches   . Gastroesophageal reflux disease   . Hyperlipidemia    Total cholesterol-136, triglycerides of 89, HDL 48 and LDL of 70 in 03/2010  . Hypertension    Left ventricular hypertrophy  . Tobacco abuse, in remission    20 pack years; mild airway obstruction on PFTs in 2010    Past Surgical History:  Procedure Laterality Date  . APPENDECTOMY  1970  . COLONOSCOPY  2008  . inguinal hernia repair    . KNEE  ARTHROSCOPY W/ MENISCAL REPAIR  2006, 2012  . NASAL SINUS SURGERY    . TONSILLECTOMY  1962  . UMBILICAL HERNIA REPAIR  2003    Family History  Problem Relation Age of Onset  . Heart failure Unknown        conduction system disease requiring pacemaker  . Lupus Unknown        Sibling  . Colon cancer Unknown        Sibling  . Colon cancer Sister   . Breast cancer Sister   . Esophageal cancer Neg Hx   . Stomach cancer Neg Hx   . Rectal cancer Neg Hx     Social History Social History  Substance Use Topics  . Smoking status: Former Smoker    Packs/day: 1.00    Years: 20.00    Types: Cigarettes    Start date: 06/29/1971    Quit date: 06/04/1988  . Smokeless tobacco: Never Used  . Alcohol use 0.5 oz/week    1 Standard drinks or equivalent per week    No Known Allergies  Current Outpatient Prescriptions  Medication Sig Dispense Refill  . amLODipine (NORVASC) 5 MG tablet take 1 tablet by mouth once daily 30 tablet 6  . aspirin EC 81 MG tablet Take 1 tablet (81 mg total) by mouth daily. 90 tablet 3  . calcium carbonate (TUMS -  DOSED IN MG ELEMENTAL CALCIUM) 500 MG chewable tablet Chew 1 tablet by mouth as needed.     Marland Kitchen esomeprazole (NEXIUM) 40 MG capsule Take 22 mg by mouth daily before breakfast. Over the counter    . fish oil-omega-3 fatty acids 1000 MG capsule Take 2 g by mouth daily. Doesn't take regularly    . meloxicam (MOBIC) 15 MG tablet Take 15 mg by mouth daily.     . metoprolol tartrate (LOPRESSOR) 50 MG tablet take 1 tablet by mouth twice a day 60 tablet 3  . pravastatin (PRAVACHOL) 80 MG tablet take 1 tablet by mouth once daily 30 tablet 11   No current facility-administered medications for this visit.        Physical Exam Physical Exam Blood pressure 139/78, pulse 62, height 6\' 2"  (1.88 m), weight 215 lb (97.5 kg). Appearance, there are no abnormalities in terms of appearance the patient was well-developed and well-nourished. The grooming and hygiene were  normal.  Mental status orientation, there was normal alertness and orientation Mood pleasant Ambulatory status normal with no assistive devices  Ortho Exam He does have a lot of medial joint line tenderness some lateral joint line tenderness he has no tenderness over the fibular head. He has no joint effusion. His range of motion is 120 of flexion no flexion contracture ligaments are stable motor exam is normal skin is intact pulses good he has normal sensation leg  On his other leg he has normal range of motion stability and alignment Data Reviewed Further imaging studies were reviewed with the corresponding reports  #1 he has osteoarthritis of the knee on plain film  #2 he has a torn medial meniscus he has some osteoarthritis in the knee joint as well as a questionable tear of the lateral meniscus at least degeneration and  #3 he has an intraosseous ganglion.    Assessment   torn medial meniscus  and lateral meniscus most likely with intraosseous ganglion left knee   Plan  Follow-up with Dr. Luna Glasgow regarding his leg pain. No surgery scheduled at this time. I did do a literature search on intraosseous ganglia. There were some cases of resection with injection of calcium phosphate bone cement  Perhaps we may want to send him to [facility] for further evaluation of that    Encounter Diagnoses  Name Primary?  . Acute medial meniscus tear of left knee, initial encounter Yes  . Intraosseous ganglion   . 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 Primary osteoarthritis of left knee     3:40 PM Arther Abbott, MD 01/16/2017

## 2017-01-28 ENCOUNTER — Other Ambulatory Visit: Payer: Self-pay | Admitting: Cardiovascular Disease

## 2017-02-07 DIAGNOSIS — M5416 Radiculopathy, lumbar region: Secondary | ICD-10-CM | POA: Diagnosis not present

## 2017-02-13 ENCOUNTER — Ambulatory Visit: Payer: BLUE CROSS/BLUE SHIELD | Admitting: Orthopaedic Surgery

## 2017-02-20 ENCOUNTER — Ambulatory Visit (INDEPENDENT_AMBULATORY_CARE_PROVIDER_SITE_OTHER): Payer: BLUE CROSS/BLUE SHIELD | Admitting: Orthopaedic Surgery

## 2017-02-20 ENCOUNTER — Encounter: Payer: Self-pay | Admitting: Orthopaedic Surgery

## 2017-02-20 VITALS — BP 125/68 | HR 59 | Temp 96.9°F | Ht 74.0 in | Wt 216.0 lb

## 2017-02-20 DIAGNOSIS — G8929 Other chronic pain: Secondary | ICD-10-CM

## 2017-02-20 DIAGNOSIS — M6740 Ganglion, unspecified site: Secondary | ICD-10-CM

## 2017-02-20 DIAGNOSIS — M6749 Ganglion, multiple sites: Secondary | ICD-10-CM

## 2017-02-20 DIAGNOSIS — M25562 Pain in left knee: Secondary | ICD-10-CM

## 2017-02-20 DIAGNOSIS — M1712 Unilateral primary osteoarthritis, left knee: Secondary | ICD-10-CM

## 2017-02-20 DIAGNOSIS — M23301 Other meniscus derangements, unspecified lateral meniscus, left knee: Secondary | ICD-10-CM

## 2017-02-20 NOTE — Progress Notes (Signed)
CC:  I have pain of my left knee. I would like an injection.  The patient has chronic pain of the left knee.  There is no recent trauma.  There is no redness.  Injections in the past have helped.  The knee has no redness, has an effusion and crepitus present.  ROM of the left knee is 0-110.  Impression:  Chronic knee pain left  Return: prn  PROCEDURE NOTE:  The patient requests injections of the left knee, verbal consent was obtained.  The left knee was prepped appropriately after time out was performed.   Sterile technique was observed and injection of 1 cc of Depo-Medrol 40 mg with several cc's of plain xylocaine. Anesthesia was provided by ethyl chloride and a 20-gauge needle was used to inject the knee area. The injection was tolerated well.  A band aid dressing was applied.  The patient was advised to apply ice later today and tomorrow to the injection sight as needed.  He will think about possible surgery on the knee.  He will contact Dr. Aline Brochure if he wants it done.  Electronically Signed Sanjuana Kava, MD 9/19/20183:52 PM

## 2017-03-19 ENCOUNTER — Other Ambulatory Visit: Payer: Self-pay | Admitting: Cardiovascular Disease

## 2017-04-15 ENCOUNTER — Other Ambulatory Visit: Payer: Self-pay

## 2017-04-15 DIAGNOSIS — I10 Essential (primary) hypertension: Secondary | ICD-10-CM

## 2017-04-15 DIAGNOSIS — I2510 Atherosclerotic heart disease of native coronary artery without angina pectoris: Secondary | ICD-10-CM

## 2017-04-15 MED ORDER — METOPROLOL TARTRATE 50 MG PO TABS: 50.0000 mg | ORAL_TABLET | Freq: Two times a day (BID) | ORAL | 3 refills | Status: DC

## 2017-04-15 NOTE — Telephone Encounter (Signed)
refilled lopressor per fax request 

## 2017-04-19 DIAGNOSIS — M23362 Other meniscus derangements, other lateral meniscus, left knee: Secondary | ICD-10-CM | POA: Diagnosis not present

## 2017-04-19 DIAGNOSIS — M23322 Other meniscus derangements, posterior horn of medial meniscus, left knee: Secondary | ICD-10-CM | POA: Diagnosis not present

## 2017-04-19 DIAGNOSIS — G8918 Other acute postprocedural pain: Secondary | ICD-10-CM | POA: Diagnosis not present

## 2017-04-19 DIAGNOSIS — M23262 Derangement of other lateral meniscus due to old tear or injury, left knee: Secondary | ICD-10-CM | POA: Diagnosis not present

## 2017-04-19 DIAGNOSIS — M94262 Chondromalacia, left knee: Secondary | ICD-10-CM | POA: Diagnosis not present

## 2017-04-19 DIAGNOSIS — M23222 Derangement of posterior horn of medial meniscus due to old tear or injury, left knee: Secondary | ICD-10-CM | POA: Diagnosis not present

## 2017-04-29 DIAGNOSIS — R739 Hyperglycemia, unspecified: Secondary | ICD-10-CM | POA: Diagnosis not present

## 2017-04-29 DIAGNOSIS — E785 Hyperlipidemia, unspecified: Secondary | ICD-10-CM | POA: Diagnosis not present

## 2017-04-29 DIAGNOSIS — I2510 Atherosclerotic heart disease of native coronary artery without angina pectoris: Secondary | ICD-10-CM | POA: Diagnosis not present

## 2017-04-29 DIAGNOSIS — I10 Essential (primary) hypertension: Secondary | ICD-10-CM | POA: Diagnosis not present

## 2017-04-29 DIAGNOSIS — L02415 Cutaneous abscess of right lower limb: Secondary | ICD-10-CM | POA: Diagnosis not present

## 2017-04-30 DIAGNOSIS — Z9889 Other specified postprocedural states: Secondary | ICD-10-CM | POA: Diagnosis not present

## 2017-05-01 DIAGNOSIS — M25562 Pain in left knee: Secondary | ICD-10-CM | POA: Diagnosis not present

## 2017-05-01 DIAGNOSIS — M23322 Other meniscus derangements, posterior horn of medial meniscus, left knee: Secondary | ICD-10-CM | POA: Diagnosis not present

## 2017-05-01 DIAGNOSIS — Z9889 Other specified postprocedural states: Secondary | ICD-10-CM | POA: Diagnosis not present

## 2017-05-01 DIAGNOSIS — M25662 Stiffness of left knee, not elsewhere classified: Secondary | ICD-10-CM | POA: Diagnosis not present

## 2017-05-07 DIAGNOSIS — Z9889 Other specified postprocedural states: Secondary | ICD-10-CM | POA: Diagnosis not present

## 2017-05-07 DIAGNOSIS — M25562 Pain in left knee: Secondary | ICD-10-CM | POA: Diagnosis not present

## 2017-05-07 DIAGNOSIS — M23322 Other meniscus derangements, posterior horn of medial meniscus, left knee: Secondary | ICD-10-CM | POA: Diagnosis not present

## 2017-05-07 DIAGNOSIS — M25662 Stiffness of left knee, not elsewhere classified: Secondary | ICD-10-CM | POA: Diagnosis not present

## 2017-05-09 DIAGNOSIS — M25562 Pain in left knee: Secondary | ICD-10-CM | POA: Diagnosis not present

## 2017-05-09 DIAGNOSIS — M23322 Other meniscus derangements, posterior horn of medial meniscus, left knee: Secondary | ICD-10-CM | POA: Diagnosis not present

## 2017-05-09 DIAGNOSIS — M25662 Stiffness of left knee, not elsewhere classified: Secondary | ICD-10-CM | POA: Diagnosis not present

## 2017-05-09 DIAGNOSIS — Z9889 Other specified postprocedural states: Secondary | ICD-10-CM | POA: Diagnosis not present

## 2017-05-14 DIAGNOSIS — M25562 Pain in left knee: Secondary | ICD-10-CM | POA: Diagnosis not present

## 2017-05-14 DIAGNOSIS — M25662 Stiffness of left knee, not elsewhere classified: Secondary | ICD-10-CM | POA: Diagnosis not present

## 2017-05-14 DIAGNOSIS — M23322 Other meniscus derangements, posterior horn of medial meniscus, left knee: Secondary | ICD-10-CM | POA: Diagnosis not present

## 2017-05-14 DIAGNOSIS — Z9889 Other specified postprocedural states: Secondary | ICD-10-CM | POA: Diagnosis not present

## 2017-05-16 DIAGNOSIS — S83242A Other tear of medial meniscus, current injury, left knee, initial encounter: Secondary | ICD-10-CM | POA: Diagnosis not present

## 2017-05-16 DIAGNOSIS — M1711 Unilateral primary osteoarthritis, right knee: Secondary | ICD-10-CM | POA: Diagnosis not present

## 2017-05-20 DIAGNOSIS — M23322 Other meniscus derangements, posterior horn of medial meniscus, left knee: Secondary | ICD-10-CM | POA: Diagnosis not present

## 2017-05-20 DIAGNOSIS — M25662 Stiffness of left knee, not elsewhere classified: Secondary | ICD-10-CM | POA: Diagnosis not present

## 2017-05-20 DIAGNOSIS — M25562 Pain in left knee: Secondary | ICD-10-CM | POA: Diagnosis not present

## 2017-05-20 DIAGNOSIS — Z9889 Other specified postprocedural states: Secondary | ICD-10-CM | POA: Diagnosis not present

## 2017-05-21 DIAGNOSIS — Z0000 Encounter for general adult medical examination without abnormal findings: Secondary | ICD-10-CM | POA: Diagnosis not present

## 2017-05-22 DIAGNOSIS — M25662 Stiffness of left knee, not elsewhere classified: Secondary | ICD-10-CM | POA: Diagnosis not present

## 2017-05-22 DIAGNOSIS — M23322 Other meniscus derangements, posterior horn of medial meniscus, left knee: Secondary | ICD-10-CM | POA: Diagnosis not present

## 2017-05-22 DIAGNOSIS — M25562 Pain in left knee: Secondary | ICD-10-CM | POA: Diagnosis not present

## 2017-05-22 DIAGNOSIS — Z9889 Other specified postprocedural states: Secondary | ICD-10-CM | POA: Diagnosis not present

## 2017-05-30 ENCOUNTER — Other Ambulatory Visit: Payer: Self-pay

## 2017-05-30 MED ORDER — PRAVASTATIN SODIUM 80 MG PO TABS: 80.0000 mg | ORAL_TABLET | Freq: Every day | ORAL | 1 refills | Status: DC

## 2017-05-30 NOTE — Telephone Encounter (Signed)
Refilled pravastatin per request

## 2017-06-05 DIAGNOSIS — Z9889 Other specified postprocedural states: Secondary | ICD-10-CM | POA: Diagnosis not present

## 2017-06-05 DIAGNOSIS — M25662 Stiffness of left knee, not elsewhere classified: Secondary | ICD-10-CM | POA: Diagnosis not present

## 2017-06-05 DIAGNOSIS — M23322 Other meniscus derangements, posterior horn of medial meniscus, left knee: Secondary | ICD-10-CM | POA: Diagnosis not present

## 2017-06-05 DIAGNOSIS — M25562 Pain in left knee: Secondary | ICD-10-CM | POA: Diagnosis not present

## 2017-06-11 DIAGNOSIS — M25562 Pain in left knee: Secondary | ICD-10-CM | POA: Diagnosis not present

## 2017-06-25 ENCOUNTER — Encounter: Payer: Self-pay | Admitting: Cardiovascular Disease

## 2017-06-25 ENCOUNTER — Ambulatory Visit: Payer: BLUE CROSS/BLUE SHIELD | Admitting: Cardiovascular Disease

## 2017-06-25 VITALS — BP 126/82 | HR 65 | Ht 74.0 in | Wt 222.0 lb

## 2017-06-25 DIAGNOSIS — I25118 Atherosclerotic heart disease of native coronary artery with other forms of angina pectoris: Secondary | ICD-10-CM

## 2017-06-25 DIAGNOSIS — I10 Essential (primary) hypertension: Secondary | ICD-10-CM | POA: Diagnosis not present

## 2017-06-25 DIAGNOSIS — E785 Hyperlipidemia, unspecified: Secondary | ICD-10-CM

## 2017-06-25 NOTE — Progress Notes (Signed)
SUBJECTIVE: The patient presents for annual follow-up.  He has a history of coronary artery disease, hyperlipidemia, and hypertension.  The patient denies any symptoms of chest pain, palpitations, shortness of breath, lightheadedness, dizziness, orthopnea, PND, and syncope.  ECG performed in the office today which I ordered and personally interpreted demonstrates normal sinus rhythm with no ischemic ST segment or T-wave abnormalities, nor any arrhythmias.  He has some left leg swelling at the end of a long day of standing and does have significant venous varicosities in that leg.  His primary complaint relates to left leg sciatica.  He has been receiving injections but thinks he needs to proceed with surgery.  He stays tired but says his sleep study was borderline for sleep apnea and he does have low testosterone levels when checked by his PCP.  He does not exercise much but does try to walk his dog daily.    Review of Systems: As per "subjective", otherwise negative.  No Known Allergies  Current Outpatient Medications  Medication Sig Dispense Refill  . amLODipine (NORVASC) 5 MG tablet take 1 tablet by mouth once daily 90 tablet 3  . aspirin EC 81 MG tablet Take 1 tablet (81 mg total) by mouth daily. 90 tablet 3  . calcium carbonate (TUMS - DOSED IN MG ELEMENTAL CALCIUM) 500 MG chewable tablet Chew 1 tablet by mouth as needed.     Marland Kitchen esomeprazole (NEXIUM) 40 MG capsule Take 22 mg by mouth daily before breakfast. Over the counter    . fish oil-omega-3 fatty acids 1000 MG capsule Take 2 g by mouth daily. Doesn't take regularly    . meloxicam (MOBIC) 15 MG tablet Take 15 mg by mouth daily.     . metoprolol tartrate (LOPRESSOR) 50 MG tablet Take 1 tablet (50 mg total) 2 (two) times daily by mouth. 60 tablet 3  . pravastatin (PRAVACHOL) 80 MG tablet Take 1 tablet (80 mg total) by mouth daily. 90 tablet 1   No current facility-administered medications for this visit.     Past  Medical History:  Diagnosis Date  . Anxiety   . Anxiety disorder   . Arteriosclerotic cardiovascular disease (ASCVD)    with angina: 09/2009-100% proximal RCA; 50% proximal&60% mid LAD; 30-50% mid-Cx, normal EF  . Arthritis    Lyme disease in 10/2009 treated with antibiotics  . Chronic headaches   . Gastroesophageal reflux disease   . Hyperlipidemia    Total cholesterol-136, triglycerides of 89, HDL 48 and LDL of 70 in 03/2010  . Hypertension    Left ventricular hypertrophy  . Tobacco abuse, in remission    20 pack years; mild airway obstruction on PFTs in 2010    Past Surgical History:  Procedure Laterality Date  . APPENDECTOMY  1970  . COLONOSCOPY  2008  . inguinal hernia repair    . KNEE ARTHROSCOPY W/ MENISCAL REPAIR  2006, 2012  . NASAL SINUS SURGERY    . TONSILLECTOMY  1962  . UMBILICAL HERNIA REPAIR  2003    Social History   Socioeconomic History  . Marital status: Married    Spouse name: Not on file  . Number of children: 1  . Years of education: Not on file  . Highest education level: Not on file  Social Needs  . Financial resource strain: Not on file  . Food insecurity - worry: Not on file  . Food insecurity - inability: Not on file  . Transportation needs - medical: Not on  file  . Transportation needs - non-medical: Not on file  Occupational History  . Occupation: Air traffic controller  Tobacco Use  . Smoking status: Former Smoker    Packs/day: 1.00    Years: 20.00    Pack years: 20.00    Types: Cigarettes    Start date: 06/29/1971    Last attempt to quit: 06/04/1988    Years since quitting: 29.0  . Smokeless tobacco: Never Used  Substance and Sexual Activity  . Alcohol use: Yes    Alcohol/week: 0.5 oz    Types: 1 Standard drinks or equivalent per week  . Drug use: Not on file  . Sexual activity: Not on file  Other Topics Concern  . Not on file  Social History Narrative  . Not on file     Vitals:   06/25/17 1552  BP: 126/82  Pulse: 65    SpO2: 97%  Weight: 222 lb (100.7 kg)  Height: 6\' 2"  (1.88 m)    Wt Readings from Last 3 Encounters:  06/25/17 222 lb (100.7 kg)  02/20/17 216 lb (98 kg)  01/16/17 215 lb (97.5 kg)     PHYSICAL EXAM General: NAD HEENT: Normal. Neck: No JVD, no thyromegaly. Lungs: Clear to auscultation bilaterally with normal respiratory effort. CV: Regular rate and rhythm, normal S1/S2, no S3/S4, no murmur. No pretibial or periankle edema.  No carotid bruit.   Abdomen: Soft, nontender, no distention.  Neurologic: Alert and oriented.  Psych: Normal affect. Skin: Normal. Musculoskeletal: No gross deformities.    ECG: Most recent ECG reviewed.   Labs: Lab Results  Component Value Date/Time   K 4.9 12/16/2012 08:20 AM   BUN 15 12/16/2012 08:20 AM   CREATININE 0.99 12/16/2012 08:20 AM   ALT 16 02/26/2014 08:48 AM   TSH 1.470 12/16/2012 08:20 AM   HGB 13.6 12/16/2012 08:20 AM     Lipids: Lab Results  Component Value Date/Time   LDLCALC 72 02/26/2014 08:48 AM   LDLCALC 82 01/17/2010 12:00 AM   CHOL 137 02/26/2014 08:48 AM   TRIG 94 02/26/2014 08:48 AM   TRIG 70 01/17/2010 12:00 AM   HDL 46 02/26/2014 08:48 AM       ASSESSMENT AND PLAN: 1.  Coronary disease: Symptomatically stable.  Continue aspirin, metoprolol, and pravastatin.  2.  Hypertension: Controlled on present therapy.  No changes.  3.  Hyperlipidemia: I will obtain a copy of lipids from his PCP.  Continue pravastatin.    Disposition: Follow up 1 year.   Kate Sable, M.D., F.A.C.C.

## 2017-06-25 NOTE — Patient Instructions (Signed)
Your physician wants you to follow-up in: 1 year with Dr.Koneswaran You will receive a reminder letter in the mail two months in advance. If you don't receive a letter, please call our office to schedule the follow-up appointment.    Your physician recommends that you continue on your current medications as directed. Please refer to the Current Medication list given to you today.    If you need a refill on your cardiac medications before your next appointment, please call your pharmacy.     No tests ordered today.    Thank you for choosing [facility] !

## 2017-07-03 DIAGNOSIS — M5416 Radiculopathy, lumbar region: Secondary | ICD-10-CM | POA: Diagnosis not present

## 2017-07-08 DIAGNOSIS — M5416 Radiculopathy, lumbar region: Secondary | ICD-10-CM | POA: Diagnosis not present

## 2017-07-15 DIAGNOSIS — M545 Low back pain: Secondary | ICD-10-CM | POA: Diagnosis not present

## 2017-07-17 DIAGNOSIS — M5416 Radiculopathy, lumbar region: Secondary | ICD-10-CM | POA: Diagnosis not present

## 2017-07-19 DIAGNOSIS — M5416 Radiculopathy, lumbar region: Secondary | ICD-10-CM | POA: Diagnosis not present

## 2017-07-23 ENCOUNTER — Other Ambulatory Visit: Payer: Self-pay | Admitting: Cardiovascular Disease

## 2017-07-23 DIAGNOSIS — I10 Essential (primary) hypertension: Secondary | ICD-10-CM

## 2017-07-23 DIAGNOSIS — I2510 Atherosclerotic heart disease of native coronary artery without angina pectoris: Secondary | ICD-10-CM

## 2017-08-02 DIAGNOSIS — M5416 Radiculopathy, lumbar region: Secondary | ICD-10-CM | POA: Diagnosis not present

## 2017-08-08 ENCOUNTER — Other Ambulatory Visit: Payer: Self-pay | Admitting: Orthopedic Surgery

## 2017-08-14 NOTE — Pre-Procedure Instructions (Signed)
IZAIHA LO  08/14/2017      [facility], [place] - [address] AT [address] [place] [address] Phone: [phone] Fax: [phone]    Your procedure is scheduled on Wednesday, August 21, 2017  Report to [facility] Admitting Entrance "A" at 6:30AM   Call this number if you have problems the morning of surgery:  [phone]   Remember:  Do not eat food or drink liquids after midnight.  Take these medicines the morning of surgery with A SIP OF WATER: AmLODipine (NORVASC) and Metoprolol tartrate (LOPRESSOR)  Follow your doctor's instruction regarding Aspirin.  As of today, stop taking all Aspirins, Vitamins, Fish oils, and Herbal medications. Also stop all NSAIDS i.e. Advil, Ibuprofen, Motrin, Aleve, Anaprox, Naproxen, BC and Goody Powders.   Do not wear jewelry.  Do not wear lotions, powders, colognes, or deodorant.  Do not shave 48 hours prior to surgery.  Men may shave face and neck.  Do not bring valuables to the hospital.  [facility] is not responsible for any belongings or valuables.  Contacts, dentures or bridgework may not be worn into surgery.  Leave your suitcase in the car.  After surgery it may be brought to your room.  For patients admitted to the hospital, discharge time will be determined by your treatment team.  Patients discharged the day of surgery will not be allowed to drive home.   Special instructions:   [facility]- Preparing For Surgery  Before surgery, you can play an important role. Because skin is not sterile, your skin needs to be as free of germs as possible. You can reduce the number of germs on your skin by washing with CHG (chlorahexidine gluconate) Soap before surgery.  CHG is an antiseptic cleaner which kills germs and bonds with the skin to continue killing germs even after washing.  Please do not use if you have an allergy to CHG or  antibacterial soaps. If your skin becomes reddened/irritated stop using the CHG.  Do not shave (including legs and underarms) for at least 48 hours prior to first CHG shower. It is OK to shave your face.  Please follow these instructions carefully.   1. Shower the NIGHT BEFORE SURGERY and the MORNING OF SURGERY with CHG.   2. If you chose to wash your hair, wash your hair first as usual with your normal shampoo.  3. After you shampoo, rinse your hair and body thoroughly to remove the shampoo.  4. Use CHG as you would any other liquid soap. You can apply CHG directly to the skin and wash gently with a scrungie or a clean washcloth.   5. Apply the CHG Soap to your body ONLY FROM THE NECK DOWN.  Do not use on open wounds or open sores. Avoid contact with your eyes, ears, mouth and genitals (private parts). Wash Face and genitals (private parts)  with your normal soap.  6. Wash thoroughly, paying special attention to the area where your surgery will be performed.  7. Thoroughly rinse your body with warm water from the neck down.  8. DO NOT shower/wash with your normal soap after using and rinsing off the CHG Soap.  9. Pat yourself dry with a CLEAN TOWEL.  10. Wear CLEAN PAJAMAS to bed the night before surgery, wear comfortable clothes the morning of surgery  11. Place CLEAN SHEETS on your bed the night of your first shower  and DO NOT SLEEP WITH PETS.  Day of Surgery: Do not apply any deodorants/lotions. Please wear clean clothes to the hospital/surgery center.    Please read over the following fact sheets that you were given. Pain Booklet, Coughing and Deep Breathing, MRSA Information and Surgical Site Infection Prevention

## 2017-08-15 ENCOUNTER — Ambulatory Visit (HOSPITAL_COMMUNITY)
Admission: RE | Admit: 2017-08-15 | Discharge: 2017-08-15 | Disposition: A | Discharge status: Home / Self Care | Payer: BLUE CROSS/BLUE SHIELD | Source: Ambulatory Visit | Attending: Orthopedic Surgery | Admitting: Orthopedic Surgery

## 2017-08-15 ENCOUNTER — Encounter (HOSPITAL_COMMUNITY)
Admission: RE | Admit: 2017-08-15 | Discharge: 2017-08-15 | Disposition: A | Discharge status: Home / Self Care | Payer: BLUE CROSS/BLUE SHIELD | Source: Ambulatory Visit | Attending: Orthopedic Surgery | Admitting: Orthopedic Surgery

## 2017-08-15 ENCOUNTER — Encounter (HOSPITAL_COMMUNITY): Payer: Self-pay

## 2017-08-15 DIAGNOSIS — M5416 Radiculopathy, lumbar region: Secondary | ICD-10-CM | POA: Diagnosis not present

## 2017-08-15 DIAGNOSIS — R918 Other nonspecific abnormal finding of lung field: Secondary | ICD-10-CM | POA: Insufficient documentation

## 2017-08-15 DIAGNOSIS — Z01818 Encounter for other preprocedural examination: Secondary | ICD-10-CM | POA: Insufficient documentation

## 2017-08-15 DIAGNOSIS — J9811 Atelectasis: Secondary | ICD-10-CM | POA: Diagnosis not present

## 2017-08-15 DIAGNOSIS — M79605 Pain in left leg: Secondary | ICD-10-CM | POA: Insufficient documentation

## 2017-08-15 HISTORY — DX: Other specified postprocedural states: R11.2

## 2017-08-15 HISTORY — DX: Other specified postprocedural states: Z98.890

## 2017-08-15 LAB — COMPREHENSIVE METABOLIC PANEL
ALT: 19 U/L (ref 17–63)
AST: 19 U/L (ref 15–41)
Albumin: 4 g/dL (ref 3.5–5.0)
Alkaline Phosphatase: 69 U/L (ref 38–126)
Anion gap: 10 (ref 5–15)
BUN: 16 mg/dL (ref 6–20)
CO2: 24 mmol/L (ref 22–32)
Calcium: 9.2 mg/dL (ref 8.9–10.3)
Chloride: 105 mmol/L (ref 101–111)
Creatinine, Ser: 0.95 mg/dL (ref 0.61–1.24)
GFR calc Af Amer: >60 mL/min (ref >60)
GFR calc non Af Amer: >60 mL/min (ref >60)
Glucose, Bld: 96 mg/dL (ref 65–99)
Potassium: 4.2 mmol/L (ref 3.5–5.1)
Sodium: 139 mmol/L (ref 135–145)
Total Bilirubin: 0.4 mg/dL (ref 0.3–1.2)
Total Protein: 7 g/dL (ref 6.5–8.1)

## 2017-08-15 LAB — CBC WITH DIFFERENTIAL/PLATELET
Basophils Absolute: 0.1 10*3/uL (ref 0.0–0.1)
Basophils Relative: 1 %
Eosinophils Absolute: 0.1 10*3/uL (ref 0.0–0.7)
Eosinophils Relative: 2 %
HCT: 36.8 % — ABNORMAL LOW (ref 39.0–52.0)
Hemoglobin: 11.6 g/dL — ABNORMAL LOW (ref 13.0–17.0)
Lymphocytes Relative: 32 %
Lymphs Abs: 2.3 10*3/uL (ref 0.7–4.0)
MCH: 26.2 pg (ref 26.0–34.0)
MCHC: 31.5 g/dL (ref 30.0–36.0)
MCV: 83.1 fL (ref 78.0–100.0)
Monocytes Absolute: 0.7 10*3/uL (ref 0.1–1.0)
Monocytes Relative: 10 %
Neutro Abs: 4 10*3/uL (ref 1.7–7.7)
Neutrophils Relative %: 55 %
Platelets: 319 10*3/uL (ref 150–400)
RBC: 4.43 MIL/uL (ref 4.22–5.81)
RDW: 13.3 % (ref 11.5–15.5)
WBC: 7.2 10*3/uL (ref 4.0–10.5)

## 2017-08-15 LAB — URINALYSIS, ROUTINE W REFLEX MICROSCOPIC
Bilirubin Urine: NEGATIVE
Glucose, UA: NEGATIVE mg/dL
Hgb urine dipstick: NEGATIVE
Ketones, ur: NEGATIVE mg/dL
Leukocytes, UA: NEGATIVE
Nitrite: NEGATIVE
Protein, ur: NEGATIVE mg/dL
Specific Gravity, Urine: 1.019 (ref 1.005–1.030)
pH: 5 (ref 5.0–8.0)

## 2017-08-15 LAB — PROTIME-INR
INR: 1.01
Prothrombin Time: 13.2 seconds (ref 11.4–15.2)

## 2017-08-15 LAB — TYPE AND SCREEN
ABO/RH(D): A NEG
Antibody Screen: NEGATIVE

## 2017-08-15 LAB — SURGICAL PCR SCREEN
MRSA, PCR: NEGATIVE
Staphylococcus aureus: NEGATIVE

## 2017-08-15 LAB — APTT: aPTT: 29 seconds (ref 24–36)

## 2017-08-15 LAB — ABO/RH: ABO/RH(D): A NEG

## 2017-08-21 ENCOUNTER — Inpatient Hospital Stay (HOSPITAL_COMMUNITY)
Admission: RE | Disposition: A | Discharge status: Home / Self Care | Payer: Self-pay | Source: Ambulatory Visit | Attending: Orthopedic Surgery

## 2017-08-21 ENCOUNTER — Inpatient Hospital Stay (HOSPITAL_COMMUNITY): Payer: BLUE CROSS/BLUE SHIELD

## 2017-08-21 ENCOUNTER — Inpatient Hospital Stay (HOSPITAL_COMMUNITY): Payer: BLUE CROSS/BLUE SHIELD | Admitting: Anesthesiology

## 2017-08-21 ENCOUNTER — Encounter (HOSPITAL_COMMUNITY): Payer: Self-pay | Admitting: *Deleted

## 2017-08-21 ENCOUNTER — Inpatient Hospital Stay (HOSPITAL_COMMUNITY)
Admission: RE | Admit: 2017-08-21 | Discharge: 2017-08-22 | DRG: 455 | Disposition: A | Discharge status: Home / Self Care | Payer: BLUE CROSS/BLUE SHIELD | Source: Ambulatory Visit | Attending: Orthopedic Surgery | Admitting: Orthopedic Surgery

## 2017-08-21 DIAGNOSIS — M48061 Spinal stenosis, lumbar region without neurogenic claudication: Secondary | ICD-10-CM | POA: Diagnosis not present

## 2017-08-21 DIAGNOSIS — Z7982 Long term (current) use of aspirin: Secondary | ICD-10-CM | POA: Diagnosis not present

## 2017-08-21 DIAGNOSIS — K219 Gastro-esophageal reflux disease without esophagitis: Secondary | ICD-10-CM | POA: Diagnosis not present

## 2017-08-21 DIAGNOSIS — Z87891 Personal history of nicotine dependence: Secondary | ICD-10-CM | POA: Diagnosis not present

## 2017-08-21 DIAGNOSIS — I10 Essential (primary) hypertension: Secondary | ICD-10-CM | POA: Diagnosis present

## 2017-08-21 DIAGNOSIS — M4326 Fusion of spine, lumbar region: Secondary | ICD-10-CM | POA: Diagnosis not present

## 2017-08-21 DIAGNOSIS — Z803 Family history of malignant neoplasm of breast: Secondary | ICD-10-CM | POA: Diagnosis not present

## 2017-08-21 DIAGNOSIS — M4316 Spondylolisthesis, lumbar region: Secondary | ICD-10-CM | POA: Diagnosis not present

## 2017-08-21 DIAGNOSIS — Z791 Long term (current) use of non-steroidal anti-inflammatories (NSAID): Secondary | ICD-10-CM

## 2017-08-21 DIAGNOSIS — M5416 Radiculopathy, lumbar region: Secondary | ICD-10-CM | POA: Diagnosis not present

## 2017-08-21 DIAGNOSIS — M4310 Spondylolisthesis, site unspecified: Secondary | ICD-10-CM | POA: Diagnosis present

## 2017-08-21 DIAGNOSIS — I2510 Atherosclerotic heart disease of native coronary artery without angina pectoris: Secondary | ICD-10-CM | POA: Diagnosis present

## 2017-08-21 DIAGNOSIS — E785 Hyperlipidemia, unspecified: Secondary | ICD-10-CM | POA: Diagnosis present

## 2017-08-21 DIAGNOSIS — M79605 Pain in left leg: Secondary | ICD-10-CM | POA: Diagnosis not present

## 2017-08-21 DIAGNOSIS — Z800 Family history of malignant neoplasm of digestive organs: Secondary | ICD-10-CM | POA: Diagnosis not present

## 2017-08-21 DIAGNOSIS — Z981 Arthrodesis status: Secondary | ICD-10-CM | POA: Diagnosis not present

## 2017-08-21 DIAGNOSIS — M5410 Radiculopathy, site unspecified: Secondary | ICD-10-CM | POA: Diagnosis present

## 2017-08-21 DIAGNOSIS — Z419 Encounter for procedure for purposes other than remedying health state, unspecified: Secondary | ICD-10-CM

## 2017-08-21 SURGERY — POSTERIOR LUMBAR FUSION 2 LEVEL
Anesthesia: General | Site: Spine Lumbar | Laterality: Left

## 2017-08-21 MED ORDER — BUPIVACAINE-EPINEPHRINE 0.25% -1:200000 IJ SOLN
INTRAMUSCULAR | Status: AC
  Filled 2017-08-21: qty 1

## 2017-08-21 MED ORDER — DEXAMETHASONE SODIUM PHOSPHATE 10 MG/ML IJ SOLN
INTRAMUSCULAR | Status: AC
  Filled 2017-08-21: qty 1

## 2017-08-21 MED ORDER — SUGAMMADEX SODIUM 200 MG/2ML IV SOLN
INTRAVENOUS | Status: DC | PRN
  Administered 2017-08-21: 200 mg via INTRAVENOUS

## 2017-08-21 MED ORDER — PHENOL 1.4 % MT LIQD: 1.0000 | OROMUCOSAL | Status: DC | PRN

## 2017-08-21 MED ORDER — PROPOFOL 10 MG/ML IV BOLUS
INTRAVENOUS | Status: DC | PRN
  Administered 2017-08-21: 30 mg via INTRAVENOUS
  Administered 2017-08-21: 20 mg via INTRAVENOUS
  Administered 2017-08-21: 200 mg via INTRAVENOUS
  Administered 2017-08-21: 40 mg via INTRAVENOUS
  Administered 2017-08-21: 20 mg via INTRAVENOUS

## 2017-08-21 MED ORDER — METHYLENE BLUE 0.5 % INJ SOLN
INTRAVENOUS | Status: AC
  Filled 2017-08-21: qty 10

## 2017-08-21 MED ORDER — MIDAZOLAM HCL 5 MG/5ML IJ SOLN
INTRAMUSCULAR | Status: DC | PRN
  Administered 2017-08-21: 2 mg via INTRAVENOUS

## 2017-08-21 MED ORDER — SODIUM CHLORIDE 0.9 % IV SOLN: INTRAVENOUS | Status: DC | PRN

## 2017-08-21 MED ORDER — ALUM & MAG HYDROXIDE-SIMETH 200-200-20 MG/5ML PO SUSP: 30.0000 mL | Freq: Four times a day (QID) | ORAL | Status: DC | PRN

## 2017-08-21 MED ORDER — HYDROMORPHONE HCL 1 MG/ML IJ SOLN: 0.2500 mg | INTRAMUSCULAR | Status: DC | PRN

## 2017-08-21 MED ORDER — PROPOFOL 500 MG/50ML IV EMUL
INTRAVENOUS | Status: AC
  Filled 2017-08-21: qty 100

## 2017-08-21 MED ORDER — PANTOPRAZOLE SODIUM 40 MG IV SOLR: 40.0000 mg | Freq: Every day | INTRAVENOUS | Status: DC

## 2017-08-21 MED ORDER — 0.9 % SODIUM CHLORIDE (POUR BTL) OPTIME
TOPICAL | Status: DC | PRN
  Administered 2017-08-21 (×2): 1000 mL

## 2017-08-21 MED ORDER — PROPOFOL 1000 MG/100ML IV EMUL
INTRAVENOUS | Status: AC
  Filled 2017-08-21: qty 200

## 2017-08-21 MED ORDER — SCOPOLAMINE 1 MG/3DAYS TD PT72
MEDICATED_PATCH | TRANSDERMAL | Status: DC | PRN
  Administered 2017-08-21: 1 via TRANSDERMAL

## 2017-08-21 MED ORDER — MIDAZOLAM HCL 2 MG/2ML IJ SOLN
INTRAMUSCULAR | Status: AC
  Filled 2017-08-21: qty 2

## 2017-08-21 MED ORDER — FENTANYL CITRATE (PF) 250 MCG/5ML IJ SOLN
INTRAMUSCULAR | Status: AC
  Filled 2017-08-21: qty 5

## 2017-08-21 MED ORDER — OXYCODONE HCL 5 MG PO TABS: 5.0000 mg | ORAL_TABLET | Freq: Once | ORAL | Status: DC | PRN

## 2017-08-21 MED ORDER — METHYLENE BLUE 0.5 % INJ SOLN
INTRAVENOUS | Status: DC | PRN
  Administered 2017-08-21: 10 mL via SUBMUCOSAL

## 2017-08-21 MED ORDER — PROPOFOL 10 MG/ML IV BOLUS
INTRAVENOUS | Status: AC
  Filled 2017-08-21: qty 40

## 2017-08-21 MED ORDER — DOCUSATE SODIUM 100 MG PO CAPS
100.0000 mg | ORAL_CAPSULE | Freq: Two times a day (BID) | ORAL | Status: DC
  Administered 2017-08-21 – 2017-08-22 (×2): 100 mg via ORAL
  Filled 2017-08-21 (×2): qty 1

## 2017-08-21 MED ORDER — THROMBIN (RECOMBINANT) 20000 UNITS EX SOLR
CUTANEOUS | Status: DC | PRN
  Administered 2017-08-21: 20 mL via TOPICAL

## 2017-08-21 MED ORDER — PHENYLEPHRINE HCL 10 MG/ML IJ SOLN
INTRAVENOUS | Status: DC | PRN
  Administered 2017-08-21: 25 ug/min via INTRAVENOUS

## 2017-08-21 MED ORDER — SODIUM CHLORIDE 0.9% FLUSH: 3.0000 mL | INTRAVENOUS | Status: DC | PRN

## 2017-08-21 MED ORDER — AMLODIPINE BESYLATE 5 MG PO TABS
5.0000 mg | ORAL_TABLET | Freq: Every day | ORAL | Status: DC
  Administered 2017-08-22: 5 mg via ORAL
  Filled 2017-08-21: qty 1

## 2017-08-21 MED ORDER — BUPIVACAINE LIPOSOME 1.3 % IJ SUSP
20.0000 mL | Freq: Once | INTRAMUSCULAR | Status: AC
  Administered 2017-08-21: 20 mL
  Filled 2017-08-21: qty 20

## 2017-08-21 MED ORDER — ACETAMINOPHEN 325 MG PO TABS: 650.0000 mg | ORAL_TABLET | ORAL | Status: DC | PRN

## 2017-08-21 MED ORDER — MENTHOL 3 MG MT LOZG: 1.0000 | LOZENGE | OROMUCOSAL | Status: DC | PRN

## 2017-08-21 MED ORDER — ONDANSETRON HCL 4 MG/2ML IJ SOLN
INTRAMUSCULAR | Status: AC
  Filled 2017-08-21: qty 2

## 2017-08-21 MED ORDER — ONDANSETRON HCL 4 MG PO TABS: 4.0000 mg | ORAL_TABLET | Freq: Four times a day (QID) | ORAL | Status: DC | PRN

## 2017-08-21 MED ORDER — CALCIUM CARBONATE ANTACID 500 MG PO CHEW: 1.0000 | CHEWABLE_TABLET | Freq: Every day | ORAL | Status: DC | PRN

## 2017-08-21 MED ORDER — OXYCODONE-ACETAMINOPHEN 5-325 MG PO TABS
1.0000 | ORAL_TABLET | ORAL | Status: DC | PRN
Start: 2017-08-21 — End: 2017-08-22
  Administered 2017-08-21 – 2017-08-22 (×3): 2 via ORAL
  Filled 2017-08-21 (×3): qty 2

## 2017-08-21 MED ORDER — MORPHINE SULFATE (PF) 4 MG/ML IV SOLN: 1.0000 mg | INTRAVENOUS | Status: DC | PRN

## 2017-08-21 MED ORDER — METOPROLOL TARTRATE 25 MG PO TABS
50.0000 mg | ORAL_TABLET | Freq: Two times a day (BID) | ORAL | Status: DC
  Administered 2017-08-21: 50 mg via ORAL
  Filled 2017-08-21: qty 2

## 2017-08-21 MED ORDER — THROMBIN 20000 UNITS EX SOLR
CUTANEOUS | Status: AC
  Filled 2017-08-21: qty 20000

## 2017-08-21 MED ORDER — ROCURONIUM BROMIDE 10 MG/ML (PF) SYRINGE
PREFILLED_SYRINGE | INTRAVENOUS | Status: AC
Start: 2017-08-21 — End: 2017-08-21
  Filled 2017-08-21: qty 5

## 2017-08-21 MED ORDER — ZOLPIDEM TARTRATE 5 MG PO TABS: 5.0000 mg | ORAL_TABLET | Freq: Every evening | ORAL | Status: DC | PRN

## 2017-08-21 MED ORDER — SCOPOLAMINE 1 MG/3DAYS TD PT72
MEDICATED_PATCH | TRANSDERMAL | Status: AC
  Filled 2017-08-21: qty 1

## 2017-08-21 MED ORDER — SUCCINYLCHOLINE CHLORIDE 200 MG/10ML IV SOSY
PREFILLED_SYRINGE | INTRAVENOUS | Status: AC
Start: 2017-08-21 — End: 2017-08-21
  Filled 2017-08-21: qty 10

## 2017-08-21 MED ORDER — PRAVASTATIN SODIUM 40 MG PO TABS
80.0000 mg | ORAL_TABLET | Freq: Every day | ORAL | Status: DC
  Administered 2017-08-21 – 2017-08-22 (×2): 80 mg via ORAL
  Filled 2017-08-21 (×2): qty 2

## 2017-08-21 MED ORDER — BUPIVACAINE-EPINEPHRINE 0.25% -1:200000 IJ SOLN
INTRAMUSCULAR | Status: DC | PRN
  Administered 2017-08-21: 20 mL

## 2017-08-21 MED ORDER — POVIDONE-IODINE 7.5 % EX SOLN: Freq: Once | CUTANEOUS | Status: DC

## 2017-08-21 MED ORDER — SODIUM CHLORIDE 0.9% FLUSH
3.0000 mL | Freq: Two times a day (BID) | INTRAVENOUS | Status: DC
  Administered 2017-08-21: 3 mL via INTRAVENOUS

## 2017-08-21 MED ORDER — OXYCODONE HCL 5 MG/5ML PO SOLN: 5.0000 mg | Freq: Once | ORAL | Status: DC | PRN

## 2017-08-21 MED ORDER — DIAZEPAM 5 MG PO TABS
5.0000 mg | ORAL_TABLET | Freq: Four times a day (QID) | ORAL | Status: DC | PRN
  Administered 2017-08-21: 5 mg via ORAL
  Filled 2017-08-21 (×2): qty 1

## 2017-08-21 MED ORDER — PANTOPRAZOLE SODIUM 40 MG PO TBEC
40.0000 mg | DELAYED_RELEASE_TABLET | Freq: Every day | ORAL | Status: DC
  Administered 2017-08-21: 40 mg via ORAL
  Filled 2017-08-21: qty 1

## 2017-08-21 MED ORDER — DEXAMETHASONE SODIUM PHOSPHATE 10 MG/ML IJ SOLN
INTRAMUSCULAR | Status: DC | PRN
  Administered 2017-08-21: 10 mg via INTRAVENOUS

## 2017-08-21 MED ORDER — SENNOSIDES-DOCUSATE SODIUM 8.6-50 MG PO TABS: 1.0000 | ORAL_TABLET | Freq: Every evening | ORAL | Status: DC | PRN

## 2017-08-21 MED ORDER — FENTANYL CITRATE (PF) 100 MCG/2ML IJ SOLN
INTRAMUSCULAR | Status: DC | PRN
  Administered 2017-08-21 (×5): 50 ug via INTRAVENOUS

## 2017-08-21 MED ORDER — PROPOFOL 500 MG/50ML IV EMUL
INTRAVENOUS | Status: DC | PRN
  Administered 2017-08-21: 50 ug/kg/min via INTRAVENOUS

## 2017-08-21 MED ORDER — EPHEDRINE SULFATE 50 MG/ML IJ SOLN
INTRAMUSCULAR | Status: DC | PRN
  Administered 2017-08-21: 10 mg via INTRAVENOUS

## 2017-08-21 MED ORDER — POTASSIUM CHLORIDE IN NACL 20-0.9 MEQ/L-% IV SOLN: INTRAVENOUS | Status: DC

## 2017-08-21 MED ORDER — ESOMEPRAZOLE MAGNESIUM 20 MG PO TBEC: 20.0000 mg | DELAYED_RELEASE_TABLET | Freq: Every day | ORAL | Status: DC

## 2017-08-21 MED ORDER — LIDOCAINE HCL (CARDIAC) 20 MG/ML IV SOLN
INTRAVENOUS | Status: AC
  Filled 2017-08-21: qty 5

## 2017-08-21 MED ORDER — ONDANSETRON HCL 4 MG/2ML IJ SOLN
INTRAMUSCULAR | Status: DC | PRN
  Administered 2017-08-21: 4 mg via INTRAVENOUS

## 2017-08-21 MED ORDER — SUCCINYLCHOLINE CHLORIDE 20 MG/ML IJ SOLN
INTRAMUSCULAR | Status: DC | PRN
  Administered 2017-08-21: 100 mg via INTRAVENOUS

## 2017-08-21 MED ORDER — BISACODYL 5 MG PO TBEC: 5.0000 mg | DELAYED_RELEASE_TABLET | Freq: Every day | ORAL | Status: DC | PRN

## 2017-08-21 MED ORDER — CEFAZOLIN SODIUM-DEXTROSE 2-4 GM/100ML-% IV SOLN
2.0000 g | INTRAVENOUS | Status: AC
  Administered 2017-08-21 (×2): 2 g via INTRAVENOUS
  Filled 2017-08-21: qty 100

## 2017-08-21 MED ORDER — ACETAMINOPHEN 650 MG RE SUPP: 650.0000 mg | RECTAL | Status: DC | PRN

## 2017-08-21 MED ORDER — ROCURONIUM BROMIDE 100 MG/10ML IV SOLN
INTRAVENOUS | Status: DC | PRN
  Administered 2017-08-21: 30 mg via INTRAVENOUS
  Administered 2017-08-21: 50 mg via INTRAVENOUS

## 2017-08-21 MED ORDER — FLEET ENEMA 7-19 GM/118ML RE ENEM: 1.0000 | ENEMA | Freq: Once | RECTAL | Status: DC | PRN

## 2017-08-21 MED ORDER — ONDANSETRON HCL 4 MG/2ML IJ SOLN: 4.0000 mg | Freq: Four times a day (QID) | INTRAMUSCULAR | Status: DC | PRN

## 2017-08-21 MED ORDER — ONDANSETRON HCL 4 MG/2ML IJ SOLN: 4.0000 mg | Freq: Once | INTRAMUSCULAR | Status: DC | PRN

## 2017-08-21 MED ORDER — LACTATED RINGERS IV SOLN
INTRAVENOUS | Status: DC | PRN
  Administered 2017-08-21 (×2): via INTRAVENOUS

## 2017-08-21 MED ORDER — SUGAMMADEX SODIUM 200 MG/2ML IV SOLN
INTRAVENOUS | Status: AC
  Filled 2017-08-21: qty 2

## 2017-08-21 MED ORDER — LIDOCAINE HCL (CARDIAC) 20 MG/ML IV SOLN
INTRAVENOUS | Status: DC | PRN
  Administered 2017-08-21: 100 mg via INTRAVENOUS

## 2017-08-21 MED ORDER — CEFAZOLIN SODIUM-DEXTROSE 2-4 GM/100ML-% IV SOLN
2.0000 g | Freq: Three times a day (TID) | INTRAVENOUS | Status: AC
  Administered 2017-08-21 – 2017-08-22 (×2): 2 g via INTRAVENOUS
  Filled 2017-08-21 (×2): qty 100

## 2017-08-21 SURGICAL SUPPLY — 90 items
APL SKNCLS STERI-STRIP NONHPOA (GAUZE/BANDAGES/DRESSINGS) ×1
BENZOIN TINCTURE PRP APPL 2/3 (GAUZE/BANDAGES/DRESSINGS) ×1 IMPLANT
BLADE CLIPPER SURG (BLADE) ×1 IMPLANT
BONE VIVIGEN FORMABLE 10CC (Bone Implant) ×2 IMPLANT
BUR PRESCISION 1.7 ELITE (BURR) IMPLANT
BUR ROUND PRECISION 4.0 (BURR) IMPLANT
BUR SABER RD CUTTING 3.0 (BURR) IMPLANT
CAGE CONCORDE LIFT 11X23 (Cage) ×1 IMPLANT
CARTRIDGE OIL MAESTRO DRILL (MISCELLANEOUS) ×2 IMPLANT
CLSR STERI-STRIP ANTIMIC 1/2X4 (GAUZE/BANDAGES/DRESSINGS) ×2 IMPLANT
CONT SPEC 4OZ CLIKSEAL STRL BL (MISCELLANEOUS) ×2 IMPLANT
COVER SURGICAL LIGHT HANDLE (MISCELLANEOUS) ×3 IMPLANT
DIFFUSER DRILL AIR PNEUMATIC (MISCELLANEOUS) ×3 IMPLANT
DRAIN CHANNEL 15F RND FF W/TCR (WOUND CARE) ×1 IMPLANT
DRAPE C-ARM 42X72 X-RAY (DRAPES) ×2 IMPLANT
DRAPE C-ARMOR (DRAPES) ×1 IMPLANT
DRAPE POUCH INSTRU U-SHP 10X18 (DRAPES) ×2 IMPLANT
DRAPE SURG 17X23 STRL (DRAPES) ×8 IMPLANT
DRSG MEPILEX BORDER 4X12 (GAUZE/BANDAGES/DRESSINGS) IMPLANT
DRSG MEPILEX BORDER 4X8 (GAUZE/BANDAGES/DRESSINGS) IMPLANT
DURAPREP 26ML APPLICATOR (WOUND CARE) ×2 IMPLANT
ELECT BLADE 4.0 EZ CLEAN MEGAD (MISCELLANEOUS) ×2
ELECT CAUTERY BLADE 6.4 (BLADE) ×5 IMPLANT
ELECT REM PT RETURN 9FT ADLT (ELECTROSURGICAL) ×4
ELECTRODE BLDE 4.0 EZ CLN MEGD (MISCELLANEOUS) ×1 IMPLANT
ELECTRODE REM PT RTRN 9FT ADLT (ELECTROSURGICAL) ×1 IMPLANT
EVACUATOR SILICONE 100CC (DRAIN) ×1 IMPLANT
FEE INTRAOP MONITOR IMPULS NCS (MISCELLANEOUS) IMPLANT
GAUZE SPONGE 4X4 12PLY STRL (GAUZE/BANDAGES/DRESSINGS) ×1 IMPLANT
GAUZE SPONGE 4X4 12PLY STRL LF (GAUZE/BANDAGES/DRESSINGS) ×1 IMPLANT
GAUZE SPONGE 4X4 16PLY XRAY LF (GAUZE/BANDAGES/DRESSINGS) ×2 IMPLANT
GLOVE BIO SURGEON STRL SZ7 (GLOVE) ×2 IMPLANT
GLOVE BIO SURGEON STRL SZ8 (GLOVE) ×4 IMPLANT
GLOVE BIOGEL PI IND STRL 6.5 (GLOVE) IMPLANT
GLOVE BIOGEL PI IND STRL 7.0 (GLOVE) ×1 IMPLANT
GLOVE BIOGEL PI IND STRL 8 (GLOVE) ×1 IMPLANT
GLOVE BIOGEL PI INDICATOR 6.5 (GLOVE) ×2
GLOVE BIOGEL PI INDICATOR 7.0 (GLOVE) ×1
GLOVE BIOGEL PI INDICATOR 8 (GLOVE) ×3
GOWN STRL REUS W/ TWL LRG LVL3 (GOWN DISPOSABLE) ×2 IMPLANT
GOWN STRL REUS W/ TWL XL LVL3 (GOWN DISPOSABLE) ×1 IMPLANT
GOWN STRL REUS W/TWL LRG LVL3 (GOWN DISPOSABLE) ×4
GOWN STRL REUS W/TWL XL LVL3 (GOWN DISPOSABLE) ×4
GRAFT BNE MATRIX VG FRMBL L 10 (Bone Implant) IMPLANT
INTRAOP MONITOR FEE IMPULS NCS (MISCELLANEOUS) ×1
INTRAOP MONITOR FEE IMPULSE (MISCELLANEOUS) ×1
IV CATH 14GX2 1/4 (CATHETERS) ×2 IMPLANT
KIT BASIN OR (CUSTOM PROCEDURE TRAY) ×2 IMPLANT
KIT POSITION SURG JACKSON T1 (MISCELLANEOUS) ×2 IMPLANT
KIT ROOM TURNOVER OR (KITS) ×2 IMPLANT
MARKER SKIN DUAL TIP RULER LAB (MISCELLANEOUS) ×2 IMPLANT
NDL HYPO 25GX1X1/2 BEV (NEEDLE) ×1 IMPLANT
NDL SPNL 18GX3.5 QUINCKE PK (NEEDLE) ×2 IMPLANT
NEEDLE 22X1 1/2 (OR ONLY) (NEEDLE) ×1 IMPLANT
NEEDLE HYPO 25GX1X1/2 BEV (NEEDLE) ×2 IMPLANT
NEEDLE SPNL 18GX3.5 QUINCKE PK (NEEDLE) ×4 IMPLANT
NS IRRIG 1000ML POUR BTL (IV SOLUTION) ×2 IMPLANT
OIL CARTRIDGE MAESTRO DRILL (MISCELLANEOUS) ×2
PACK LAMINECTOMY ORTHO (CUSTOM PROCEDURE TRAY) ×2 IMPLANT
PACK UNIVERSAL I (CUSTOM PROCEDURE TRAY) ×2 IMPLANT
PAD ARMBOARD 7.5X6 YLW CONV (MISCELLANEOUS) ×5 IMPLANT
PATTIES SURGICAL .5 X1 (DISPOSABLE) ×2 IMPLANT
PATTIES SURGICAL .5 X3 (DISPOSABLE) IMPLANT
PATTIES SURGICAL .5X1.5 (GAUZE/BANDAGES/DRESSINGS) ×2 IMPLANT
PATTIES SURGICAL .75X.75 (GAUZE/BANDAGES/DRESSINGS) ×1 IMPLANT
PROBE PEDICLE (MISCELLANEOUS) ×1 IMPLANT
ROD PRE BENT EXP 40MM (Rod) ×2 IMPLANT
SCREW SET SINGLE INNER (Screw) ×4 IMPLANT
SCREW VIPER CORT FIX 6X35 (Screw) ×4 IMPLANT
SPONGE INTESTINAL PEANUT (DISPOSABLE) ×1 IMPLANT
SPONGE SURGIFOAM ABS GEL 100 (HEMOSTASIS) ×1 IMPLANT
STRIP CLOSURE SKIN 1/2X4 (GAUZE/BANDAGES/DRESSINGS) IMPLANT
SURGIFLO W/THROMBIN 8M KIT (HEMOSTASIS) IMPLANT
SUT MNCRL AB 4-0 PS2 18 (SUTURE) ×2 IMPLANT
SUT VIC AB 0 CT1 18XCR BRD 8 (SUTURE) ×2 IMPLANT
SUT VIC AB 0 CT1 8-18 (SUTURE) ×4
SUT VIC AB 1 CT1 18XCR BRD 8 (SUTURE) ×2 IMPLANT
SUT VIC AB 1 CT1 8-18 (SUTURE) ×2
SUT VIC AB 2-0 CT2 18 VCP726D (SUTURE) ×2 IMPLANT
SYR 20CC LL (SYRINGE) ×2 IMPLANT
SYR BULB IRRIGATION 50ML (SYRINGE) ×2 IMPLANT
SYR CONTROL 10ML LL (SYRINGE) ×4 IMPLANT
SYR TB 1ML LUER SLIP (SYRINGE) ×2 IMPLANT
SYRINGE 20CC LL (MISCELLANEOUS) ×1 IMPLANT
TAPE CLOTH SURG 4X10 WHT LF (GAUZE/BANDAGES/DRESSINGS) ×1 IMPLANT
TOWEL OR 17X24 6PK STRL BLUE (TOWEL DISPOSABLE) ×2 IMPLANT
TOWEL OR 17X26 10 PK STRL BLUE (TOWEL DISPOSABLE) ×2 IMPLANT
TRAY FOLEY W/METER SILVER 16FR (SET/KITS/TRAYS/PACK) ×2 IMPLANT
WATER STERILE IRR 1000ML POUR (IV SOLUTION) ×2 IMPLANT
YANKAUER SUCT BULB TIP NO VENT (SUCTIONS) ×2 IMPLANT

## 2017-08-21 NOTE — Anesthesia Procedure Notes (Signed)
Procedure Name: Intubation Date/Time: 08/21/2017 9:39 AM Performed by: Scheryl Darter, CRNA Pre-anesthesia Checklist: Patient identified, Emergency Drugs available, Suction available and Patient being monitored Patient Re-evaluated:Patient Re-evaluated prior to induction Oxygen Delivery Method: Circle System Utilized Preoxygenation: Pre-oxygenation with 100% oxygen Induction Type: IV induction Ventilation: Mask ventilation without difficulty Laryngoscope Size: Miller and 3 Grade View: Grade III Tube type: Oral Tube size: 7.5 mm Number of attempts: 1 Airway Equipment and Method: Stylet and Oral airway (soft oral airway) Placement Confirmation: ETT inserted through vocal cords under direct vision,  positive ETCO2 and breath sounds checked- equal and bilateral Secured at: 23 cm Tube secured with: Tape Dental Injury: Teeth and Oropharynx as per pre-operative assessment  Difficulty Due To: Difficult Airway- due to large tongue and Difficult Airway- due to anterior larynx

## 2017-08-21 NOTE — Anesthesia Preprocedure Evaluation (Signed)
Anesthesia Evaluation  Patient identified by MRN, date of birth, ID band Patient awake    Reviewed: Allergy & Precautions, NPO status , Patient's Chart, lab work & pertinent test results  Airway Mallampati: II  TM Distance: >3 FB Neck ROM: Full    Dental  (+) Teeth Intact, Dental Advisory Given   Pulmonary former smoker,    breath sounds clear to auscultation       Cardiovascular hypertension,  Rhythm:Regular Rate:Normal     Neuro/Psych    GI/Hepatic   Endo/Other    Renal/GU      Musculoskeletal   Abdominal   Peds  Hematology   Anesthesia Other Findings   Reproductive/Obstetrics                             Anesthesia Physical Anesthesia Plan  ASA: III  Anesthesia Plan: General   Post-op Pain Management:    Induction: Intravenous  PONV Risk Score and Plan: 1 and Ondansetron and Dexamethasone  Airway Management Planned: Oral ETT  Additional Equipment:   Intra-op Plan:   Post-operative Plan: Extubation in OR  Informed Consent: I have reviewed the patients History and Physical, chart, labs and discussed the procedure including the risks, benefits and alternatives for the proposed anesthesia with the patient or authorized representative who has indicated his/her understanding and acceptance.   Dental advisory given  Plan Discussed with: CRNA and Anesthesiologist  Anesthesia Plan Comments:         Anesthesia Quick Evaluation

## 2017-08-21 NOTE — Evaluation (Signed)
Physical Therapy Evaluation Patient Details Name: Timothy Shaw MRN: [id number] DOB: Nov 20, 1955 Today's Date: 08/21/2017   History of Present Illness  Pt is a 62 y/o male s/p L4-5 PLIF. PMH includes HTN and anxiety.   Clinical Impression  Patient is s/p above surgery resulting in the deficits listed below (see PT Problem List). Pt very guarded during gait and slightly unsteady requiring min A. Educated about back precautions and walking program.  Patient will benefit from skilled PT to increase their independence and safety with mobility (while adhering to their precautions) to allow discharge to the venue listed below.     Follow Up Recommendations No PT follow up;Supervision for mobility/OOB    Equipment Recommendations  None recommended by PT(has access to all DME )    Recommendations for Other Services       Precautions / Restrictions Precautions Precautions: Back Precaution Booklet Issued: Yes (comment) Precaution Comments: Reviewed back precautions with pt.  Required Braces or Orthoses: Spinal Brace Spinal Brace: Thoracolumbosacral orthotic;Applied in sitting position Restrictions Weight Bearing Restrictions: No      Mobility  Bed Mobility Overal bed mobility: Needs Assistance Bed Mobility: Rolling;Sidelying to Sit;Sit to Sidelying Rolling: Min guard Sidelying to sit: Supervision     Sit to sidelying: Supervision General bed mobility comments: Min guard to supervision to ensure log roll technique. Increased time required secondary to pain. Verbal cues for sequencing.   Transfers Overall transfer level: Needs assistance Equipment used: None Transfers: Sit to/from Stand Sit to Stand: Min assist;From elevated surface         General transfer comment: Min A for steadying assist from elevated surface. Increased time required.   Ambulation/Gait Ambulation/Gait assistance: Min assist Ambulation Distance (Feet): 200 Feet Assistive device: 1 person hand held  assist(IV pole) Gait Pattern/deviations: Step-through pattern;Decreased stride length Gait velocity: Decreased  Gait velocity interpretation: Below normal speed for age/gender General Gait Details: Slow, very guarded gait. Slightly unsteady, however, no overt LOB noted. Educated about generalized walking program to perform at home.   Stairs            Wheelchair Mobility    Modified Rankin (Stroke Patients Only)       Balance Overall balance assessment: Needs assistance Sitting-balance support: No upper extremity supported;Feet supported Sitting balance-Leahy Scale: Good     Standing balance support: Bilateral upper extremity supported;During functional activity Standing balance-Leahy Scale: Poor Standing balance comment: Reliant on BUE support.                              Pertinent Vitals/Pain Pain Assessment: Faces Faces Pain Scale: Hurts even more Pain Location: back  Pain Descriptors / Indicators: Operative site guarding;Sore Pain Intervention(s): Limited activity within patient's tolerance;Monitored during session;Repositioned    Home Living Family/patient expects to be discharged to:: Private residence Living Arrangements: Spouse/significant other Available Help at Discharge: Family;Available 24 hours/day Type of Home: House Home Access: Stairs to enter Entrance Stairs-Rails: Psychiatric nurse of Steps: 4 Home Layout: Two level Home Equipment: Walker - 2 wheels;Bedside commode(Has access to from friend)      Prior Function Level of Independence: Independent with assistive device(s)         Comments: Uses walking stick for longer distances     Hand Dominance        Extremity/Trunk Assessment   Upper Extremity Assessment Upper Extremity Assessment: Defer to OT evaluation    Lower Extremity Assessment Lower Extremity Assessment: Generalized weakness;LLE  deficits/detail LLE Deficits / Details: Reports L knee  deficits at baseline     Cervical / Trunk Assessment Cervical / Trunk Assessment: Other exceptions Cervical / Trunk Exceptions: s/p PLIF   Communication   Communication: No difficulties  Cognition Arousal/Alertness: Awake/alert Behavior During Therapy: WFL for tasks assessed/performed Overall Cognitive Status: Within Functional Limits for tasks assessed                                        General Comments General comments (skin integrity, edema, etc.): Pt's wife, daughter, and mother present during session.     Exercises     Assessment/Plan    PT Assessment Patient needs continued PT services  PT Problem List Decreased strength;Decreased balance;Decreased mobility;Decreased knowledge of use of DME;Decreased knowledge of precautions;Pain       PT Treatment Interventions DME instruction;Gait training;Stair training;Functional mobility training;Therapeutic activities;Therapeutic exercise;Balance training;Neuromuscular re-education;Patient/family education    PT Goals (Current goals can be found in the Care Plan section)  Acute Rehab PT Goals Patient Stated Goal: to go home  PT Goal Formulation: With patient Time For Goal Achievement: 09/04/17 Potential to Achieve Goals: Good    Frequency Min 5X/week   Barriers to discharge        Co-evaluation               AM-PAC PT "6 Clicks" Daily Activity  Outcome Measure Difficulty turning over in bed (including adjusting bedclothes, sheets and blankets)?: A Little Difficulty moving from lying on back to sitting on the side of the bed? : Unable Difficulty sitting down on and standing up from a chair with arms (e.g., wheelchair, bedside commode, etc,.)?: Unable Help needed moving to and from a bed to chair (including a wheelchair)?: A Little Help needed walking in hospital room?: A Little Help needed climbing 3-5 steps with a railing? : A Lot 6 Click Score: 13    End of Session Equipment Utilized During  Treatment: Back brace;Gait belt Activity Tolerance: Patient tolerated treatment well Patient left: in bed;with call bell/phone within reach;with family/visitor present Nurse Communication: Mobility status PT Visit Diagnosis: Unsteadiness on feet (R26.81);Other abnormalities of gait and mobility (R26.89);Pain Pain - part of body: (back )    Time: 5726-2035 PT Time Calculation (min) (ACUTE ONLY): 33 min   Charges:   PT Evaluation $PT Eval Low Complexity: 1 Low PT Treatments $Gait Training: 8-22 mins   PT G Codes:        Leighton Ruff, PT, DPT  Acute Rehabilitation Services  Pager: [phone]   Rudean Hitt 08/21/2017, 6:12 PM

## 2017-08-21 NOTE — Anesthesia Postprocedure Evaluation (Signed)
Anesthesia Post Note  Patient: Timothy Shaw  Procedure(s) Performed: LUMBAR THREE-LUMBAR FIVE DECOMPRESSION AND LEFT SIDED LUMBAR FOUR-FIVE TRANSFORAMINAL LUMBAR INTERBODY FUSION WITH INSTURMENTATION AND ALLOGRAFT. (Left Spine Lumbar)     Patient location during evaluation: PACU Anesthesia Type: General Level of consciousness: awake and alert Pain management: pain level controlled Vital Signs Assessment: post-procedure vital signs reviewed and stable Respiratory status: spontaneous breathing, nonlabored ventilation, respiratory function stable and patient connected to nasal cannula oxygen Cardiovascular status: blood pressure returned to baseline and stable Postop Assessment: no apparent nausea or vomiting Anesthetic complications: no    Last Vitals:  Vitals:   08/21/17 1547 08/21/17 1605  BP:  131/72  Pulse:  65  Resp:  16  Temp: 36.6 C 36.4 C  SpO2:  100%    Last Pain:  Vitals:   08/21/17 1605  TempSrc: Axillary  PainSc:                  Timothy Shaw

## 2017-08-21 NOTE — Op Note (Signed)
MEDICAL RECORD NO.: [id number]  PHYSICIAN: Phylliss Bob, MD DATE OF BIRTH: [birth year]  DATE OF PROCEDURE: 08/21/2017  OPERATIVE REPORT   PREOPERATIVE DIAGNOSES: 1. Left -sided lumbar radiculopathy. 2. L4-5 stenosis. 3. SL4/5 spondylolisthesis  POSTOPERATIVE DIAGNOSES: 1. Left -sided lumbar radiculopathy. 2. L4-5 stenosis. 3. SL4/5 spondylolisthesis  PROCEDURES: 1. L4-5 decompression. 2. Left-sided L4-5 transforaminal lumbar interbody fusion. 3. Right-sided L4-5 posterolateral fusion. 4. Insertion of interbody device x1 (expandable lordotic intervertebral spacer, expanded up to 15 mm). 5. Placement of posterior instrumentation L4, L5. 6. Use of local autograft. 7. Use of morselized allograft - ViviGen. 8. Intraoperative use of fluoroscopy.  SURGEON: Phylliss Bob, MD.  ASSISTANTPricilla Holm, PA-C.  ANESTHESIA: General endotracheal anesthesia.  COMPLICATIONS: None.  DISPOSITION: Stable.  ESTIMATED BLOOD LOSS: 200cc  INDICATIONS FOR SURGERY: Briefly,  Mr. Gloster is a pleasant 62 year old patient who did present to me with severe and ongoing pain in the left leg.I did feel that the symptoms  were secondary to the findings noted above.  The patient failed conservative care and did wish to proceed with the procedure  noted above.  Of note, the patient did have very minimal narrowing at the L3-4 level, which I ultimately did not feel corresponded to his symptoms.  I did elect to proceed with an L4-5 decompression and fusion, given his stenosis and instability at the L4-5 level.  OPERATIVE DETAILS: On 08/21/2017, the patient was brought to surgery and general endotracheal anesthesia was administered. The patient was placed prone on a well-padded flat Jackson bed with a spinal frame. Antibiotics were given and a time-out procedure was performed. The back was prepped and draped in the usual fashion. A  midline incision was made overlying the L4-5 intervertebral space, in line with  the patient's previous incision. The fascia was incised at the midline. The paraspinal musculature was bluntly swept laterally. Anatomic landmarks for the pedicles were exposed. Using fluoroscopy, I did cannulate the L4 and L5 pedicles bilaterally, using a medial to lateral cortical trajectory technique. On the right side, the posterolateral gutter and right facet joint at L4-5 was decorticated and 6 mm screws of the appropriate length were placed  and a 40-mm rod was placed and distraction was applied across the rod on the right side. On the left side, the cannulated pedicle holes were filled with bone wax. I then proceeded with the decompressive aspect of the procedure.  A bilateral L4-5 partial facetectomy was performed, thereby entirely decompressing the right and left lateral recesses and neuroforamina. I was able to expose the exiting left L4 nerve, which again, was entirely decompressed. With the assistant holding medial retraction of the traversing left L5 nerve, I did perform an annulotomy at the posterolateral aspect of the L4-5 intervertebral space. I then used a series of curettes and pituitary rongeurs to perform a thorough and complete intervertebral diskectomy. The intervertebral space was then liberally packed with autograft as well as allograft in the form of ViviGen, as was the appropriate-sized intervertebral spacer. The spacer was then tamped into position in the usual fashion. The spacer was then expanded up to 15 mm, and was then additionally filled with allograft. I was very pleased with the press-fit of the spacer. I then placed 6 mm screws on the left at L4 and L5. A 40-mm rod was then placed and caps were placed. The distraction was then released on the contralateral right side. All 4 caps were then locked. The wound was copiously irrigated with a total of approximately  3  L prior to placing the bone graft. Additional autograft and allograft were then packed into the posterolateral gutter on the right side to help aid in the success of the fusion. The wound was  explored for any undue bleeding and there was no substantial bleeding encountered. Gel-Foam was placed over the laminectomy site. The wound was then closed in layers using #1 Vicryl followed by 2-0 Vicryl, followed by 4-0 Monocryl. Benzoin and Steri-Strips were applied followed by sterile dressing.  Of note, did use neurologic monitoring throughout the surgery.  There is no abnormal EMG activity encountered throughout the entire procedure.  All bleeding was controlled at the end of the procedure.  There was no extravasation of cerebral spinal fluid noted throughout the entire surgery.  Of note, Pricilla Holm was my assistant throughout surgery, and did aid in retraction, suctioning, and closure throughout the surgery.    Phylliss Bob, MD

## 2017-08-21 NOTE — H&P (Signed)
PREOPERATIVE H&P  Chief Complaint: Left leg pain  HPI: Timothy Shaw is a 62 y.o. male who presents with ongoing pain in the left leg  MRI reveals stenosis at L4/5 and L3/4 with a slip noted at L4/5  Patient has failed multiple forms of conservative care and continues to have pain (see office notes for additional details regarding the patient's full course of treatment)  Past Medical History:  Diagnosis Date  . Anxiety   . Anxiety disorder   . Arteriosclerotic cardiovascular disease (ASCVD)    with angina: 09/2009-100% proximal RCA; 50% proximal&60% mid LAD; 30-50% mid-Cx, normal EF  . Arthritis    Lyme disease in 10/2009 treated with antibiotics  . Chronic headaches   . Gastroesophageal reflux disease   . Hyperlipidemia    Total cholesterol-136, triglycerides of 89, HDL 48 and LDL of 70 in 03/2010  . Hypertension    Left ventricular hypertrophy  . PONV (postoperative nausea and vomiting)   . Tobacco abuse, in remission    20 pack years; mild airway obstruction on PFTs in 2010   Past Surgical History:  Procedure Laterality Date  . APPENDECTOMY  1970  . COLONOSCOPY  2008  . HERNIA REPAIR    . inguinal hernia repair    . JOINT REPLACEMENT     torn   . KNEE ARTHROSCOPY W/ MENISCAL REPAIR  2006, 2012  . NASAL SINUS SURGERY    . TONSILLECTOMY  1962  . UMBILICAL HERNIA REPAIR  2003   Social History   Socioeconomic History  . Marital status: Married    Spouse name: None  . Number of children: 1  . Years of education: None  . Highest education level: None  Social Needs  . Financial resource strain: None  . Food insecurity - worry: None  . Food insecurity - inability: None  . Transportation needs - medical: None  . Transportation needs - non-medical: None  Occupational History  . Occupation: Air traffic controller  Tobacco Use  . Smoking status: Former Smoker    Packs/day: 1.00    Years: 20.00    Pack years: 20.00    Types: Cigarettes    Start date:  06/29/1971    Last attempt to quit: 06/04/1988    Years since quitting: 29.2  . Smokeless tobacco: Never Used  Substance and Sexual Activity  . Alcohol use: Yes    Alcohol/week: 0.5 oz    Types: 1 Standard drinks or equivalent per week  . Drug use: No  . Sexual activity: None  Other Topics Concern  . None  Social History Narrative  . None   Family History  Problem Relation Age of Onset  . Heart failure Unknown        conduction system disease requiring pacemaker  . Lupus Unknown        Sibling  . Colon cancer Unknown        Sibling  . Colon cancer Sister   . Breast cancer Sister   . Esophageal cancer Neg Hx   . Stomach cancer Neg Hx   . Rectal cancer Neg Hx    No Known Allergies Prior to Admission medications   Medication Sig Start Date End Date Taking? Authorizing Provider  amLODipine (NORVASC) 5 MG tablet take 1 tablet by mouth once daily 01/28/17  Yes Herminio Commons, MD  aspirin 325 MG tablet Take 325 mg by mouth 2 (two) times a week.   Yes [provider]  Esomeprazole Magnesium 20  MG TBEC Take 20 mg by mouth daily before breakfast. Over the counter   Yes [provider]  Krill Oil 350 MG CAPS Take 1 capsule by mouth daily.   Yes [provider]  meloxicam (MOBIC) 15 MG tablet Take 15 mg by mouth daily.  03/07/16  Yes [provider]  metoprolol tartrate (LOPRESSOR) 50 MG tablet TAKE 1 TABLET BY MOUTH TWICE DAILY 07/24/17  Yes Herminio Commons, MD  pravastatin (PRAVACHOL) 80 MG tablet Take 1 tablet (80 mg total) by mouth daily. 05/30/17  Yes Herminio Commons, MD  calcium carbonate (TUMS - DOSED IN MG ELEMENTAL CALCIUM) 500 MG chewable tablet Chew 1 tablet by mouth daily as needed for heartburn.     [provider]     All other systems have been reviewed and were otherwise negative with the exception of those mentioned in the HPI and as above.  Physical Exam: Vitals:   08/21/17 0655  BP: (!) 144/85  Pulse: (!)  52  Resp: 18  Temp: 98.4 F (36.9 C)  SpO2: 98%    Body mass index is 28.76 kg/m.  General: Alert, no acute distress Cardiovascular: No pedal edema Respiratory: No cyanosis, no use of accessory musculature Skin: No lesions in the area of chief complaint Neurologic: Sensation intact distally Psychiatric: Patient is competent for consent with normal mood and affect Lymphatic: No axillary or cervical lymphadenopathy  MUSCULOSKELETAL: + SLR on the left  Assessment/Plan: Left Leg Pain Plan for Procedure(s): L3-L5 DECOMPRESSION AND LEFT SIDED LUMBAR 4-5 TRANSFORAMINAL LUMBAR INTERBODY FUSION WITH INSTURMENTATION AND ALLOGRAFT.   Sinclair Ship, MD 08/21/2017 8:00 AM

## 2017-08-21 NOTE — Transfer of Care (Signed)
Immediate Anesthesia Transfer of Care Note  Patient: Timothy Shaw  Procedure(s) Performed: LUMBAR THREE-LUMBAR FIVE DECOMPRESSION AND LEFT SIDED LUMBAR FOUR-FIVE TRANSFORAMINAL LUMBAR INTERBODY FUSION WITH INSTURMENTATION AND ALLOGRAFT. (Left Spine Lumbar)  Patient Location: PACU  Anesthesia Type:General  Level of Consciousness: awake, alert , oriented and sedated  Airway & Oxygen Therapy: Patient Spontanous Breathing and Patient connected to face mask oxygen  Post-op Assessment: Report given to RN, Post -op Vital signs reviewed and stable and Patient moving all extremities  Post vital signs: Reviewed and stable  Last Vitals:  Vitals:   08/21/17 0655 08/21/17 1417  BP: (!) 144/85   Pulse: (!) 52   Resp: 18   Temp: 36.9 C (!) (P) 36.2 C  SpO2: 98%     Last Pain:  Vitals:   08/21/17 0704  TempSrc:   PainSc: 3       Patients Stated Pain Goal: 2 (29/57/47 3403)  Complications: No apparent anesthesia complications

## 2017-08-22 MED FILL — Thrombin For Soln 20000 Unit: CUTANEOUS | Qty: 1 | Status: AC

## 2017-08-22 NOTE — Progress Notes (Signed)
    Patient doing well  Patient denies leg pain Has been ambulating   Physical Exam: Vitals:   08/22/17 0000 08/22/17 0400  BP: 121/68 138/81  Pulse: 74 65  Resp: 20 18  Temp: 98.7 F (37.1 C) 98 F (36.7 C)  SpO2: 99% 98%    Dressing in place NVI  POD #1 s/p L4/5 decompression and fusion, doing well  - up with PT/OT, encourage ambulation - Percocet for pain, Valium for muscle spasms - likely d/c home today with f/u in 2 weeks

## 2017-08-22 NOTE — Progress Notes (Signed)
Patient alert and oriented, mae's well, voiding adequate amount of urine, swallowing without difficulty, no c/o pain at time of discharge. Patient discharged home with family. Script and discharged instructions given to patient. Patient and family stated understanding of instructions given. Patient has an appointment with Dr. Dumonski 

## 2017-08-22 NOTE — Progress Notes (Addendum)
Physical Therapy Treatment and Discharge Patient Details Name: Timothy Shaw MRN: [id number] DOB: [birth year] Today's Date: 08/22/2017    History of Present Illness Pt is a 62 y/o male s/p L4-5 PLIF. PMH includes HTN and anxiety.     PT Comments    Pt progressing well towards physical therapy goals. Was able to perform transfers and ambulation with gross superivion for safety and no AD. Pt was educated on stair negotiation, car transfer, brace application/wearing schedule, and general safety with activity progression. Will sign off at this time. If needs change, please reconsult.   Follow Up Recommendations  No PT follow up;Supervision for mobility/OOB     Equipment Recommendations  None recommended by PT(has access to all DME )    Recommendations for Other Services       Precautions / Restrictions Precautions Precautions: Back Precaution Booklet Issued: Yes (comment) Precaution Comments: Reviewed back precautions with pt.  Required Braces or Orthoses: Spinal Brace Spinal Brace: Thoracolumbosacral orthotic;Applied in sitting position Restrictions Weight Bearing Restrictions: No    Mobility  Bed Mobility Overal bed mobility: Needs Assistance Bed Mobility: Rolling;Sidelying to Sit Rolling: Modified independent (Device/Increase time) Sidelying to sit: Modified independent (Device/Increase time)       General bed mobility comments: Pt was able to demonstrate good log roll technique. Light use of rails but pt was educated on no use and feel he will be able to complete well at home.   Transfers Overall transfer level: Needs assistance Equipment used: None Transfers: Sit to/from Stand Sit to Stand: Min guard         General transfer comment: Min A for steadying assist from elevated surface. Increased time required.   Ambulation/Gait Ambulation/Gait assistance: Supervision Ambulation Distance (Feet): 300 Feet Assistive device: None Gait Pattern/deviations:  Step-through pattern;Decreased stride length;Trunk flexed Gait velocity: Decreased  Gait velocity interpretation: Below normal speed for age/gender General Gait Details: Slow but generally steady gait. No overt LOB noted.    Stairs Stairs: Yes   Stair Management: One rail Right;Step to pattern;Forwards Number of Stairs: 10 General stair comments: VC's for sequencing and general safety.   Wheelchair Mobility    Modified Rankin (Stroke Patients Only)       Balance Overall balance assessment: Needs assistance Sitting-balance support: No upper extremity supported;Feet supported Sitting balance-Leahy Scale: Good     Standing balance support: Bilateral upper extremity supported;During functional activity Standing balance-Leahy Scale: Poor Standing balance comment: Reliant on BUE support.                             Cognition Arousal/Alertness: Awake/alert Behavior During Therapy: WFL for tasks assessed/performed Overall Cognitive Status: Within Functional Limits for tasks assessed                                        Exercises      General Comments        Pertinent Vitals/Pain Pain Assessment: Faces Pain Score: 5  Faces Pain Scale: Hurts even more Pain Location: back  Pain Descriptors / Indicators: Operative site guarding;Sore Pain Intervention(s): Monitored during session    Home Living Family/patient expects to be discharged to:: Private residence Living Arrangements: Spouse/significant other Available Help at Discharge: Family;Available 24 hours/day Type of Home: House Home Access: Stairs to enter Entrance Stairs-Rails: Right;Left Home Layout: Two level Home Equipment: Environmental consultant - 2 wheels;Bedside commode  Prior Function Level of Independence: Independent with assistive device(s)      Comments: Uses walking stick for longer distances   PT Goals (current goals can now be found in the care plan section) Acute Rehab PT  Goals Patient Stated Goal: to go home  PT Goal Formulation: With patient Time For Goal Achievement: 09/04/17 Potential to Achieve Goals: Good Progress towards PT goals: Progressing toward goals    Frequency    Min 5X/week      PT Plan Current plan remains appropriate    Co-evaluation              AM-PAC PT "6 Clicks" Daily Activity  Outcome Measure  Difficulty turning over in bed (including adjusting bedclothes, sheets and blankets)?: None Difficulty moving from lying on back to sitting on the side of the bed? : None Difficulty sitting down on and standing up from a chair with arms (e.g., wheelchair, bedside commode, etc,.)?: A Little Help needed moving to and from a bed to chair (including a wheelchair)?: A Little Help needed walking in hospital room?: A Little Help needed climbing 3-5 steps with a railing? : A Little 6 Click Score: 20    End of Session Equipment Utilized During Treatment: Back brace;Gait belt Activity Tolerance: Patient tolerated treatment well Patient left: in bed;with call bell/phone within reach;with family/visitor present Nurse Communication: Mobility status PT Visit Diagnosis: Unsteadiness on feet (R26.81);Other abnormalities of gait and mobility (R26.89);Pain Pain - part of body: (back )     Time: 1287-8676 PT Time Calculation (min) (ACUTE ONLY): 22 min  Charges:  $Gait Training: 8-22 mins                    G Codes:       Rolinda Roan, PT, DPT Acute Rehabilitation Services Pager: [place] 08/22/2017, 10:14 AM

## 2017-08-22 NOTE — Evaluation (Signed)
Occupational Therapy Evaluation Patient Details Name: Timothy Shaw MRN: [id number] DOB: 25-Nov-1955 Today's Date: 08/22/2017    History of Present Illness Pt is a 62 y/o male s/p L4-5 PLIF. PMH includes HTN and anxiety.    Clinical Impression   Pt admitted with the above diagnoses and presents with below problem list. PTA pt was mostly independent with ADLs, used walking stick for longer distances. Pt is currently min guard with LB ADLs and functional mobility. Educated on compensatory strategies for ADLs to maintain back precautions.    Follow Up Recommendations  No OT follow up;Supervision - Intermittent    Equipment Recommendations  None recommended by OT    Recommendations for Other Services       Precautions / Restrictions Precautions Precautions: Back Precaution Comments: Reviewed back precautions with pt.  Required Braces or Orthoses: Spinal Brace Spinal Brace: Thoracolumbosacral orthotic;Applied in sitting position Restrictions Weight Bearing Restrictions: No      Mobility Bed Mobility               General bed mobility comments: up in chair  Transfers Overall transfer level: Needs assistance Equipment used: None Transfers: Sit to/from Stand Sit to Stand: Min guard              Balance Overall balance assessment: Needs assistance Sitting-balance support: No upper extremity supported;Feet supported Sitting balance-Leahy Scale: Good     Standing balance support: Bilateral upper extremity supported;During functional activity Standing balance-Leahy Scale: Poor Standing balance comment: Reliant on BUE support.                            ADL either performed or assessed with clinical judgement   ADL Overall ADL's : Needs assistance/impaired Eating/Feeding: Set up;Sitting   Grooming: Min guard;Standing   Upper Body Bathing: Set up;Sitting   Lower Body Bathing: Min guard;Sit to/from stand   Upper Body Dressing : Set up;Sitting    Lower Body Dressing: Min guard;Sit to/from stand   Toilet Transfer: Min guard;Ambulation   Toileting- Clothing Manipulation and Hygiene: Min guard;Sit to/from stand   Tub/ Shower Transfer: Tub transfer;Min guard;Ambulation Tub/Shower Transfer Details (indicate cue type and reason): simulated in room Functional mobility during ADLs: Min guard General ADL Comments: Pt completed simulated tub transfer in room. Just finished walking with PT. ADL education provided.      Vision         Perception     Praxis      Pertinent Vitals/Pain Pain Assessment: 0-10 Pain Score: 5  Pain Location: back  Pain Descriptors / Indicators: Operative site guarding;Sore Pain Intervention(s): Monitored during session;Repositioned     Hand Dominance     Extremity/Trunk Assessment Upper Extremity Assessment Upper Extremity Assessment: Overall WFL for tasks assessed   Lower Extremity Assessment Lower Extremity Assessment: Defer to PT evaluation   Cervical / Trunk Assessment Cervical / Trunk Assessment: Other exceptions Cervical / Trunk Exceptions: s/p PLIF    Communication Communication Communication: No difficulties   Cognition Arousal/Alertness: Awake/alert Behavior During Therapy: WFL for tasks assessed/performed Overall Cognitive Status: Within Functional Limits for tasks assessed                                     General Comments       Exercises     Shoulder Instructions      Home Living Family/patient expects to be discharged  to:: Private residence Living Arrangements: Spouse/significant other Available Help at Discharge: Family;Available 24 hours/day Type of Home: House Home Access: Stairs to enter [employer] of Steps: 4 Entrance Stairs-Rails: Right;Left Home Layout: Two level Alternate Level Stairs-Number of Steps: flight  Alternate Level Stairs-Rails: Right Bathroom Shower/Tub: Teacher, early years/pre: Standard     Home  Equipment: Environmental consultant - 2 wheels;Bedside commode          Prior Functioning/Environment Level of Independence: Independent with assistive device(s)        Comments: Uses walking stick for longer distances        OT Problem List: Impaired balance (sitting and/or standing);Decreased knowledge of use of DME or AE;Decreased knowledge of precautions;Pain      OT Treatment/Interventions:      OT Goals(Current goals can be found in the care plan section) Acute Rehab OT Goals Patient Stated Goal: to go home   OT Frequency:     Barriers to D/C:            Co-evaluation              AM-PAC PT "6 Clicks" Daily Activity     Outcome Measure Help from another person eating meals?: None Help from another person taking care of personal grooming?: None Help from another person toileting, which includes using toliet, bedpan, or urinal?: None Help from another person bathing (including washing, rinsing, drying)?: A Little Help from another person to put on and taking off regular upper body clothing?: None Help from another person to put on and taking off regular lower body clothing?: A Little 6 Click Score: 22   End of Session Equipment Utilized During Treatment: Back brace  Activity Tolerance: Patient tolerated treatment well Patient left: in chair;with call bell/phone within reach  OT Visit Diagnosis: Unsteadiness on feet (R26.81);Pain                Time: 5784-6962 OT Time Calculation (min): 16 min Charges:  OT General Charges $OT Visit: 1 Visit OT Evaluation $OT Eval Low Complexity: 1 Low G-Codes:       Hortencia Pilar 08/22/2017, 9:58 AM

## 2017-08-27 MED FILL — Sodium Chloride IV Soln 0.9%: INTRAVENOUS | Qty: 1000 | Status: AC

## 2017-08-27 MED FILL — Heparin Sodium (Porcine) Inj 1000 Unit/ML: INTRAMUSCULAR | Qty: 30 | Status: AC

## 2017-08-29 NOTE — Discharge Summary (Signed)
Patient ID: Timothy Shaw MRN: [id number] DOB/AGE: December 03, 1955 62 y.o.  Admit date: 08/21/2017 Discharge date: 08/22/2017  Admission Diagnoses:  Active Problems:   Radiculopathy   Discharge Diagnoses:  Same  Past Medical History:  Diagnosis Date  . Anxiety   . Anxiety disorder   . Arteriosclerotic cardiovascular disease (ASCVD)    with angina: 09/2009-100% proximal RCA; 50% proximal&60% mid LAD; 30-50% mid-Cx, normal EF  . Arthritis    Lyme disease in 10/2009 treated with antibiotics  . Chronic headaches   . Gastroesophageal reflux disease   . Hyperlipidemia    Total cholesterol-136, triglycerides of 89, HDL 48 and LDL of 70 in 03/2010  . Hypertension    Left ventricular hypertrophy  . PONV (postoperative nausea and vomiting)   . Tobacco abuse, in remission    20 pack years; mild airway obstruction on PFTs in 2010    Surgeries: Procedure(s): LUMBAR THREE-LUMBAR FIVE DECOMPRESSION AND LEFT SIDED LUMBAR FOUR-FIVE TRANSFORAMINAL LUMBAR INTERBODY FUSION WITH INSTURMENTATION AND ALLOGRAFT. on 08/21/2017   Consultants:  None  Discharged Condition: Improved  Hospital Course: STILES MAXCY is an 62 y.o. male who was admitted 08/21/2017 for operative treatment of radiculoapthy. Patient has severe unremitting pain that affects sleep, daily activities, and work/hobbies. After pre-op clearance the patient was taken to the operating room on 08/21/2017 and underwent  Procedure(s): [place] AND LEFT SIDED LUMBAR FOUR-FIVE TRANSFORAMINAL LUMBAR INTERBODY FUSION WITH INSTURMENTATION AND ALLOGRAFT.Marland Kitchen    Patient was given perioperative antibiotics:  Anti-infectives (From admission, onward)   Start     Dose/Rate Route Frequency Ordered Stop   08/21/17 2130  ceFAZolin (ANCEF) IVPB 2g/100 mL premix     2 g 200 mL/hr over 30 Minutes Intravenous Every 8 hours 08/21/17 1536 08/22/17 0424   08/21/17 0655  ceFAZolin (ANCEF) IVPB 2g/100 mL premix     2 g 200 mL/hr  over 30 Minutes Intravenous On call to O.R. 08/21/17 2297 08/21/17 1326       Patient was given sequential compression devices, early ambulation to prevent DVT.  Patient benefited maximally from hospital stay and there were no complications.    Recent vital signs: BP 116/64 (BP Location: Left Arm)   Pulse 63   Temp 98.3 F (36.8 C) (Oral)   Resp 18   Ht 6\' 2"  (1.88 m)   Wt 101.6 kg (224 lb)   SpO2 97%   BMI 28.76 kg/m    Discharge Medications:   Allergies as of 08/22/2017   No Known Allergies     Medication List    TAKE these medications   amLODipine 5 MG tablet Commonly known as:  NORVASC take 1 tablet by mouth once daily   calcium carbonate 500 MG chewable tablet Commonly known as:  TUMS - dosed in mg elemental calcium Chew 1 tablet by mouth daily as needed for heartburn.   Esomeprazole Magnesium 20 MG Tbec Take 20 mg by mouth daily before breakfast. Over the counter   metoprolol tartrate 50 MG tablet Commonly known as:  LOPRESSOR TAKE 1 TABLET BY MOUTH TWICE DAILY   pravastatin 80 MG tablet Commonly known as:  PRAVACHOL Take 1 tablet (80 mg total) by mouth daily.       Diagnostic Studies: Dg Chest 2 View  Result Date: 08/16/2017 CLINICAL DATA:  Lumbar spine surgery. EXAM: CHEST - 2 VIEW COMPARISON:  06/08/2008. FINDINGS: Mediastinum and hilar structures are normal. Low lung volumes with mild bibasilar atelectasis. Apical pleural thickening consistent with scarring.  No pleural effusion or pneumothorax. Heart size normal. Degenerative changes thoracic spine. IMPRESSION: Low lung volumes with mild bibasilar atelectasis. Electronically Signed   By: Marcello Moores  Register   On: 08/16/2017 07:47   Dg Lumbar Spine 2-3 Views  Result Date: 08/21/2017 CLINICAL DATA:  L4-L5 TLIF EXAM: DG C-ARM 61-120 MIN; LUMBAR SPINE - 2-3 VIEW COMPARISON:  Lumbar spine radiograph 08/21/2017 FINDINGS: Two fluoroscopic images are provided demonstrating TLIF at L4-L5. IMPRESSION: Intraoperative  fluoroscopy. Electronically Signed   By: Ulyses Jarred M.D.   On: 08/21/2017 14:18   Dg Lumbar Spine 1 View  Result Date: 08/21/2017 CLINICAL DATA:  Patient for surgery from L3-L5 by history EXAM: LUMBAR SPINE - 1 VIEW COMPARISON:  MR lumbar spine of 07/15/2017 FINDINGS: A portable cross-table lateral femoral operating room labeled 1 shows 2 needles positioned posteriorly. The more cephalad needlel is directed toward the spinous process of L3 with the more caudal needle directed toward the spinous process of L5. IMPRESSION: Needles localization position near spinous processes of L3 and L5 as noted above. Electronically Signed   By: Ivar Drape M.D.   On: 08/21/2017 10:12   Dg C-arm 1-60 Min  Result Date: 08/21/2017 CLINICAL DATA:  L4-L5 TLIF EXAM: DG C-ARM 61-120 MIN; LUMBAR SPINE - 2-3 VIEW COMPARISON:  Lumbar spine radiograph 08/21/2017 FINDINGS: Two fluoroscopic images are provided demonstrating TLIF at L4-L5. IMPRESSION: Intraoperative fluoroscopy. Electronically Signed   By: Ulyses Jarred M.D.   On: 08/21/2017 14:18   Dg C-arm 1-60 Min  Result Date: 08/21/2017 CLINICAL DATA:  L4-L5 TLIF EXAM: DG C-ARM 61-120 MIN; LUMBAR SPINE - 2-3 VIEW COMPARISON:  Lumbar spine radiograph 08/21/2017 FINDINGS: Two fluoroscopic images are provided demonstrating TLIF at L4-L5. IMPRESSION: Intraoperative fluoroscopy. Electronically Signed   By: Ulyses Jarred M.D.   On: 08/21/2017 14:18   Dg C-arm 1-60 Min  Result Date: 08/21/2017 CLINICAL DATA:  L4-L5 TLIF EXAM: DG C-ARM 61-120 MIN; LUMBAR SPINE - 2-3 VIEW COMPARISON:  Lumbar spine radiograph 08/21/2017 FINDINGS: Two fluoroscopic images are provided demonstrating TLIF at L4-L5. IMPRESSION: Intraoperative fluoroscopy. Electronically Signed   By: Ulyses Jarred M.D.   On: 08/21/2017 14:18    Disposition:    POD #1 s/p L4/5 decompression and fusion, doing well  - Percocet for pain, Valium for muscle spasms -Written scripts for pain signed and in chart -D/C  instructions sheet printed and in chart -D/C today  -F/U in office 2 weeks   Signed: Justice Britain 08/29/2017, 9:12 AM

## 2017-09-01 ENCOUNTER — Other Ambulatory Visit: Payer: Self-pay | Admitting: Cardiovascular Disease

## 2017-09-04 DIAGNOSIS — Z9889 Other specified postprocedural states: Secondary | ICD-10-CM | POA: Diagnosis not present

## 2017-09-20 ENCOUNTER — Other Ambulatory Visit: Payer: Self-pay | Admitting: Cardiovascular Disease

## 2017-09-20 DIAGNOSIS — I2510 Atherosclerotic heart disease of native coronary artery without angina pectoris: Secondary | ICD-10-CM

## 2017-09-20 DIAGNOSIS — I10 Essential (primary) hypertension: Secondary | ICD-10-CM

## 2017-10-04 ENCOUNTER — Ambulatory Visit
Admission: RE | Admit: 2017-10-04 | Discharge: 2017-10-04 | Disposition: A | Discharge status: Home / Self Care | Payer: BLUE CROSS/BLUE SHIELD | Source: Ambulatory Visit | Attending: Orthopedic Surgery | Admitting: Orthopedic Surgery

## 2017-10-04 ENCOUNTER — Other Ambulatory Visit: Payer: Self-pay | Admitting: Orthopedic Surgery

## 2017-10-04 DIAGNOSIS — M79605 Pain in left leg: Secondary | ICD-10-CM

## 2017-10-04 DIAGNOSIS — R609 Edema, unspecified: Secondary | ICD-10-CM

## 2017-10-04 DIAGNOSIS — I82401 Acute embolism and thrombosis of unspecified deep veins of right lower extremity: Secondary | ICD-10-CM | POA: Diagnosis not present

## 2017-10-04 DIAGNOSIS — M5416 Radiculopathy, lumbar region: Secondary | ICD-10-CM | POA: Diagnosis not present

## 2017-11-04 ENCOUNTER — Other Ambulatory Visit (HOSPITAL_COMMUNITY): Payer: Self-pay | Admitting: Pulmonary Disease

## 2017-11-04 DIAGNOSIS — I82402 Acute embolism and thrombosis of unspecified deep veins of left lower extremity: Secondary | ICD-10-CM | POA: Diagnosis not present

## 2017-11-04 DIAGNOSIS — M545 Low back pain: Secondary | ICD-10-CM | POA: Diagnosis not present

## 2017-11-04 DIAGNOSIS — M159 Polyosteoarthritis, unspecified: Secondary | ICD-10-CM | POA: Diagnosis not present

## 2017-11-04 DIAGNOSIS — I10 Essential (primary) hypertension: Secondary | ICD-10-CM | POA: Diagnosis not present

## 2017-11-04 DIAGNOSIS — R2242 Localized swelling, mass and lump, left lower limb: Secondary | ICD-10-CM

## 2017-11-05 ENCOUNTER — Ambulatory Visit (HOSPITAL_COMMUNITY)
Admission: RE | Admit: 2017-11-05 | Discharge: 2017-11-05 | Disposition: A | Discharge status: Home / Self Care | Payer: BLUE CROSS/BLUE SHIELD | Source: Ambulatory Visit | Attending: Pulmonary Disease | Admitting: Pulmonary Disease

## 2017-11-05 DIAGNOSIS — Z86718 Personal history of other venous thrombosis and embolism: Secondary | ICD-10-CM | POA: Insufficient documentation

## 2017-11-05 DIAGNOSIS — I82412 Acute embolism and thrombosis of left femoral vein: Secondary | ICD-10-CM | POA: Insufficient documentation

## 2017-11-05 DIAGNOSIS — R2242 Localized swelling, mass and lump, left lower limb: Secondary | ICD-10-CM | POA: Diagnosis not present

## 2017-11-11 DIAGNOSIS — M5416 Radiculopathy, lumbar region: Secondary | ICD-10-CM | POA: Diagnosis not present

## 2017-11-14 ENCOUNTER — Encounter (HOSPITAL_COMMUNITY): Payer: Self-pay

## 2017-11-14 ENCOUNTER — Ambulatory Visit (HOSPITAL_COMMUNITY): Payer: BLUE CROSS/BLUE SHIELD | Attending: Orthopedic Surgery

## 2017-11-14 ENCOUNTER — Other Ambulatory Visit: Payer: Self-pay

## 2017-11-14 DIAGNOSIS — R293 Abnormal posture: Secondary | ICD-10-CM | POA: Diagnosis not present

## 2017-11-14 DIAGNOSIS — G8929 Other chronic pain: Secondary | ICD-10-CM | POA: Insufficient documentation

## 2017-11-14 DIAGNOSIS — M545 Low back pain: Secondary | ICD-10-CM | POA: Insufficient documentation

## 2017-11-14 DIAGNOSIS — R2689 Other abnormalities of gait and mobility: Secondary | ICD-10-CM | POA: Diagnosis not present

## 2017-11-14 DIAGNOSIS — M6281 Muscle weakness (generalized): Secondary | ICD-10-CM | POA: Diagnosis not present

## 2017-11-14 NOTE — Therapy (Signed)
Timothy Shaw, [place], [address] Phone: [phone]   Fax:  [phone]  Physical Therapy Evaluation  Patient Details  Name: Timothy Shaw MRN: [id number] Date of Birth: 28-Dec-1955 Referring Provider: Phylliss Bob, MD   Encounter Date: 11/14/2017  PT End of Session - 11/14/17 1025    Visit Number  1    Number of Visits  13    Authorization Type  [employer] Blue [employer] (visit limit 30 per calender year; no authorization required)    Authorization Time Period  11/14/2017 - 12/26/17    Authorization - Visit Number  2    Authorization - Number of Visits  30    PT Start Time  0903    PT Stop Time  0950    PT Time Calculation (min)  47 min    Activity Tolerance  Patient tolerated treatment well    Behavior During Therapy  Timothy Shaw for tasks assessed/performed       Past Medical History:  Diagnosis Date  . Anxiety   . Anxiety disorder   . Arteriosclerotic cardiovascular disease (ASCVD)    with angina: 09/2009-100% proximal RCA; 50% proximal&60% mid LAD; 30-50% mid-Cx, normal EF  . Arthritis    Lyme disease in 10/2009 treated with antibiotics  . Chronic headaches   . Gastroesophageal reflux disease   . Hyperlipidemia    Total cholesterol-136, triglycerides of 89, HDL 48 and LDL of 70 in 03/2010  . Hypertension    Left ventricular hypertrophy  . PONV (postoperative nausea and vomiting)   . Tobacco abuse, in remission    20 pack years; mild airway obstruction on PFTs in 2010    Past Surgical History:  Procedure Laterality Date  . APPENDECTOMY  1970  . COLONOSCOPY  2008  . HERNIA REPAIR    . inguinal hernia repair    . JOINT REPLACEMENT     torn   . KNEE ARTHROSCOPY W/ MENISCAL REPAIR  2006, 2012  . NASAL SINUS SURGERY    . TONSILLECTOMY  1962  . UMBILICAL HERNIA REPAIR  2003    There were no vitals filed for this visit.   Subjective Assessment - 11/14/17 1006    Subjective  Patient arrives in  thoracolumbar ASPEN brace. reports history of LBP for several years and reports his pain prior to surgery radiated down his Lt leg. He reports it felt like a pulling senstation in the back of his leg and he had numbness/tinglingin his left foot. He reports some mild n umbness in his Lt toes still but states it is not as severe. He had TLIF of L4-5 performed on 08/21/17 and has been in a back brace for 12 weeks. He reports at his 6 week follow-up his MD found swelling in his Lt leg and ultrasound revealed 3 blood clots. He was placed on eliquis for treatment. He reports he had his 12 week follow up this Monday 11/11/17 andan ultrasound revealed only 1 clot remains. He has been cleared of all spinal precautions and was intructed to return to activity within tolerance. Timothy Shaw also reports histor of bil Knee pain and Rt SIJ pain, he states his LBP from his pinched nerve does not bother him as much any more and he feels more limited by his Rt SIJ pain. He is scheduled to have an injection for his SIJ on 12/18/17 but is hoping he can get in earlier. He denies pain at this time. He reports he is  not wearing the brace in his home but does wear it when going out for long periods like to the grocery store.    Pertinent History  Lt LE DVT (treated with eloquis), Rt SIJ pain, Bil Knee pain    Limitations  Sitting;Standing;Walking;House hold activities    How long can you sit comfortably?  unlimited (occasional Rt SIJ pain)    How long can you stand comfortably?  15-20 minutes    How long can you walk comfortably?  60 minutes    Patient Stated Goals  To be able to walk and stand throughuot the day without requiring his back brace to return to work    Currently in Pain?  No/denies         Timothy Shaw PT Assessment - 11/14/17 0001      Assessment   Medical Diagnosis  S/p TLIF L4-5    Referring Provider  Timothy Bob, MD    Onset Date/Surgical Date  08/21/17    Hand Dominance  Right    Next MD Visit  12/23/17     Prior Therapy  no      Precautions   Precautions  None      Restrictions   Weight Bearing Restrictions  No      Balance Screen   Has the patient fallen in the past 6 months  No    Has the patient had a decrease in activity level because of a fear of falling?   No    Is the patient reluctant to leave their home because of a fear of falling?   No      Home Social worker  Private residence    Living Arrangements  Spouse/significant other;Children    Available Help at Discharge  Family    Type of [place] to enter    Entrance Stairs-Number of Steps  3    Entrance Stairs-Rails  Can reach both    [place]  Two level    Alternate Level Stairs-Number of Steps  12 or more    Alternate Level Stairs-Rails  Right    Home Equipment  -- walking stick    Additional Comments  going up/down stairs 1 at a time because of knees and Rt hip      Prior Function   Level of Independence  Independent    Vocation  Full time employment    Designer, television/film set, Manufacturing engineer, work on Scientist, forensic need to climb ladder, crawl under machines, lifts parts up to 100lbs (mechanical lifting equipment, 10 shifts    Leisure  love gardening, yard work, walking around streets for exercise      Cognition   Overall Cognitive Status  Within Functional Limits for tasks assessed      Observation/Other Assessments   Focus on Therapeutic Outcomes (FOTO)   42% limited      Functional Tests   Functional tests  Single leg stance      Single Leg Stance   Comments  Rt LE = 10 seconds, Lt LE = 3-6 seconds      Posture/Postural Control   Posture/Postural Control  Postural limitations    Postural Limitations  Rounded Shoulders;Forward head;Increased thoracic kyphosis;Decreased lumbar lordosis      ROM / Strength   AROM / PROM / Strength  AROM;Strength      AROM   AROM Assessment Site  Lumbar    Lumbar Flexion  44  Lumbar Extension  13     Lumbar - Right Side Bend  10    Lumbar - Left Side Bend  10    Lumbar - Right Rotation  8    Lumbar - Left Rotation  6      Strength   Overall Strength Comments  Core/double leg raise test: 18 seconds    Strength Assessment Site  Hip;Knee;Ankle    Right Hip Flexion  4+/5    Right Hip Extension  4+/5    Right Hip ABduction  5/5    Left Hip Flexion  4+/5    Left Hip Extension  4+/5    Left Hip ABduction  4+/5    Right/Left Knee  Right;Left    Right Knee Flexion  4+/5    Right Knee Extension  5/5    Left Knee Flexion  4+/5    Left Knee Extension  5/5    Right Ankle Dorsiflexion  5/5    Right Ankle Plantar Flexion  5/5 20 heel raises    Left Ankle Dorsiflexion  5/5    Left Ankle Plantar Flexion  5/5 20 heel raises      Flexibility   Soft Tissue Assessment /Muscle Length  yes    Hamstrings  Bil LE: 90/140 degrees      Special Tests    Special Tests  Lumbar    Lumbar Tests  Straight Leg Raise;Slump Test      Slump test   Findings  Negative    Side  Left      Straight Leg Raise   Findings  Negative    Side   Left      Ambulation/Gait   Ambulation/Gait  Yes    Ambulation/Gait Assistance  7: Independent    Ambulation Distance (Feet)  406 Feet 2MWT    Assistive device  None    Gait Pattern  Within Functional Limits    Ambulation Surface  Level    Gait velocity  1.0 m/s       Objective measurements completed on examination: See above findings.    [facility] Adult PT Treatment/Exercise - 11/14/17 0001      Exercises   Exercises  Lumbar      Lumbar Exercises: Standing   Heel Raises  20 reps      Lumbar Exercises: Supine   Bridge  10 reps    Straight Leg Raise  10 reps;Limitations    Straight Leg Raises Limitations  bil LE        PT Education - 11/14/17 1024    Education Details  Educated on overall sxam findings and appropriate POC. Educated on initial HEP.    Person(s) Educated  Patient    Methods  Explanation;Handout    Comprehension  Verbalized  understanding;Returned demonstration       PT Short Term Goals - 11/14/17 1203      PT SHORT TERM GOAL #1   Title  Patient will be independent with HEP, updated PRN, to imrpove functional mobility, strength, and endurance.    Time  3    Period  Weeks    Status  New    Target Date  12/05/17      PT SHORT TERM GOAL #2   Title  Patient will improve bil LE strength testing to be 5/5 throughout for all groups to demonstrate imrpoved functional LE strngth.    Time  3    Period  Weeks    Status  New  PT SHORT TERM GOAL #3   Title  Patient will improve SLS for bil LE to = or > 15 seconds to improve balance during gait and stair mobility to reduce fall risk.    Time  3    Period  Weeks    Status  New        PT Long Term Goals - 11/14/17 1205      PT LONG TERM GOAL #1   Title  Patient will improve SLS for bil LE to = or > 30 seconds to improve balance during gait and stair mobility to reduce fall risk.    Time  6    Period  Weeks    Status  New    Target Date  12/26/17      PT LONG TERM GOAL #2   Title  Patient will improve lumbar ROM by 8 degrees or to WNL's and be pain free with AROM to improve mobility for daily work activities like bending/lifting.    Time  6    Period  Weeks    Status  New      PT LONG TERM GOAL #3   Title  Patient will improve gait velocity during 2MWT to = or > 1.2 m/s to improve functional gait and walking to normal community ambulation and demonstrate improve walign tolerance/endurance for return to work.    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    Time  6    Period  Weeks    Status  New      PT LONG TERM GOAL #5   Title  Patient will demosntrate correct floor to above head lifting mechanics as well as mechanics for  lifting from/to various heights to be able to return to work activitites.    Time  6    Period  Weeks    Status  New       Plan - 11/14/17 1028    Clinical Impression Statement  Timothy Shaw presents 12 weeks s/p TLIF L4-5. He arrives with Aspen brace and reports he continues to wear it outside of his home for longer periods of walking/standing to prevent increase in back pain. His pain has improved significantly since prior to surgery but he continues to have soreness with prolonged standing and walking. He has not attempted and lifting or bending since surgery and has some limitation with lumbar ROM. His strength is 4+/5 or greater throughout bil LE and he was able to maintain double leg raise for 18 second before losing form. He demonstrates impaired posture during gait and in sitting/standing and ill benefit from skilled PT interventions to address impairments and prepare for return to work with training on proper body mechanics for functional work activities.     Clinical Presentation  Stable    Clinical Presentation due to:  ROM, FOTO, MMT, 2MWT, posture, clinical judgement    Clinical Decision Making  Low    Rehab Potential  Good    PT Frequency  2x / week    PT Duration  6 weeks    PT Treatment/Interventions  ADLs/Self Care Home Management;Electrical Stimulation;Gait training;Functional mobility training;Stair training;Therapeutic activities;Therapeutic exercise;Balance training;Neuromuscular re-education;Patient/family education;Manual techniques;Passive range  of motion;Dry needling;Scar mobilization    PT Next Visit Plan  Review evaluation and goals. Follow up on HEP and initiate core strengthening/TA activation. Begin lumbar excursions for AROM, SLS balance exercises, and     PT Home Exercise Plan  Eval: SLR, birdge, heel raises;     Consulted and Agree with Plan of Care  Patient       Patient will benefit from skilled therapeutic intervention in order to improve the following deficits  and impairments:  Abnormal gait, Improper body mechanics, Pain, Impaired sensation, Postural dysfunction, Decreased mobility, Decreased activity tolerance, Decreased endurance, Decreased range of motion, Decreased strength, Decreased balance, Difficulty walking, Impaired flexibility  Visit Diagnosis: Chronic bilateral low back pain, with sciatica presence unspecified  Abnormal posture  Other abnormalities of gait and mobility  Muscle weakness (generalized)     Problem List Patient Active Problem List   Diagnosis Date Noted  . Radiculopathy 08/21/2017  . Hypercholesterolemia 12/18/2012  . Arteriosclerotic cardiovascular disease (ASCVD)   . Hyperlipidemia   . Hypertension 09/22/2010  . ANXIETY 11/10/2009  . Gastroesophageal reflux disease 12/03/2008    Kipp Brood, PT, DPT Physical Therapist with [place] [facility]  11/14/2017 12:20 PM    [facility] [place], [place], [address] Phone: [phone]   Fax:  [phone]  Name: THELONIOUS KAUFFMANN MRN: [id number] Date of Birth: 01-04-1956

## 2017-11-19 ENCOUNTER — Encounter (HOSPITAL_COMMUNITY): Payer: Self-pay

## 2017-11-19 ENCOUNTER — Ambulatory Visit (HOSPITAL_COMMUNITY): Payer: BLUE CROSS/BLUE SHIELD

## 2017-11-19 DIAGNOSIS — M545 Low back pain: Secondary | ICD-10-CM | POA: Diagnosis not present

## 2017-11-19 DIAGNOSIS — R293 Abnormal posture: Secondary | ICD-10-CM | POA: Diagnosis not present

## 2017-11-19 DIAGNOSIS — R2689 Other abnormalities of gait and mobility: Secondary | ICD-10-CM

## 2017-11-19 DIAGNOSIS — G8929 Other chronic pain: Secondary | ICD-10-CM

## 2017-11-19 DIAGNOSIS — M6281 Muscle weakness (generalized): Secondary | ICD-10-CM

## 2017-11-19 NOTE — Therapy (Signed)
[place], [place], [address] Phone: [phone]   Fax:  [phone]  Physical Therapy Treatment  Patient Details  Name: Timothy Shaw MRN: [id number] Date of Birth: Apr 24, 1956 Referring Provider: Phylliss Bob, MD   Encounter Date: 11/19/2017  PT End of Session - 11/19/17 0958    Visit Number  2    Number of Visits  13    Date for PT Re-Evaluation  12/26/17 Min[place]assess 12/05/17    Authorization Type  [employer] Blue [employer] (visit limit 30 per calender year; no authorization required)    Authorization Time Period  11/14/2017 - 12/26/17    Authorization - Visit Number  3    Authorization - Number of Visits  30    PT Start Time  [phone]    PT Stop Time  1035    PT Time Calculation (min)  43 min    Activity Tolerance  Patient tolerated treatment well    Behavior During Therapy  [facility] for tasks assessed/performed       Past Medical History:  Diagnosis Date  . Anxiety   . Anxiety disorder   . Arteriosclerotic cardiovascular disease (ASCVD)    with angina: 09/2009-100% proximal RCA; 50% proximal&60% mid LAD; 30-50% mid-Cx, normal EF  . Arthritis    Lyme disease in 10/2009 treated with antibiotics  . Chronic headaches   . Gastroesophageal reflux disease   . Hyperlipidemia    Total cholesterol-136, triglycerides of 89, HDL 48 and LDL of 70 in 03/2010  . Hypertension    Left ventricular hypertrophy  . PONV (postoperative nausea and vomiting)   . Tobacco abuse, in remission    20 pack years; mild airway obstruction on PFTs in 2010    Past Surgical History:  Procedure Laterality Date  . APPENDECTOMY  1970  . COLONOSCOPY  2008  . HERNIA REPAIR    . inguinal hernia repair    . JOINT REPLACEMENT     torn   . KNEE ARTHROSCOPY W/ MENISCAL REPAIR  2006, 2012  . NASAL SINUS SURGERY    . TONSILLECTOMY  1962  . UMBILICAL HERNIA REPAIR  2003    There were no vitals filed for this visit.  Subjective Assessment -  11/19/17 0955    Subjective  Pt stated he has pain over Rt SI, minimal pain on Lt side of lower back.  Reports compliance with HEP.  Arrived without brace for therapy today.    Pertinent History  Lt LE DVT (treated with eloquis), Rt SIJ pain, Bil Knee pain    Patient Stated Goals  To be able to walk and stand throughuot the day without requiring his back brace to return to work    Currently in Pain?  Yes    Pain Score  4     Pain Location  Back    Pain Orientation  Lower;Right    Pain Descriptors / Indicators  Sore;Sharp    Pain Type  Chronic pain    Pain Onset  More than a month ago    Pain Frequency  Constant    Aggravating Factors   Exercise    Pain Relieving Factors  aleve/ibuprofen    Effect of Pain on Daily Activities  out of work per MD orders         [facility] PT Assessment - 11/19/17 0001      Assessment   Medical Diagnosis  S/p TLIF L4-5    Referring Provider  Phylliss Bob,  MD    Onset Date/Surgical Date  08/21/17    Hand Dominance  Right    Next MD Visit  12/23/17 apt scheduled for injection Rt SI 12/18/17    Prior Therapy  no      Precautions   Precautions  None                   [facility] Adult PT Treatment/Exercise - 11/19/17 0001      Lumbar Exercises: Stretches   Active Hamstring Stretch  3 reps;30 seconds;Limitations    Active Hamstring Stretch Limitations  supine 1st with hands behind 2nd set with rope      Lumbar Exercises: Standing   Heel Raises  20 reps    Other Standing Lumbar Exercises  lumbar excursion 10x each direction    Other Standing Lumbar Exercises  SLS 3x Lt 25, Rt 33"       Lumbar Exercises: Supine   Bent Knee Raise  10 reps;3 seconds with TA activation, cueing for stabilty and breathing    Bridge  10 reps 3 sets x 10 reps    Straight Leg Raise  10 reps;Limitations 3 sets    Straight Leg Raises Limitations  bil LE             PT Education - 11/19/17 1019    Education Details  Reviewed goals, assured compliance wiht HEP  and copy of eval given to pt.  Pt educated on importance of core strengthening to assist with LBP    Person(s) Educated  Patient    Methods  Explanation;Demonstration;Handout    Comprehension  Verbalized understanding;Returned demonstration       PT Short Term Goals - 11/14/17 1203      PT SHORT TERM GOAL #1   Title  Patient will be independent with HEP, updated PRN, to imrpove functional mobility, strength, and endurance.    Time  3    Period  Weeks    Status  New    Target Date  12/05/17      PT SHORT TERM GOAL #2   Title  Patient will improve bil LE strength testing to be 5/5 throughout for all groups to demonstrate imrpoved functional LE strngth.    Time  3    Period  Weeks    Status  New      PT SHORT TERM GOAL #3   Title  Patient will improve SLS for bil LE to = or > 15 seconds to improve balance during gait and stair mobility to reduce fall risk.    Time  3    Period  Weeks    Status  New        PT Long Term Goals - 11/14/17 1205      PT LONG TERM GOAL #1   Title  Patient will improve SLS for bil LE to = or > 30 seconds to improve balance during gait and stair mobility to reduce fall risk.    Time  6    Period  Weeks    Status  New    Target Date  12/26/17      PT LONG TERM GOAL #2   Title  Patient will improve lumbar ROM by 8 degrees or to WNL's and be pain free with AROM to improve mobility for daily work activities like bending/lifting.    Time  6    Period  Weeks    Status  New      PT LONG TERM GOAL #  3   Title  Patient will improve gait velocity during 2MWT to = or > 1.2 m/s to improve functional gait and walking to normal community ambulation and demonstrate improve walign tolerance/endurance for return to work.    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    Time  6    Period  Weeks    Status  New      PT LONG TERM GOAL #5   Title  Patient will demosntrate correct floor to above head lifting mechanics as well as mechanics for lifting from/to various heights to be able to return to work activitites.    Time  6    Period  Weeks    Status  New            Plan - 11/19/17 1234    Clinical Impression Statement  Reviewed goals, assured compliance and proper form with HEP and copy of eval given to pt.  Pt able to recall and demonstrate HEP exercises with no cueing required.  Session focus on lumbar mobility and core strengthening.  Min cueing to improved TA activation and to continue breathing through new exercises.  No reports of increased pain through session.      Rehab Potential  Good    PT Duration  6 weeks    PT Treatment/Interventions  ADLs/Self Care Home Management;Electrical Stimulation;Gait training;Functional mobility training;Stair training;Therapeutic activities;Therapeutic exercise;Balance training;Neuromuscular re-education;Patient/family education;Manual techniques;Passive range of motion;Dry needling;Scar mobilization    PT Next Visit Plan  Continue core strengthening/TA activation. Begin lumbar excursions for AROM, SLS balance exercises, and strengthening.  Add clams with resistance, squats and 3D hip excursion to improve mobility with gait.    PT Home Exercise Plan  Eval: SLR, birdge, heel raises;     Consulted and Agree with Plan of Care  Patient       Patient will benefit from skilled therapeutic intervention in order to improve the following deficits and impairments:  Abnormal gait, Improper body mechanics, Pain, Impaired sensation, Postural dysfunction, Decreased mobility, Decreased activity tolerance, Decreased endurance, Decreased range of motion, Decreased strength, Decreased balance, Difficulty walking, Impaired flexibility  Visit Diagnosis: Chronic bilateral low back pain, with sciatica  presence unspecified  Abnormal posture  Other abnormalities of gait and mobility  Muscle weakness (generalized)     Problem List Patient Active Problem List   Diagnosis Date Noted  . Radiculopathy 08/21/2017  . Hypercholesterolemia 12/18/2012  . Arteriosclerotic cardiovascular disease (ASCVD)   . Hyperlipidemia   . Hypertension 09/22/2010  . ANXIETY 11/10/2009  . Gastroesophageal reflux disease 12/03/2008   Timothy Shaw, [place]; Francis  Aldona Lento 11/19/2017, 12:39 PM  [place] [address] [place], [place], [address] Phone: [phone]   Fax:  [phone]  Name: Timothy Shaw MRN: [id number] Date of Birth: 16-Jul-1955

## 2017-11-21 ENCOUNTER — Ambulatory Visit (HOSPITAL_COMMUNITY): Payer: BLUE CROSS/BLUE SHIELD

## 2017-11-21 ENCOUNTER — Encounter (HOSPITAL_COMMUNITY): Payer: Self-pay

## 2017-11-21 DIAGNOSIS — G8929 Other chronic pain: Secondary | ICD-10-CM

## 2017-11-21 DIAGNOSIS — M6281 Muscle weakness (generalized): Secondary | ICD-10-CM

## 2017-11-21 DIAGNOSIS — M545 Low back pain: Principal | ICD-10-CM

## 2017-11-21 DIAGNOSIS — R293 Abnormal posture: Secondary | ICD-10-CM | POA: Diagnosis not present

## 2017-11-21 DIAGNOSIS — R2689 Other abnormalities of gait and mobility: Secondary | ICD-10-CM

## 2017-11-21 NOTE — Therapy (Addendum)
[place], [place], [address] Phone: [phone]   Fax:  [phone]  Physical Therapy Treatment  Patient Details  Name: Timothy Shaw MRN: [id number] Date of Birth: [birth year] Referring Provider: Phylliss Bob, MD   Encounter Date: 11/21/2017  PT End of Session - 11/21/17 0952    Visit Number  3    Number of Visits  13    Date for PT Re-Evaluation  12/26/17    Authorization Type  [employer] [facility] (visit limit 30 per calender year; no authorization required)    Authorization Time Period  11/14/2017 - 12/26/17    Authorization - Visit Number  4    Authorization - Number of Visits  30    PT Start Time  0948    PT Stop Time  1030    PT Time Calculation (min)  42 min    Activity Tolerance  Patient tolerated treatment well    Behavior During Therapy  [facility] for tasks assessed/performed       Past Medical History:  Diagnosis Date  . Anxiety   . Anxiety disorder   . Arteriosclerotic cardiovascular disease (ASCVD)    with angina: 09/2009-100% proximal RCA; 50% proximal&60% mid LAD; 30-50% mid-Cx, normal EF  . Arthritis    Lyme disease in 10/2009 treated with antibiotics  . Chronic headaches   . Gastroesophageal reflux disease   . Hyperlipidemia    Total cholesterol-136, triglycerides of 89, HDL 48 and LDL of 70 in 03/2010  . Hypertension    Left ventricular hypertrophy  . PONV (postoperative nausea and vomiting)   . Tobacco abuse, in remission    20 pack years; mild airway obstruction on PFTs in 2010    Past Surgical History:  Procedure Laterality Date  . APPENDECTOMY  1970  . COLONOSCOPY  2008  . HERNIA REPAIR    . inguinal hernia repair    . JOINT REPLACEMENT     torn   . KNEE ARTHROSCOPY W/ MENISCAL REPAIR  2006, 2012  . NASAL SINUS SURGERY    . TONSILLECTOMY  1962  . UMBILICAL HERNIA REPAIR  2003    There were no vitals filed for this visit.  Subjective Assessment - 11/21/17 0949     Subjective  Pt stated he was mainly sore following last session, no real increased pain maybe 2-3/10 lower back/ Rt SI.  Reports compliance iwht HEP daily.    Pertinent History  Lt LE DVT (treated with eloquis), Rt SIJ pain, Bil Knee pain    Patient Stated Goals  To be able to walk and stand throughuot the day without requiring his back brace to return to work    Currently in Pain?  Yes    Pain Score  2     Pain Location  Back    Pain Orientation  Lower;Right    Pain Descriptors / Indicators  Sore    Pain Type  Chronic pain    Pain Onset  More than a month ago    Pain Frequency  Constant    Aggravating Factors   Exercise    Pain Relieving Factors  aleve/ibuprofen    Effect of Pain on Daily Activities  out of work per MD orders                       [facility] Adult PT Treatment/Exercise - 11/21/17 0001      Lumbar Exercises: Stretches   Active Hamstring  Stretch  3 reps;30 seconds;Limitations    Active Hamstring Stretch Limitations  supine 1st with hands behind 2nd set with rope    Single Knee to Chest Stretch  2 reps;30 seconds;Right;Left      Lumbar Exercises: Standing   Heel Raises  20 reps    Functional Squats  10 reps;Limitations    Functional Squats Limitations  3D hip excursion  10x    Other Standing Lumbar Exercises  lumbar excursion 10x each direction    Other Standing Lumbar Exercises  SLS 3x Rt 39", Lt 34" max; tandem stance 2x 30" on foam      Lumbar Exercises: Supine   Clam  10 reps;3 seconds;Limitations cuing for stabilitiy and breathing    Clam Limitations  RTB    Bent Knee Raise  15 reps;5 seconds with TA activaiton, cueing for stability and breathing    Bridge  15 reps    Straight Leg Raises Limitations  HEP               PT Short Term Goals - 11/14/17 1203      PT SHORT TERM GOAL #1   Title  Patient will be independent with HEP, updated PRN, to imrpove functional mobility, strength, and endurance.    Time  3    Period  Weeks    Status   New    Target Date  12/05/17      PT SHORT TERM GOAL #2   Title  Patient will improve bil LE strength testing to be 5/5 throughout for all groups to demonstrate imrpoved functional LE strngth.    Time  3    Period  Weeks    Status  New      PT SHORT TERM GOAL #3   Title  Patient will improve SLS for bil LE to = or > 15 seconds to improve balance during gait and stair mobility to reduce fall risk.    Time  3    Period  Weeks    Status  New        PT Long Term Goals - 11/14/17 1205      PT LONG TERM GOAL #1   Title  Patient will improve SLS for bil LE to = or > 30 seconds to improve balance during gait and stair mobility to reduce fall risk.    Time  6    Period  Weeks    Status  New    Target Date  12/26/17      PT LONG TERM GOAL #2   Title  Patient will improve lumbar ROM by 8 degrees or to WNL's and be pain free with AROM to improve mobility for daily work activities like bending/lifting.    Time  6    Period  Weeks    Status  New      PT LONG TERM GOAL #3   Title  Patient will improve gait velocity during 2MWT to = or > 1.2 m/s to improve functional gait and walking to normal community ambulation and demonstrate improve walign tolerance/endurance for return to work.    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    Time  6  Period  Weeks    Status  New      PT LONG TERM GOAL #5   Title  Patient will demosntrate correct floor to above head lifting mechanics as well as mechanics for lifting from/to various heights to be able to return to work activitites.    Time  6    Period  Weeks    Status  New            Plan - 11/21/17 1022    Clinical Impression Statement  Session focus on progressing core stability, proximal strengthening and balance  training.  Min cueing to improve TA activation and continue breathing through exercises.  Progressed to 3D hip excursion for mobility, clam with resistance for strengthening and tandem stance on foam for balance training.  No reports of increased pain through session.      Rehab Potential  Good    PT Frequency  2x / week    PT Duration  6 weeks    PT Treatment/Interventions  ADLs/Self Care Home Management;Electrical Stimulation;Gait training;Functional mobility training;Stair training;Therapeutic activities;Therapeutic exercise;Balance training;Neuromuscular re-education;Patient/family education;Manual techniques;Passive range of motion;Dry needling;Scar mobilization    PT Next Visit Plan  Continue core strengthening/TA activation. Begin lumbar excursions for AROM, SLS balance exercises, and strengthening.  Add standing marching, lunges and vector stance    PT Home Exercise Plan  Eval: SLR, birdge, heel raises;     Consulted and Agree with Plan of Care  Patient       Patient will benefit from skilled therapeutic intervention in order to improve the following deficits and impairments:  Abnormal gait, Improper body mechanics, Pain, Impaired sensation, Postural dysfunction, Decreased mobility, Decreased activity tolerance, Decreased endurance, Decreased range of motion, Decreased strength, Decreased balance, Difficulty walking, Impaired flexibility  Visit Diagnosis: Chronic bilateral low back pain, with sciatica presence unspecified  Abnormal posture  Other abnormalities of gait and mobility  Muscle weakness (generalized)     Problem List Patient Active Problem List   Diagnosis Date Noted  . Radiculopathy 08/21/2017  . Hypercholesterolemia 12/18/2012  . Arteriosclerotic cardiovascular disease (ASCVD)   . Hyperlipidemia   . Hypertension 09/22/2010  . ANXIETY 11/10/2009  . Gastroesophageal reflux disease 12/03/2008   Ihor Austin, [place]; [place]  Aldona Lento 11/21/2017, 10:49 AM  [place], [place], [address] Phone: [phone]   Fax:  [phone]  Name: Timothy Shaw MRN: [id number] Date of Birth: 03-21-1956

## 2017-11-26 ENCOUNTER — Other Ambulatory Visit: Payer: Self-pay

## 2017-11-26 ENCOUNTER — Encounter (HOSPITAL_COMMUNITY): Payer: Self-pay

## 2017-11-26 ENCOUNTER — Ambulatory Visit (HOSPITAL_COMMUNITY): Payer: BLUE CROSS/BLUE SHIELD

## 2017-11-26 DIAGNOSIS — M6281 Muscle weakness (generalized): Secondary | ICD-10-CM | POA: Diagnosis not present

## 2017-11-26 DIAGNOSIS — R293 Abnormal posture: Secondary | ICD-10-CM | POA: Diagnosis not present

## 2017-11-26 DIAGNOSIS — G8929 Other chronic pain: Secondary | ICD-10-CM

## 2017-11-26 DIAGNOSIS — R2689 Other abnormalities of gait and mobility: Secondary | ICD-10-CM | POA: Diagnosis not present

## 2017-11-26 DIAGNOSIS — M545 Low back pain: Secondary | ICD-10-CM | POA: Diagnosis not present

## 2017-11-26 NOTE — Therapy (Signed)
[place], [place], [address] Phone: [phone]   Fax:  [phone]  Physical Therapy Treatment  Patient Details  Name: Timothy Shaw MRN: [id number] Date of Birth: [birth year] Referring Provider: Phylliss Bob, MD   Encounter Date: 11/26/2017  PT End of Session - 11/26/17 0952    Visit Number  4    Number of Visits  13    Date for PT Re-Evaluation  12/26/17    Authorization Type  [employer] (visit limit 30 per calender year; no authorization required)    Authorization Time Period  11/14/2017 - 12/26/17    Authorization - Visit Number  5    Authorization - Number of Visits  30    PT Start Time  3329    PT Stop Time  1031    PT Time Calculation (min)  44 min    Activity Tolerance  Patient tolerated treatment well    Behavior During Therapy  [facility] for tasks assessed/performed       Past Medical History:  Diagnosis Date  . Anxiety   . Anxiety disorder   . Arteriosclerotic cardiovascular disease (ASCVD)    with angina: 09/2009-100% proximal RCA; 50% proximal&60% mid LAD; 30-50% mid-Cx, normal EF  . Arthritis    Lyme disease in 10/2009 treated with antibiotics  . Chronic headaches   . Gastroesophageal reflux disease   . Hyperlipidemia    Total cholesterol-136, triglycerides of 89, HDL 48 and LDL of 70 in 03/2010  . Hypertension    Left ventricular hypertrophy  . PONV (postoperative nausea and vomiting)   . Tobacco abuse, in remission    20 pack years; mild airway obstruction on PFTs in 2010    Past Surgical History:  Procedure Laterality Date  . APPENDECTOMY  1970  . COLONOSCOPY  2008  . HERNIA REPAIR    . inguinal hernia repair    . JOINT REPLACEMENT     torn   . KNEE ARTHROSCOPY W/ MENISCAL REPAIR  2006, 2012  . NASAL SINUS SURGERY    . TONSILLECTOMY  1962  . UMBILICAL HERNIA REPAIR  2003    There were no vitals filed for this visit.  Subjective Assessment - 11/26/17 0949    Subjective  Patinet arrives without his ASPEN brace today. He reprots Saturday he did a couple hours of yardwork without wearing his brace on Saturday and he was sore on Sunday. He states he did his exercises Saturday but he took Sunday off because of this.    Pertinent History  Lt LE DVT (treated with eloquis), Rt SIJ pain, Bil Knee pain    Limitations  Sitting;Standing;Walking;House hold activities    Patient Stated Goals  To be able to walk and stand throughuot the day without requiring his back brace to return to work    Currently in Pain?  Yes    Pain Score  3     Pain Location  Back    Pain Orientation  Right;Lower    Pain Descriptors / Indicators  Aching;Sore    Pain Type  Chronic pain    Pain Onset  More than a month ago    Pain Frequency  Constant    Aggravating Factors   yardwork, weeding    Pain Relieving Factors  stretches, medicine    Effect of Pain on Daily Activities  some limitation       [facility] Adult PT Treatment/Exercise - 11/26/17 0001  Lumbar Exercises: Stretches   Active Hamstring Stretch  3 reps;30 seconds;Limitations;Right;Left    Active Hamstring Stretch Limitations  12" box    Single Knee to Chest Stretch  Left;Right;5 reps;10 seconds    Lower Trunk Rotation  5 reps;10 seconds;Limitations    Lower Trunk Rotation Limitations  Bil      Lumbar Exercises: Standing   Heel Raises  20 reps;Limitations    Heel Raises Limitations  bil LE, single leg    Other Standing Lumbar Exercises  lumbar excursion 10x each direction (flex/ext, Lt/Rt sidebend, Lt/Rt rot)    Other Standing Lumbar Exercises  SLS wtih vectors: 3 directions 10" holds, 5 reps Bil LE      Lumbar Exercises: Supine   Bent Knee Raise  20 reps;5 seconds;Limitations    Bent Knee Raise Limitations  TA activation throughout with post pelvic tilt    Bridge  20 reps;3 seconds      Lumbar Exercises: Quadruped   Opposite Arm/Leg Raise  Right arm/Left leg;Left arm/Right leg;10 reps;2 seconds        PT  Education - 11/26/17 1020    Education Details  Educated on exercise form throughout and updated HEP.    Person(s) Educated  Patient    Methods  Explanation;Handout;Demonstration    Comprehension  Verbalized understanding;Returned demonstration       PT Short Term Goals - 11/26/17 0952      PT SHORT TERM GOAL #1   Title  Patient will be independent with HEP, updated PRN, to imrpove functional mobility, strength, and endurance.    Time  3    Period  Weeks    Status  Achieved      PT SHORT TERM GOAL #2   Title  Patient will improve bil LE strength testing to be 5/5 throughout for all groups to demonstrate imrpoved functional LE strngth.    Time  3    Period  Weeks    Status  On-going      PT SHORT TERM GOAL #3   Title  Patient will improve SLS for bil LE to = or > 15 seconds to improve balance during gait and stair mobility to reduce fall risk.    Time  3    Period  Weeks    Status  On-going        PT Long Term Goals - 11/26/17 0953      PT LONG TERM GOAL #1   Title  Patient will improve SLS for bil LE to = or > 30 seconds to improve balance during gait and stair mobility to reduce fall risk.    Time  6    Period  Weeks    Status  On-going      PT LONG TERM GOAL #2   Title  Patient will improve lumbar ROM by 8 degrees or to WNL's and be pain free with AROM to improve mobility for daily work activities like bending/lifting.    Time  6    Period  Weeks    Status  On-going      PT LONG TERM GOAL #3   Title  Patient will improve gait velocity during 2MWT to = or > 1.2 m/s to improve functional gait and walking to normal community ambulation and demonstrate improve walign tolerance/endurance for return to work.    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    Time  6    Period  Weeks    Status  On-going      PT LONG TERM GOAL #5   Title  Patient will demosntrate correct floor to above head lifting mechanics as well as mechanics for lifting from/to various heights to be able to return to work activitites.    Time  6    Period  Weeks    Status  On-going        Plan - 11/26/17 3845    Clinical Impression Statement  Continued with focus on core strengthening and progressed to TA activation during quadruped exercise. Continued with hip strengthening and lumbar excursion for mobility and updated HEP with advanced core exercise and lower trunk rotation stretch. Patient initiated SLS with vectors today for balance training and required intermittent UE support to maintain balance. He will continue to benefit from skilled PT interventions to address impairments and progress towards goals.    Rehab Potential  Good    PT Frequency  2x / week    PT Duration  6 weeks    PT Treatment/Interventions  ADLs/Self Care Home Management;Electrical Stimulation;Gait training;Functional mobility training;Stair training;Therapeutic activities;Therapeutic exercise;Balance training;Neuromuscular re-education;Patient/family education;Manual techniques;Passive range of motion;Dry needling;Scar mobilization    PT Next Visit Plan  Continue core strengthening/TA activation. Begin lumbar excursions for AROM, SLS balance exercises, and strengthening.  Progress balance and with marching, and vector stance. And Increase functional core exercises with tandem and forward punches.    PT Home Exercise Plan  Eval: SLR, birdge, heel raises;     Consulted and Agree with Plan of Care  Patient       Patient will benefit from skilled therapeutic intervention in order to improve the following deficits and impairments:  Abnormal gait, Improper body mechanics, Pain, Impaired sensation, Postural dysfunction, Decreased mobility, Decreased activity tolerance, Decreased endurance,  Decreased range of motion, Decreased strength, Decreased balance, Difficulty walking, Impaired flexibility  Visit Diagnosis: Chronic bilateral low back pain, with sciatica presence unspecified  Abnormal posture  Other abnormalities of gait and mobility  Muscle weakness (generalized)     Problem List Patient Active Problem List   Diagnosis Date Noted  . Radiculopathy 08/21/2017  . Hypercholesterolemia 12/18/2012  . Arteriosclerotic cardiovascular disease (ASCVD)   . Hyperlipidemia   . Hypertension 09/22/2010  . ANXIETY 11/10/2009  . Gastroesophageal reflux disease 12/03/2008    Kipp Brood, PT, DPT Physical Therapist with [facility]  11/26/2017 12:00 PM    [place], [place], [address] Phone: [phone]   Fax:  [phone]  Name: TYQUEZ HOLLIBAUGH MRN: [id number] Date of Birth: 1955/11/07

## 2017-11-28 ENCOUNTER — Encounter (HOSPITAL_COMMUNITY): Payer: Self-pay

## 2017-11-28 ENCOUNTER — Ambulatory Visit (HOSPITAL_COMMUNITY): Payer: BLUE CROSS/BLUE SHIELD

## 2017-11-28 DIAGNOSIS — M6281 Muscle weakness (generalized): Secondary | ICD-10-CM

## 2017-11-28 DIAGNOSIS — R2689 Other abnormalities of gait and mobility: Secondary | ICD-10-CM

## 2017-11-28 DIAGNOSIS — R293 Abnormal posture: Secondary | ICD-10-CM

## 2017-11-28 DIAGNOSIS — M545 Low back pain: Principal | ICD-10-CM

## 2017-11-28 DIAGNOSIS — G8929 Other chronic pain: Secondary | ICD-10-CM | POA: Diagnosis not present

## 2017-11-28 NOTE — Therapy (Signed)
[place], [place], [address] Phone: [phone]   Fax:  [phone]  Physical Therapy Treatment  Patient Details  Name: Timothy Shaw MRN: [id number] Date of Birth: Jan 29, 1956 Referring Provider: Phylliss Bob, MD   Encounter Date: 11/28/2017  PT End of Session - 11/28/17 0952    Visit Number  5    Number of Visits  13    Date for PT Re-Evaluation  12/26/17 Minireassess 12/05/17    Authorization Type  [employer] (visit limit 30 per calender year; no authorization required)    Authorization Time Period  11/14/2017 - 12/26/17    Authorization - Visit Number  6    Authorization - Number of Visits  30    PT Start Time  3382    PT Stop Time  1033    PT Time Calculation (min)  45 min    Activity Tolerance  Patient tolerated treatment well    Behavior During Therapy  [facility] for tasks assessed/performed       Past Medical History:  Diagnosis Date  . Anxiety   . Anxiety disorder   . Arteriosclerotic cardiovascular disease (ASCVD)    with angina: 09/2009-100% proximal RCA; 50% proximal&60% mid LAD; 30-50% mid-Cx, normal EF  . Arthritis    Lyme disease in 10/2009 treated with antibiotics  . Chronic headaches   . Gastroesophageal reflux disease   . Hyperlipidemia    Total cholesterol-136, triglycerides of 89, HDL 48 and LDL of 70 in 03/2010  . Hypertension    Left ventricular hypertrophy  . PONV (postoperative nausea and vomiting)   . Tobacco abuse, in remission    20 pack years; mild airway obstruction on PFTs in 2010    Past Surgical History:  Procedure Laterality Date  . APPENDECTOMY  1970  . COLONOSCOPY  2008  . HERNIA REPAIR    . inguinal hernia repair    . JOINT REPLACEMENT     torn   . KNEE ARTHROSCOPY W/ MENISCAL REPAIR  2006, 2012  . NASAL SINUS SURGERY    . TONSILLECTOMY  1962  . UMBILICAL HERNIA REPAIR  2003    There were no vitals filed for this visit.  Subjective Assessment -  11/28/17 0950    Subjective  Pt stated he was really sore last night, current pain scale 3/10 LBP on Lt and Rt.  Pt unsure what he did inparticular for pain.    Patient Stated Goals  To be able to walk and stand throughuot the day without requiring his back brace to return to work    Currently in Pain?  Yes    Pain Score  3     Pain Location  Back    Pain Orientation  Lower;Right    Pain Descriptors / Indicators  Sore    Pain Type  Chronic pain    Pain Frequency  Constant    Aggravating Factors   yardwork, weeding    Pain Relieving Factors  stretches, medicine    Effect of Pain on Daily Activities  some limitation         [facility] PT Assessment - 11/28/17 0001      Assessment   Medical Diagnosis  S/p TLIF L4-5    Referring Provider  Phylliss Bob, MD    Onset Date/Surgical Date  08/21/17    Hand Dominance  Right    Next MD Visit  12/23/17 12/18/17 apt scheduled for injection Rt SI  Prior Therapy  no      Precautions   Precautions  None                   [facility] Adult PT Treatment/Exercise - 11/28/17 0001      Lumbar Exercises: Stretches   Active Hamstring Stretch  3 reps;30 seconds;Limitations;Right;Left    Active Hamstring Stretch Limitations  supine     Single Knee to Chest Stretch  Left;Right;5 reps;10 seconds    Lower Trunk Rotation  5 reps;10 seconds;Limitations    Lower Trunk Rotation Limitations  Bil      Lumbar Exercises: Standing   Functional Squats  10 reps;Limitations    Functional Squats Limitations  3D hip excursion  10x    Other Standing Lumbar Exercises  lumbar excursion 10x each direction (flex/ext, Lt/Rt sidebend, Lt/Rt rot)    Other Standing Lumbar Exercises  marching no HHA with ab set 10x 5"; vector stance 5x 10"; tandem stance on foam 2x 30"      Lumbar Exercises: Supine   Bent Knee Raise  20 reps;5 seconds;Limitations    Bent Knee Raise Limitations  TA activation throughout with post pelvic tilt    Single Leg Bridge  5 reps;Limitations     Bridge with Ball Squeeze Limitations  with ab set      Lumbar Exercises: Quadruped   Opposite Arm/Leg Raise  Right arm/Left leg;Left arm/Right leg;10 reps;5 seconds               PT Short Term Goals - 11/26/17 6761      PT SHORT TERM GOAL #1   Title  Patient will be independent with HEP, updated PRN, to imrpove functional mobility, strength, and endurance.    Time  3    Period  Weeks    Status  Achieved      PT SHORT TERM GOAL #2   Title  Patient will improve bil LE strength testing to be 5/5 throughout for all groups to demonstrate imrpoved functional LE strngth.    Time  3    Period  Weeks    Status  On-going      PT SHORT TERM GOAL #3   Title  Patient will improve SLS for bil LE to = or > 15 seconds to improve balance during gait and stair mobility to reduce fall risk.    Time  3    Period  Weeks    Status  On-going        PT Long Term Goals - 11/26/17 0953      PT LONG TERM GOAL #1   Title  Patient will improve SLS for bil LE to = or > 30 seconds to improve balance during gait and stair mobility to reduce fall risk.    Time  6    Period  Weeks    Status  On-going      PT LONG TERM GOAL #2   Title  Patient will improve lumbar ROM by 8 degrees or to WNL's and be pain free with AROM to improve mobility for daily work activities like bending/lifting.    Time  6    Period  Weeks    Status  On-going      PT LONG TERM GOAL #3   Title  Patient will improve gait velocity during 2MWT to = or > 1.2 m/s to improve functional gait and walking to normal community ambulation and demonstrate improve walign tolerance/endurance for return to work.    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    Time  6    Period  Weeks    Status  On-going       PT LONG TERM GOAL #5   Title  Patient will demosntrate correct floor to above head lifting mechanics as well as mechanics for lifting from/to various heights to be able to return to work activitites.    Time  6    Period  Weeks    Status  On-going            Plan - 11/28/17 1031    Clinical Impression Statement  Continued session focus with core stability and lumbar mobility.  Progressed SLS activites/balance activities for advanced core strengthening with additional marching with ab set, tandem stance on foam and continued vector stance.  Also progressed proximal strengthening with advanced single leg bridges.  Stretches complete to address lumbar mobility and overall soreness.  No reports of increased pain through session, did have some soreness.      Rehab Potential  Good    PT Frequency  2x / week    PT Duration  6 weeks    PT Treatment/Interventions  ADLs/Self Care Home Management;Electrical Stimulation;Gait training;Functional mobility training;Stair training;Therapeutic activities;Therapeutic exercise;Balance training;Neuromuscular re-education;Patient/family education;Manual techniques;Passive range of motion;Dry needling;Scar mobilization    PT Next Visit Plan  Continue core strengthening/TA activation. Begin lumbar excursions for AROM, SLS balance exercises, and strengthening.  Progress balance and with marching, and vector stance. And Increase functional core exercises with tandem and forward punches.    PT Home Exercise Plan  Eval: SLR, birdge, heel raises;        Patient will benefit from skilled therapeutic intervention in order to improve the following deficits and impairments:  Abnormal gait, Improper body mechanics, Pain, Impaired sensation, Postural dysfunction, Decreased mobility, Decreased activity tolerance, Decreased endurance, Decreased range of motion, Decreased strength, Decreased balance, Difficulty walking, Impaired flexibility  Visit Diagnosis: Chronic bilateral  low back pain, with sciatica presence unspecified  Abnormal posture  Other abnormalities of gait and mobility  Muscle weakness (generalized)     Problem List Patient Active Problem List   Diagnosis Date Noted  . Radiculopathy 08/21/2017  . Hypercholesterolemia 12/18/2012  . Arteriosclerotic cardiovascular disease (ASCVD)   . Hyperlipidemia   . Hypertension 09/22/2010  . ANXIETY 11/10/2009  . Gastroesophageal reflux disease 12/03/2008   Ihor Austin, [place]; [place]  Aldona Lento 11/28/2017, 10:37 AM  [place], [place], [address] Phone: [phone]   Fax:  [phone]  Name: Timothy Shaw MRN: [id number] Date of Birth: 27-Oct-1955

## 2017-12-03 ENCOUNTER — Ambulatory Visit (HOSPITAL_COMMUNITY): Payer: BLUE CROSS/BLUE SHIELD | Attending: Orthopedic Surgery

## 2017-12-03 ENCOUNTER — Encounter (HOSPITAL_COMMUNITY): Payer: Self-pay

## 2017-12-03 DIAGNOSIS — R293 Abnormal posture: Secondary | ICD-10-CM | POA: Diagnosis not present

## 2017-12-03 DIAGNOSIS — M545 Low back pain: Secondary | ICD-10-CM | POA: Insufficient documentation

## 2017-12-03 DIAGNOSIS — M6281 Muscle weakness (generalized): Secondary | ICD-10-CM | POA: Diagnosis not present

## 2017-12-03 DIAGNOSIS — R2689 Other abnormalities of gait and mobility: Secondary | ICD-10-CM | POA: Diagnosis not present

## 2017-12-03 DIAGNOSIS — G8929 Other chronic pain: Secondary | ICD-10-CM | POA: Insufficient documentation

## 2017-12-03 NOTE — Therapy (Signed)
[place], [place], [address] Phone: [phone]   Fax:  [phone]  Physical Therapy Treatment  Patient Details  Name: Timothy Shaw MRN: [id number] Date of Birth: 05/20/1956 Referring Provider: Phylliss Bob, MD   Encounter Date: 12/03/2017  PT End of Session - 12/03/17 0953    Visit Number  6    Number of Visits  13    Date for PT Re-Evaluation  12/26/17 Minireassess 12/05/17    Authorization Type  [employer] (visit limit 30 per calender year; no authorization required)    Authorization Time Period  11/14/2017 - 12/26/17    Authorization - Visit Number  7    Authorization - Number of Visits  30    PT Start Time  0949    PT Stop Time  1035    PT Time Calculation (min)  46 min    Activity Tolerance  Patient tolerated treatment well    Behavior During Therapy  [facility] for tasks assessed/performed       Past Medical History:  Diagnosis Date  . Anxiety   . Anxiety disorder   . Arteriosclerotic cardiovascular disease (ASCVD)    with angina: 09/2009-100% proximal RCA; 50% proximal&60% mid LAD; 30-50% mid-Cx, normal EF  . Arthritis    Lyme disease in 10/2009 treated with antibiotics  . Chronic headaches   . Gastroesophageal reflux disease   . Hyperlipidemia    Total cholesterol-136, triglycerides of 89, HDL 48 and LDL of 70 in 03/2010  . Hypertension    Left ventricular hypertrophy  . PONV (postoperative nausea and vomiting)   . Tobacco abuse, in remission    20 pack years; mild airway obstruction on PFTs in 2010    Past Surgical History:  Procedure Laterality Date  . APPENDECTOMY  1970  . COLONOSCOPY  2008  . HERNIA REPAIR    . inguinal hernia repair    . JOINT REPLACEMENT     torn   . KNEE ARTHROSCOPY W/ MENISCAL REPAIR  2006, 2012  . NASAL SINUS SURGERY    . TONSILLECTOMY  1962  . UMBILICAL HERNIA REPAIR  2003    There were no vitals filed for this visit.  Subjective Assessment -  12/03/17 0951    Subjective  Pt stated his knees have been bothering him worse in afternoon.  Reports low pain scale1-2/10 lower back.  Rt SI continues to bother him.    Patient Stated Goals  To be able to walk and stand throughuot the day without requiring his back brace to return to work    Currently in Pain?  Yes    Pain Score  2     Pain Location  Back    Pain Orientation  Lower;Right    Pain Descriptors / Indicators  Sore    Pain Type  Chronic pain    Pain Onset  More than a month ago    Pain Frequency  Constant    Aggravating Factors   yardwork, weeding    Pain Relieving Factors  stretches, medicine    Effect of Pain on Daily Activities  some limitation                       [facility] Adult PT Treatment/Exercise - 12/03/17 0001      Lumbar Exercises: Stretches   Active Hamstring Stretch  30 seconds;Limitations;Right;Left;2 reps    Active Hamstring Stretch Limitations  supine  Single Knee to Chest Stretch  Right;Left;2 reps;30 seconds    Lower Trunk Rotation  5 reps;10 seconds;Limitations    Lower Trunk Rotation Limitations  Bil      Lumbar Exercises: Standing   Functional Squats  10 reps;Limitations    Functional Squats Limitations  3D hip excursion  10x (mat behind for squat mechanics)    Other Standing Lumbar Exercises  lumbar excursion 10x each direction (flex/ext, Lt/Rt sidebend, Lt/Rt rot)    Other Standing Lumbar Exercises  marching no HHA with ab set 10x 5"; vector stance 5x 10"; tandem stance on foam 2x 30" with palvo 15x each direction last set      Lumbar Exercises: Supine   Bent Knee Raise  20 reps;5 seconds;Limitations    Bent Knee Raise Limitations  TA activation throughout with post pelvic tilt    Single Leg Bridge  15 reps    Bridge with [employer] Limitations  with ab set      Lumbar Exercises: Quadruped   Opposite Arm/Leg Raise  Right arm/Left leg;Left arm/Right leg;10 reps;5 seconds               PT Short Term Goals - 11/26/17  3474      PT SHORT TERM GOAL #1   Title  Patient will be independent with HEP, updated PRN, to imrpove functional mobility, strength, and endurance.    Time  3    Period  Weeks    Status  Achieved      PT SHORT TERM GOAL #2   Title  Patient will improve bil LE strength testing to be 5/5 throughout for all groups to demonstrate imrpoved functional LE strngth.    Time  3    Period  Weeks    Status  On-going      PT SHORT TERM GOAL #3   Title  Patient will improve SLS for bil LE to = or > 15 seconds to improve balance during gait and stair mobility to reduce fall risk.    Time  3    Period  Weeks    Status  On-going        PT Long Term Goals - 11/26/17 0953      PT LONG TERM GOAL #1   Title  Patient will improve SLS for bil LE to = or > 30 seconds to improve balance during gait and stair mobility to reduce fall risk.    Time  6    Period  Weeks    Status  On-going      PT LONG TERM GOAL #2   Title  Patient will improve lumbar ROM by 8 degrees or to WNL's and be pain free with AROM to improve mobility for daily work activities like bending/lifting.    Time  6    Period  Weeks    Status  On-going      PT LONG TERM GOAL #3   Title  Patient will improve gait velocity during 2MWT to = or > 1.2 m/s to improve functional gait and walking to normal community ambulation and demonstrate improve walign tolerance/endurance for return to work.    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    Time  6    Period  Weeks    Status  On-going      PT LONG TERM GOAL #5   Title  Patient will demosntrate correct floor to above head lifting mechanics as well as mechanics for lifting from/to various heights to be able to return to work activitites.    Time  6    Period   Weeks    Status  On-going            Plan - 12/03/17 1019    Clinical Impression Statement  Continued session focus with core stability and lumbar mobility.  Progressed balalcne activities with additional palvo for advanced core strengthening.  Continued with excursions and stretches to address mobility for pain control.  No reports of increased pain through session, did c/o of some soreness at rest.      Rehab Potential  Good    PT Frequency  2x / week    PT Duration  6 weeks    PT Treatment/Interventions  ADLs/Self Care Home Management;Electrical Stimulation;Gait training;Functional mobility training;Stair training;Therapeutic activities;Therapeutic exercise;Balance training;Neuromuscular re-education;Patient/family education;Manual techniques;Passive range of motion;Dry needling;Scar mobilization    PT Next Visit Plan  Minireassess next session.  Continue core strengthening/TA activation. Continue lumbar excursions for AROM, SLS balance exercises, and strengthening.  Progress balance and with marching, and vector stance. And Increase functional core exercises with tandem and forward punches.    PT Home Exercise Plan  Eval: SLR, birdge, heel raises;        Patient will benefit from skilled therapeutic intervention in order to improve the following deficits and impairments:  Abnormal gait, Improper body mechanics, Pain, Impaired sensation, Postural dysfunction, Decreased mobility, Decreased activity tolerance, Decreased endurance, Decreased range of motion, Decreased strength, Decreased balance, Difficulty walking, Impaired flexibility  Visit Diagnosis: Chronic bilateral low back pain, with sciatica presence unspecified  Abnormal posture  Other abnormalities of gait and mobility  Muscle weakness (generalized)     Problem List Patient Active Problem List   Diagnosis Date Noted  . Radiculopathy 08/21/2017  . Hypercholesterolemia 12/18/2012  . Arteriosclerotic cardiovascular  disease (ASCVD)   . Hyperlipidemia   . Hypertension 09/22/2010  . ANXIETY 11/10/2009  . Gastroesophageal reflux disease 12/03/2008   Ihor Austin, [place]; [place]  Aldona Lento 12/03/2017, 10:39 AM  [place], [place], [address] Phone: [phone]   Fax:  [phone]  Name: Timothy Shaw MRN: [id number] Date of Birth: June 21, 1955

## 2017-12-06 ENCOUNTER — Encounter (HOSPITAL_COMMUNITY): Payer: Self-pay

## 2017-12-06 ENCOUNTER — Ambulatory Visit (HOSPITAL_COMMUNITY): Payer: BLUE CROSS/BLUE SHIELD

## 2017-12-06 DIAGNOSIS — G8929 Other chronic pain: Secondary | ICD-10-CM

## 2017-12-06 DIAGNOSIS — R2689 Other abnormalities of gait and mobility: Secondary | ICD-10-CM | POA: Diagnosis not present

## 2017-12-06 DIAGNOSIS — R293 Abnormal posture: Secondary | ICD-10-CM | POA: Diagnosis not present

## 2017-12-06 DIAGNOSIS — M545 Low back pain: Secondary | ICD-10-CM | POA: Diagnosis not present

## 2017-12-06 DIAGNOSIS — M6281 Muscle weakness (generalized): Secondary | ICD-10-CM

## 2017-12-06 NOTE — Therapy (Signed)
[place], [place], [address] Phone: [phone]   Fax:  [phone]  Physical Therapy Treatment  Patient Details  Name: Timothy Shaw MRN: [id number] Date of Birth: 1955-09-23 Referring Provider: Phylliss Bob, MD   Encounter Date: 12/06/2017  PT End of Session - 12/06/17 1517    Visit Number  7    Number of Visits  13    Date for PT Re-Evaluation  12/26/17 Minireassess complete 12/06/17    Authorization Type  [employer] (visit limit 30 per calender year; no authorization required)    Authorization Time Period  11/14/2017 - 12/26/17    Authorization - Visit Number  8    Authorization - Number of Visits  30    PT Start Time  5809    PT Stop Time  1603    PT Time Calculation (min)  49 min    Activity Tolerance  Patient tolerated treatment well    Behavior During Therapy  [facility] for tasks assessed/performed       Past Medical History:  Diagnosis Date  . Anxiety   . Anxiety disorder   . Arteriosclerotic cardiovascular disease (ASCVD)    with angina: 09/2009-100% proximal RCA; 50% proximal&60% mid LAD; 30-50% mid-Cx, normal EF  . Arthritis    Lyme disease in 10/2009 treated with antibiotics  . Chronic headaches   . Gastroesophageal reflux disease   . Hyperlipidemia    Total cholesterol-136, triglycerides of 89, HDL 48 and LDL of 70 in 03/2010  . Hypertension    Left ventricular hypertrophy  . PONV (postoperative nausea and vomiting)   . Tobacco abuse, in remission    20 pack years; mild airway obstruction on PFTs in 2010    Past Surgical History:  Procedure Laterality Date  . APPENDECTOMY  1970  . COLONOSCOPY  2008  . HERNIA REPAIR    . inguinal hernia repair    . JOINT REPLACEMENT     torn   . KNEE ARTHROSCOPY W/ MENISCAL REPAIR  2006, 2012  . NASAL SINUS SURGERY    . TONSILLECTOMY  1962  . UMBILICAL HERNIA REPAIR  2003    There were no vitals filed for this visit.  Subjective Assessment  - 12/06/17 1513    Subjective  Pt stated he is a little sore LBP pain scale 2-3/10 across lower back.    Pertinent History  Lt LE DVT (treated with eloquis), Rt SIJ pain, Bil Knee pain    How long can you sit comfortably?  unlimited (occasional Rt SIJ pain)    How long can you stand comfortably?  Able to stand comfortably for 30 (was 15-20 minutes)    How long can you walk comfortably?  Able to walk comfortably for 60 (was 60 minutes)    Patient Stated Goals  To be able to walk and stand throughuot the day without requiring his back brace to return to work    Currently in Pain?  Yes    Pain Score  3     Pain Location  Back    Pain Orientation  Lower    Pain Descriptors / Indicators  Sore    Pain Type  Chronic pain    Pain Onset  More than a month ago    Pain Frequency  Constant    Aggravating Factors   yardwork, weeding    Pain Relieving Factors  stretches, medicine    Effect of Pain on Daily Activities  some limitation         [facility] PT Assessment - 12/06/17 1509      Assessment   Medical Diagnosis  S/p TLIF L4-5    Referring Provider  Phylliss Bob, MD    Onset Date/Surgical Date  08/21/17    Hand Dominance  Right    Next MD Visit  12/23/17 12/18/17 apt scheduled for injection Rt SI    Prior Therapy  no      Precautions   Precautions  None      Observation/Other Assessments   Focus on Therapeutic Outcomes (FOTO)   53% limited was 42% limited      Functional Tests   Functional tests  Single leg stance      Single Leg Stance   Comments  Rt: 36"; Lt 37" max of 5 was Rt LE = 10 seconds, Lt LE = 3-6 seconds      Posture/Postural Control   Posture/Postural Control  Postural limitations    Postural Limitations  Rounded Shoulders;Forward head;Increased thoracic kyphosis;Decreased lumbar lordosis      ROM / Strength   AROM / PROM / Strength  AROM;Strength      AROM   AROM Assessment Site  Lumbar    Lumbar Flexion  51 degrees fingers 11in from floor was 44    Lumbar  Extension  25 was 13    Lumbar - Right Side Bend  23 was 10    Lumbar - Left Side Bend  18 was 10    Lumbar - Right Rotation  25% limitation was 8    Lumbar - Left Rotation  25% limited  was 6      Strength   Overall Strength Comments  Core/double leg raise test: 45.2 seconds was Core/double leg raise test: 18 seconds    Strength Assessment Site  Hip;Knee;Ankle    Right Hip Flexion  5/5 was 4+/5    Right Hip Extension  5/5 was 4+/5    Right Hip ABduction  5/5    Left Hip Flexion  5/5 was 4+/5    Left Hip Extension  4+/5 was 4+/5    Left Hip ABduction  5/5 was 4+/5    Right/Left Knee  Right;Left    Right Knee Flexion  5/5 was 4+/5    Right Knee Extension  5/5    Left Knee Flexion  5/5 was 4+/5    Left Knee Extension  5/5    Right Ankle Dorsiflexion  5/5    Right Ankle Plantar Flexion  5/5 20 heel raises    Left Ankle Dorsiflexion  5/5    Left Ankle Plantar Flexion  5/5 was 20 heel raises      Flexibility   Soft Tissue Assessment /Muscle Length  yes    Hamstrings  Lt LE: 90/148 degrees; Rt LE 90/150 degrees Bil LE: 90/140 degrees      Ambulation/Gait   Ambulation/Gait  Yes    Ambulation/Gait Assistance  7: Independent    Ambulation Distance (Feet)  586 Feet 2MWT    Assistive device  None    Gait Pattern  Within Functional Limits    Gait velocity  1.48                   [facility] Adult PT Treatment/Exercise - 12/06/17 0001      Lumbar Exercises: Standing   Functional Squats  10 reps;Limitations    Functional Squats Limitations  3D hip excursion  10x (mat behind for squat mechanics)  Forward Lunge  10 reps;Limitations    Other Standing Lumbar Exercises  lumbar excursion 10x each direction (flex/ext, Lt/Rt sidebend, Lt/Rt rot)    Other Standing Lumbar Exercises  vector stance 5x 10"; tandem stance on foam 2x 30" with palvo 15x each direction last set               PT Short Term Goals - 12/06/17 1528      PT SHORT TERM GOAL #1   Title  Patient will be  independent with HEP, updated PRN, to imrpove functional mobility, strength, and endurance.    Baseline  12/06/17:  Reports compliance with HEP daily    Status  Achieved      PT SHORT TERM GOAL #2   Title  Patient will improve bil LE strength testing to be 5/5 throughout for all groups to demonstrate imrpoved functional LE strngth.    Baseline  12/06/17: 5/5 except Lt hip extension at 4+/5    Status  Partially Met      PT SHORT TERM GOAL #3   Title  Patient will improve SLS for bil LE to = or > 15 seconds to improve balance during gait and stair mobility to reduce fall risk.    Baseline  12/06/17: 5 max Lt 37", Rt 36" max    Status  Achieved        PT Long Term Goals - 12/06/17 1544      PT LONG TERM GOAL #1   Title  Patient will improve SLS for bil LE to = or > 30 seconds to improve balance during gait and stair mobility to reduce fall risk.    Baseline  12/06/17: Rt 36", Lt 37" max of 5  (Pended)     Status  Achieved      PT LONG TERM GOAL #2   Title  Patient will improve lumbar ROM by 8 degrees or to WNL's and be pain free with AROM to improve mobility for daily work activities like bending/lifting.    Baseline  12/06/17: partly met, continues to be limited in rotation     Status  On-going      PT LONG TERM GOAL #3   Title  Patient will improve gait velocity during 2MWT to = or > 1.2 m/s to improve functional gait and walking to normal community ambulation and demonstrate improve walign tolerance/endurance for return to work.    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    Status  On-going      PT LONG TERM GOAL #5   Title  Patient will demosntrate correct floor to above head lifting mechanics as well as mechanics for lifting from/to  various heights to be able to return to work activitites.    Status  On-going            Plan - 12/06/17 1655    Clinical Impression Statement  Session focus on lumbar mobility, core stabiltiy to assist with balalnce and reviewed goals.  Pt making great progress with reports of compliance with HEP.  Pt presents with improved lumbar mobility and LE strengthening all 5/5 except for Lt hip extension with 4+/5.  Pt improving balance though does continues to have  deficits with balalnce activities.  Therex added forward lunges as progressing of LTG to progress from floor to standing, this was not assessed this session except for subjective input for ability.  Pt stated he feels he has improved 20-25% functional abilities.  Does continue to have some pain over Rt SI and c/o Bil knee impairements that he feels hard to assess back functional without including knee pain with FOTO score.      Rehab Potential  Good    PT Frequency  2x / week    PT Duration  6 weeks    PT Treatment/Interventions  ADLs/Self Care Home Management;Electrical Stimulation;Gait training;Functional mobility training;Stair training;Therapeutic activities;Therapeutic exercise;Balance training;Neuromuscular re-education;Patient/family education;Manual techniques;Passive range of motion;Dry needling;Scar mobilization    PT Next Visit Plan  Next session progress to proper lifting mechanics and lunges to progress floor to standing goal.  Continue core strengthening/TA activation with balance exercises and lumbar mobility.    PT Home Exercise Plan  Eval: SLR, bridge, heel raises; SLS       Patient will benefit from skilled therapeutic intervention in order to improve the following deficits and impairments:  Abnormal gait, Improper body mechanics, Pain, Impaired sensation, Postural dysfunction, Decreased mobility, Decreased activity tolerance, Decreased endurance, Decreased range of motion, Decreased strength, Decreased balance, Difficulty  walking, Impaired flexibility  Visit Diagnosis: Chronic bilateral low back pain, with sciatica presence unspecified  Abnormal posture  Other abnormalities of gait and mobility  Muscle weakness (generalized)     Problem List Patient Active Problem List   Diagnosis Date Noted  . Radiculopathy 08/21/2017  . Hypercholesterolemia 12/18/2012  . Arteriosclerotic cardiovascular disease (ASCVD)   . Hyperlipidemia   . Hypertension 09/22/2010  . ANXIETY 11/10/2009  . Gastroesophageal reflux disease 12/03/2008   Timothy Shaw, [place] ; [place]  [place] 12/06/2017, [place], [place], [address] Phone: [phone]   Fax:  [phone]  Name: Timothy Shaw MRN: [id number] Date of Birth: 1955/10/04

## 2017-12-10 ENCOUNTER — Ambulatory Visit (HOSPITAL_COMMUNITY): Payer: BLUE CROSS/BLUE SHIELD

## 2017-12-10 ENCOUNTER — Encounter (HOSPITAL_COMMUNITY): Payer: Self-pay

## 2017-12-10 DIAGNOSIS — G8929 Other chronic pain: Secondary | ICD-10-CM

## 2017-12-10 DIAGNOSIS — R2689 Other abnormalities of gait and mobility: Secondary | ICD-10-CM

## 2017-12-10 DIAGNOSIS — M6281 Muscle weakness (generalized): Secondary | ICD-10-CM

## 2017-12-10 DIAGNOSIS — R293 Abnormal posture: Secondary | ICD-10-CM

## 2017-12-10 DIAGNOSIS — M545 Low back pain: Principal | ICD-10-CM

## 2017-12-10 NOTE — Therapy (Signed)
[place], [place], [address] Phone: [phone]   Fax:  [phone]  Physical Therapy Treatment  Patient Details  Name: Timothy Shaw MRN: [id number] Date of Birth: November 17, 1955 Referring Provider: Phylliss Bob, MD   Encounter Date: 12/10/2017  PT End of Session - 12/10/17 0952    Visit Number  8    Number of Visits  13    Date for PT Re-Evaluation  12/26/17 Minireassess complete 12/06/17    Authorization Type  [employer] Blue [employer] (visit limit 30 per calender year; no authorization required)    Authorization Time Period  11/14/2017 - 12/26/17    Authorization - Visit Number  9    Authorization - Number of Visits  30    PT Start Time  0947    PT Stop Time  1028    PT Time Calculation (min)  41 min    Activity Tolerance  Patient tolerated treatment well    Behavior During Therapy  [facility] for tasks assessed/performed       Past Medical History:  Diagnosis Date  . Anxiety   . Anxiety disorder   . Arteriosclerotic cardiovascular disease (ASCVD)    with angina: 09/2009-100% proximal RCA; 50% proximal&60% mid LAD; 30-50% mid-Cx, normal EF  . Arthritis    Lyme disease in 10/2009 treated with antibiotics  . Chronic headaches   . Gastroesophageal reflux disease   . Hyperlipidemia    Total cholesterol-136, triglycerides of 89, HDL 48 and LDL of 70 in 03/2010  . Hypertension    Left ventricular hypertrophy  . PONV (postoperative nausea and vomiting)   . Tobacco abuse, in remission    20 pack years; mild airway obstruction on PFTs in 2010    Past Surgical History:  Procedure Laterality Date  . APPENDECTOMY  1970  . COLONOSCOPY  2008  . HERNIA REPAIR    . inguinal hernia repair    . JOINT REPLACEMENT     torn   . KNEE ARTHROSCOPY W/ MENISCAL REPAIR  2006, 2012  . NASAL SINUS SURGERY    . TONSILLECTOMY  1962  . UMBILICAL HERNIA REPAIR  2003    There were no vitals filed for this visit.  Subjective Assessment  - 12/10/17 0947    Subjective  Pt stated he has stiffness across lower back, Rt side down lateral Rt LE to knee starting at Rt SI, pain scale 3/10.  Reports increased ADLs around the house, completed power cleaning outdoors over weekend.    Pertinent History  Lt LE DVT (treated with eloquis), Rt SIJ pain, Bil Knee pain    Patient Stated Goals  To be able to walk and stand throughuot the day without requiring his back brace to return to work    Currently in Pain?  Yes    Pain Score  3     Pain Location  Back    Pain Orientation  Lower    Pain Descriptors / Indicators  Sore;Tightness    Pain Type  Chronic pain    Pain Onset  More than a month ago    Pain Frequency  Constant    Aggravating Factors   yardwork, weeding    Pain Relieving Factors  stretches, medicine    Effect of Pain on Daily Activities  some limitation                       [facility] Adult PT Treatment/Exercise - 12/10/17 0001  Lumbar Exercises: Stretches   Active Hamstring Stretch  30 seconds;Limitations;Right;Left;2 reps    Active Hamstring Stretch Limitations  supine       Lumbar Exercises: Standing   Functional Squats  10 reps;Limitations    Functional Squats Limitations  3D hip excursion  10x (mat behind for squat mechanics)    Lifting  From 12";From floor;to overhead;5 reps    Forward Lunge  15 reps 6in step    Other Standing Lumbar Exercises  lumbar excursion 10x each direction (flex/ext, Lt/Rt sidebend, Lt/Rt rot)    Other Standing Lumbar Exercises  vector stance 5x 5" on foam; tandem stance on foam 2x 30" with palvo 15x each direction last set      Lumbar Exercises: Quadruped   Opposite Arm/Leg Raise  Right arm/Left leg;Left arm/Right leg;10 reps;5 seconds               PT Short Term Goals - 12/06/17 1528      PT SHORT TERM GOAL #1   Title  Patient will be independent with HEP, updated PRN, to imrpove functional mobility, strength, and endurance.    Baseline  12/06/17:  Reports  compliance with HEP daily    Status  Achieved      PT SHORT TERM GOAL #2   Title  Patient will improve bil LE strength testing to be 5/5 throughout for all groups to demonstrate imrpoved functional LE strngth.    Baseline  12/06/17: 5/5 except Lt hip extension at 4+/5    Status  Partially Met      PT SHORT TERM GOAL #3   Title  Patient will improve SLS for bil LE to = or > 15 seconds to improve balance during gait and stair mobility to reduce fall risk.    Baseline  12/06/17: 5 max Lt 37", Rt 36" max    Status  Achieved        PT Long Term Goals - 12/06/17 1544      PT LONG TERM GOAL #1   Title  Patient will improve SLS for bil LE to = or > 30 seconds to improve balance during gait and stair mobility to reduce fall risk.    Baseline  12/06/17: Rt 36", Lt 37" max of 5  (Pended)     Status  Achieved      PT LONG TERM GOAL #2   Title  Patient will improve lumbar ROM by 8 degrees or to WNL's and be pain free with AROM to improve mobility for daily work activities like bending/lifting.    Baseline  12/06/17: partly met, continues to be limited in rotation     Status  On-going      PT LONG TERM GOAL #3   Title  Patient will improve gait velocity during 2MWT to = or > 1.2 m/s to improve functional gait and walking to normal community ambulation and demonstrate improve walign tolerance/endurance for return to work.    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    Status  On-going      PT LONG TERM GOAL #5   Title  Patient will demosntrate correct floor to above head lifting mechanics as well as mechanics for lifting from/to  various heights to be able to return to work activitites.    Status  On-going            Plan - 12/10/17 1014     Clinical Impression Statement  Progressed functional strengthening with introduction to proper lifting.  Pt able to verbalize and demonstrate improved mechanics with functional squats for proper lifting.  Educated on importance of keeping objects close to body to reduce stress on lumbar and hip musculature.  Continued with dynamic balalnce activities for core strengthening, some LOB required HHA with vector stance.  EOS reports of no pain, was limited by fatigue wiht activities.    Rehab Potential  Good    PT Frequency  2x / week    PT Duration  6 weeks    PT Treatment/Interventions  ADLs/Self Care Home Management;Electrical Stimulation;Gait training;Functional mobility training;Stair training;Therapeutic activities;Therapeutic exercise;Balance training;Neuromuscular re-education;Patient/family education;Manual techniques;Passive range of motion;Dry needling;Scar mobilization    PT Next Visit Plan  Next session progress to proper lifting mechanics and lunges to progress floor to standing goal.  Reduce height or transition lunges to floor next session. Trial with tall kneeling Palvo if knees able to tolerate. Continue core strengthening/TA activation with balance exercises and lumbar mobility.    PT Home Exercise Plan  Eval: SLR, bridge, heel raises; SLS       Patient will benefit from skilled therapeutic intervention in order to improve the following deficits and impairments:  Abnormal gait, Improper body mechanics, Pain, Impaired sensation, Postural dysfunction, Decreased mobility, Decreased activity tolerance, Decreased endurance, Decreased range of motion, Decreased strength, Decreased balance, Difficulty walking, Impaired flexibility  Visit Diagnosis: Chronic bilateral low back pain, with sciatica presence unspecified  Abnormal posture  Other abnormalities of gait and mobility  Muscle weakness (generalized)     Problem List Patient Active Problem List   Diagnosis Date Noted  .  Radiculopathy 08/21/2017  . Hypercholesterolemia 12/18/2012  . Arteriosclerotic cardiovascular disease (ASCVD)   . Hyperlipidemia   . Hypertension 09/22/2010  . ANXIETY 11/10/2009  . Gastroesophageal reflux disease 12/03/2008   Timothy Shaw, LPTA; CBIS [phone]  Timothy Shaw 12/10/2017, 10:30 AM  [place], [place], [address] Phone: [phone]   Fax:  [phone]  Name: Timothy Shaw MRN: [id number] Date of Birth: Apr 17, 1956

## 2017-12-12 ENCOUNTER — Encounter (HOSPITAL_COMMUNITY): Payer: Self-pay

## 2017-12-12 ENCOUNTER — Ambulatory Visit (HOSPITAL_COMMUNITY): Payer: BLUE CROSS/BLUE SHIELD

## 2017-12-12 DIAGNOSIS — G8929 Other chronic pain: Secondary | ICD-10-CM

## 2017-12-12 DIAGNOSIS — M545 Low back pain: Secondary | ICD-10-CM | POA: Diagnosis not present

## 2017-12-12 DIAGNOSIS — R2689 Other abnormalities of gait and mobility: Secondary | ICD-10-CM | POA: Diagnosis not present

## 2017-12-12 DIAGNOSIS — M6281 Muscle weakness (generalized): Secondary | ICD-10-CM

## 2017-12-12 DIAGNOSIS — R293 Abnormal posture: Secondary | ICD-10-CM | POA: Diagnosis not present

## 2017-12-12 NOTE — Therapy (Signed)
[place], [place], [address] Phone: [phone]   Fax:  [phone]  Physical Therapy Treatment  Patient Details  Name: Timothy Shaw MRN: [id number] Date of Birth: [birth year] Referring Provider: Phylliss Bob, MD   Encounter Date: 12/12/2017  PT End of Session - 12/12/17 1001    Visit Number  9    Number of Visits  13    Date for PT Re-Evaluation  12/26/17 minireassess 12/06/17    Authorization Type  [employer] (visit limit 30 per calender year; no authorization required)    Authorization Time Period  11/14/2017 - 12/26/17    Authorization - Visit Number  10    Authorization - Number of Visits  30    PT Start Time  0950    PT Stop Time  1034    PT Time Calculation (min)  44 min    Activity Tolerance  Patient tolerated treatment well    Behavior During Therapy  [facility] for tasks assessed/performed       Past Medical History:  Diagnosis Date  . Anxiety   . Anxiety disorder   . Arteriosclerotic cardiovascular disease (ASCVD)    with angina: 09/2009-100% proximal RCA; 50% proximal&60% mid LAD; 30-50% mid-Cx, normal EF  . Arthritis    Lyme disease in 10/2009 treated with antibiotics  . Chronic headaches   . Gastroesophageal reflux disease   . Hyperlipidemia    Total cholesterol-136, triglycerides of 89, HDL 48 and LDL of 70 in 03/2010  . Hypertension    Left ventricular hypertrophy  . PONV (postoperative nausea and vomiting)   . Tobacco abuse, in remission    20 pack years; mild airway obstruction on PFTs in 2010    Past Surgical History:  Procedure Laterality Date  . APPENDECTOMY  1970  . COLONOSCOPY  2008  . HERNIA REPAIR    . inguinal hernia repair    . JOINT REPLACEMENT     torn   . KNEE ARTHROSCOPY W/ MENISCAL REPAIR  2006, 2012  . NASAL SINUS SURGERY    . TONSILLECTOMY  1962  . UMBILICAL HERNIA REPAIR  2003    There were no vitals filed for this visit.  Subjective Assessment -  12/12/17 0953    Subjective  Pt stated he has tenderness across the lower back, pain scale 2/10    Patient Stated Goals  To be able to walk and stand throughuot the day without requiring his back brace to return to work    Currently in Pain?  Yes    Pain Score  2     Pain Location  Back    Pain Orientation  Lower;Left;Right    Pain Descriptors / Indicators  Tender;Sore    Pain Type  Chronic pain    Pain Onset  More than a month ago    Pain Frequency  Constant    Aggravating Factors   yardwork, weeding    Pain Relieving Factors  stretches, medicine    Effect of Pain on Daily Activities  some limitation                       [facility] Adult PT Treatment/Exercise - 12/12/17 0001      Posture/Postural Control   Posture/Postural Control  Postural limitations    Postural Limitations  Rounded Shoulders;Forward head;Increased thoracic kyphosis;Decreased lumbar lordosis      Lumbar Exercises: Standing   Functional Squats Limitations  3D hip  excursion  10x (mat behind for squat mechanics)    Lifting  From 12";From floor;to overhead;5 reps    Forward Lunge  15 reps    Other Standing Lumbar Exercises  lumbar excursion 10x each direction (flex/ext, Lt/Rt sidebend, Lt/Rt rot)    Other Standing Lumbar Exercises  vector stance 5x 10" on foam; purple theraband marching 5x 5" with shoulder extension       Lumbar Exercises: Quadruped   Other Quadruped Lumbar Exercises  tall kneeling palvo with RTB 15x    Other Quadruped Lumbar Exercises  tall kneeling to forward lunge position on mat 5x each LE; increased unsteadiness with Lt LE intermittent HHA               PT Short Term Goals - 12/06/17 1528      PT SHORT TERM GOAL #1   Title  Patient will be independent with HEP, updated PRN, to imrpove functional mobility, strength, and endurance.    Baseline  12/06/17:  Reports compliance with HEP daily    Status  Achieved      PT SHORT TERM GOAL #2   Title  Patient will improve bil LE  strength testing to be 5/5 throughout for all groups to demonstrate imrpoved functional LE strngth.    Baseline  12/06/17: 5/5 except Lt hip extension at 4+/5    Status  Partially Met      PT SHORT TERM GOAL #3   Title  Patient will improve SLS for bil LE to = or > 15 seconds to improve balance during gait and stair mobility to reduce fall risk.    Baseline  12/06/17: 5 max Lt 37", Rt 36" max    Status  Achieved        PT Long Term Goals - 12/06/17 1544      PT LONG TERM GOAL #1   Title  Patient will improve SLS for bil LE to = or > 30 seconds to improve balance during gait and stair mobility to reduce fall risk.    Baseline  12/06/17: Rt 36", Lt 37" max of 5  (Pended)     Status  Achieved      PT LONG TERM GOAL #2   Title  Patient will improve lumbar ROM by 8 degrees or to WNL's and be pain free with AROM to improve mobility for daily work activities like bending/lifting.    Baseline  12/06/17: partly met, continues to be limited in rotation     Status  On-going      PT LONG TERM GOAL #3   Title  Patient will improve gait velocity during 2MWT to = or > 1.2 m/s to improve functional gait and walking to normal community ambulation and demonstrate improve walign tolerance/endurance for return to work.    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    Status  On-going      PT LONG TERM GOAL #5   Title  Patient will demosntrate correct floor to above head lifting mechanics as well as mechanics for lifting from/to various heights to be able to return to work activitites.    Status  On-going  Plan - 12/12/17 1042    Clinical Impression Statement  Progressed functional strengthening with lifting mechanics and core strengthening.   Began tall kneeling exercises with transition to lunge position as progressing for floor to standing goal, noted instability with task Lt> Rt and intermittent HHA required.  Pt reports no increased pain, did c/o fatigue with balance activities.      Rehab Potential  Good    PT Frequency  2x / week    PT Duration  6 weeks    PT Treatment/Interventions  ADLs/Self Care Home Management;Electrical Stimulation;Gait training;Functional mobility training;Stair training;Therapeutic activities;Therapeutic exercise;Balance training;Neuromuscular re-education;Patient/family education;Manual techniques;Passive range of motion;Dry needling;Scar mobilization    PT Next Visit Plan  Next session progress to proper lifting mechanics and lunges to progress floor to standing goal.  Transition lunges to floor next session. Trial with tall kneeling Palvo if knees able to tolerate. Continue core strengthening/TA activation with balance exercises and lumbar mobility.    PT Home Exercise Plan  Eval: SLR, bridge, heel raises; SLS       Patient will benefit from skilled therapeutic intervention in order to improve the following deficits and impairments:  Abnormal gait, Improper body mechanics, Pain, Impaired sensation, Postural dysfunction, Decreased mobility, Decreased activity tolerance, Decreased endurance, Decreased range of motion, Decreased strength, Decreased balance, Difficulty walking, Impaired flexibility  Visit Diagnosis: Chronic bilateral low back pain, with sciatica presence unspecified  Abnormal posture  Other abnormalities of gait and mobility  Muscle weakness (generalized)     Problem List Patient Active Problem List   Diagnosis Date Noted  . Radiculopathy 08/21/2017  . Hypercholesterolemia 12/18/2012  . Arteriosclerotic cardiovascular disease (ASCVD)   . Hyperlipidemia   . Hypertension 09/22/2010  . ANXIETY 11/10/2009  . Gastroesophageal reflux disease 12/03/2008   Ihor Austin, LPTA;  [place]  Ald[place] Lento 12/12/2017, 10:47 AM  [place], [place], [address] Phone: [phone]   Fax:  [phone]  Name: Timothy Shaw MRN: [id number] Date of Birth: [birth year]

## 2017-12-17 ENCOUNTER — Ambulatory Visit (HOSPITAL_COMMUNITY): Payer: BLUE CROSS/BLUE SHIELD

## 2017-12-17 ENCOUNTER — Other Ambulatory Visit: Payer: Self-pay

## 2017-12-17 ENCOUNTER — Encounter (HOSPITAL_COMMUNITY): Payer: Self-pay

## 2017-12-17 DIAGNOSIS — M545 Low back pain: Principal | ICD-10-CM

## 2017-12-17 DIAGNOSIS — M6281 Muscle weakness (generalized): Secondary | ICD-10-CM

## 2017-12-17 DIAGNOSIS — R293 Abnormal posture: Secondary | ICD-10-CM | POA: Diagnosis not present

## 2017-12-17 DIAGNOSIS — G8929 Other chronic pain: Secondary | ICD-10-CM | POA: Diagnosis not present

## 2017-12-17 DIAGNOSIS — R2689 Other abnormalities of gait and mobility: Secondary | ICD-10-CM | POA: Diagnosis not present

## 2017-12-17 NOTE — Therapy (Signed)
[place], [place], [address] Phone: [phone]   Fax:  [phone]  Physical Therapy Treatment  Patient Details  Name: Timothy Shaw MRN: [id number] Date of Birth: 15-Jan-1956 Referring Provider: Phylliss Bob, MD   Encounter Date: 12/17/2017  PT End of Session - 12/17/17 0955    Visit Number  10    Number of Visits  13    Date for PT Re-Evaluation  12/26/17 minireassess 12/06/17    Authorization Type  [employer] (visit limit 30 per calender year; no authorization required)    Authorization Time Period  11/14/2017 - 12/26/17    Authorization - Visit Number  11    Authorization - Number of Visits  30    PT Start Time  0950    PT Stop Time  1029    PT Time Calculation (min)  39 min    Activity Tolerance  Patient tolerated treatment well    Behavior During Therapy  [facility] for tasks assessed/performed       Past Medical History:  Diagnosis Date  . Anxiety   . Anxiety disorder   . Arteriosclerotic cardiovascular disease (ASCVD)    with angina: 09/2009-100% proximal RCA; 50% proximal&60% mid LAD; 30-50% mid-Cx, normal EF  . Arthritis    Lyme disease in 10/2009 treated with antibiotics  . Chronic headaches   . Gastroesophageal reflux disease   . Hyperlipidemia    Total cholesterol-136, triglycerides of 89, HDL 48 and LDL of 70 in 03/2010  . Hypertension    Left ventricular hypertrophy  . PONV (postoperative nausea and vomiting)   . Tobacco abuse, in remission    20 pack years; mild airway obstruction on PFTs in 2010    Past Surgical History:  Procedure Laterality Date  . APPENDECTOMY  1970  . COLONOSCOPY  2008  . HERNIA REPAIR    . inguinal hernia repair    . JOINT REPLACEMENT     torn   . KNEE ARTHROSCOPY W/ MENISCAL REPAIR  2006, 2012  . NASAL SINUS SURGERY    . TONSILLECTOMY  1962  . UMBILICAL HERNIA REPAIR  2003    There were no vitals filed for this visit.  Subjective Assessment -  12/17/17 0956    Subjective  Patient reports he is doing well today and is going to his MD tomorow for an injection into his Rt SIJ. He reports minimal pain today around his Rt SIJ, about 2/10.    Pertinent History  Lt LE DVT (treated with eloquis), Rt SIJ pain, Bil Knee pain    Limitations  Sitting;Standing;Walking;House hold activities    Patient Stated Goals  To be able to walk and stand throughuot the day without requiring his back brace to return to work    Currently in Pain?  Yes    Pain Score  2     Pain Location  Back    Pain Orientation  Lower;Right    Pain Descriptors / Indicators  Aching;Tender    Pain Type  Chronic pain    Pain Onset  More than a month ago    Pain Frequency  Constant        [facility] Adult PT Treatment/Exercise - 12/17/17 0001      Balance Poses: Yoga   Warrior I  2 reps;30 seconds;Limitations    Warrior I Limitations  2x bil       Lumbar Exercises: Standing   Functional Squats  15 reps;Limitations  Functional Squats Limitations  2 sets    Forward Lunge  10 reps;Limitations    Forward Lunge Limitations  bil LE, 2 sets    Other Standing Lumbar Exercises  lumbar excursion 10x each direction (flex/ext, Lt/Rt sidebend, Lt/Rt rot)    Other Standing Lumbar Exercises  Squat at cybex system: 2x 15 reps, bicep curl and squat combined for lifting mechanics      Lumbar Exercises: Quadruped   Other Quadruped Lumbar Exercises  half kneeling, on foam with forward press using Green TB       Balance Exercises - 12/17/17 1007      Balance Exercises: Standing   SLS with Vectors  Foam/compliant surface;5 reps 5-10 seconds, 3 ways, 5 reps bil LE        PT Education - 12/17/17 1030    Education Details  Educated to keep his appointment for this thursday and to call if his MD who does the injection for his SIJ tomrorow wants him to wait on therapy.    Person(s) Educated  Patient    Methods  Explanation;Demonstration    Comprehension  Verbalized  understanding;Returned demonstration       PT Short Term Goals - 12/06/17 1528      PT SHORT TERM GOAL #1   Title  Patient will be independent with HEP, updated PRN, to imrpove functional mobility, strength, and endurance.    Baseline  12/06/17:  Reports compliance with HEP daily    Status  Achieved      PT SHORT TERM GOAL #2   Title  Patient will improve bil LE strength testing to be 5/5 throughout for all groups to demonstrate imrpoved functional LE strngth.    Baseline  12/06/17: 5/5 except Lt hip extension at 4+/5    Status  Partially Met      PT SHORT TERM GOAL #3   Title  Patient will improve SLS for bil LE to = or > 15 seconds to improve balance during gait and stair mobility to reduce fall risk.    Baseline  12/06/17: 5 max Lt 37", Rt 36" max    Status  Achieved        PT Long Term Goals - 12/06/17 1544      PT LONG TERM GOAL #1   Title  Patient will improve SLS for bil LE to = or > 30 seconds to improve balance during gait and stair mobility to reduce fall risk.    Baseline  12/06/17: Rt 36", Lt 37" max of 5  (Pended)     Status  Achieved      PT LONG TERM GOAL #2   Title  Patient will improve lumbar ROM by 8 degrees or to WNL's and be pain free with AROM to improve mobility for daily work activities like bending/lifting.    Baseline  12/06/17: partly met, continues to be limited in rotation     Status  On-going      PT LONG TERM GOAL #3   Title  Patient will improve gait velocity during 2MWT to = or > 1.2 m/s to improve functional gait and walking to normal community ambulation and demonstrate improve walign tolerance/endurance for return to work.    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    Status  On-going      PT LONG  TERM GOAL #5   Title  Patient will demosntrate correct floor to above head lifting mechanics as well as mechanics for lifting from/to various heights to be able to return to work activitites.    Status  On-going        Plan - 12/17/17 1031    Clinical Impression Statement  Continued with functional squat/lifting exercise and lunges/half kneeling activities to progress towards floor to stand transfer. Added squat with bicep curl for lifting mechanics, patient continues to require cues to deter excessive trunk flexion and promote neutral spine alignment for squats. He progressed SLS with vectors to compliant surface and required intermittent UE support. He will continue to benefit from skilled PT interventions to address impairments and progress towards goals.    Rehab Potential  Good    PT Frequency  2x / week    PT Duration  6 weeks    PT Treatment/Interventions  ADLs/Self Care Home Management;Electrical Stimulation;Gait training;Functional mobility training;Stair training;Therapeutic activities;Therapeutic exercise;Balance training;Neuromuscular re-education;Patient/family education;Manual techniques;Passive range of motion;Dry needling;Scar mobilization    PT Next Visit Plan  Continue with lifting mechanics and focus on neutral back alignment to deter excessive trunk flexion. Continue with lunges for floor to standing goal. Perform half kneeling activities for floor to stand transfer. Continue core strengthening/TA activation with balance exercises and lumbar mobility.    PT Home Exercise Plan  Eval: SLR, bridge, heel raises; SLS    Consulted and Agree with Plan of Care  Patient       Patient will benefit from skilled therapeutic intervention in order to improve the following deficits and impairments:  Abnormal gait, Improper body mechanics, Pain, Impaired sensation, Postural dysfunction, Decreased mobility, Decreased activity tolerance, Decreased endurance, Decreased range of motion, Decreased  strength, Decreased balance, Difficulty walking, Impaired flexibility  Visit Diagnosis: Chronic bilateral low back pain, with sciatica presence unspecified  Abnormal posture  Other abnormalities of gait and mobility  Muscle weakness (generalized)     Problem List Patient Active Problem List   Diagnosis Date Noted  . Radiculopathy 08/21/2017  . Hypercholesterolemia 12/18/2012  . Arteriosclerotic cardiovascular disease (ASCVD)   . Hyperlipidemia   . Hypertension 09/22/2010  . ANXIETY 11/10/2009  . Gastroesophageal reflux disease 12/03/2008    Kipp Brood, PT, DPT Physical Therapist with [facility]  12/17/2017 10:36 AM    [place], [place], [address] Phone: [phone]   Fax:  [phone]  Name: Timothy Shaw MRN: [id number] Date of Birth: 07-29-1955

## 2017-12-18 ENCOUNTER — Telehealth (HOSPITAL_COMMUNITY): Payer: Self-pay | Admitting: Pulmonary Disease

## 2017-12-18 DIAGNOSIS — M25562 Pain in left knee: Secondary | ICD-10-CM | POA: Diagnosis not present

## 2017-12-18 DIAGNOSIS — M25561 Pain in right knee: Secondary | ICD-10-CM | POA: Diagnosis not present

## 2017-12-18 DIAGNOSIS — M533 Sacrococcygeal disorders, not elsewhere classified: Secondary | ICD-10-CM | POA: Diagnosis not present

## 2017-12-18 NOTE — Telephone Encounter (Signed)
pt had an injection and cx this appt and wanted to come Friday or Monday but right now nothing available.   I told him I would add to the wait list   12/18/17

## 2017-12-19 ENCOUNTER — Ambulatory Visit (HOSPITAL_COMMUNITY): Payer: BLUE CROSS/BLUE SHIELD

## 2017-12-23 ENCOUNTER — Ambulatory Visit (HOSPITAL_COMMUNITY): Payer: BLUE CROSS/BLUE SHIELD

## 2017-12-23 ENCOUNTER — Other Ambulatory Visit: Payer: Self-pay

## 2017-12-23 ENCOUNTER — Encounter

## 2017-12-23 ENCOUNTER — Encounter (HOSPITAL_COMMUNITY): Payer: Self-pay

## 2017-12-23 DIAGNOSIS — M533 Sacrococcygeal disorders, not elsewhere classified: Secondary | ICD-10-CM | POA: Diagnosis not present

## 2017-12-23 DIAGNOSIS — M545 Low back pain: Secondary | ICD-10-CM | POA: Diagnosis not present

## 2017-12-23 DIAGNOSIS — G8929 Other chronic pain: Secondary | ICD-10-CM | POA: Diagnosis not present

## 2017-12-23 DIAGNOSIS — R293 Abnormal posture: Secondary | ICD-10-CM

## 2017-12-23 DIAGNOSIS — M6281 Muscle weakness (generalized): Secondary | ICD-10-CM | POA: Diagnosis not present

## 2017-12-23 DIAGNOSIS — R2689 Other abnormalities of gait and mobility: Secondary | ICD-10-CM

## 2017-12-23 NOTE — Therapy (Signed)
[place], [place], [address] Phone: [phone]   Fax:  [phone]  Physical Therapy Treatment/Discharge Summary  Patient Details  Name: Timothy Shaw MRN: [id number] Date of Birth: 09/05/1955 Referring Provider: Phylliss Bob, MD   Encounter Date: 12/23/2017  PHYSICAL THERAPY DISCHARGE SUMMARY  Visits from Start of Care: 11  Current functional level related to goals / functional outcomes: Re-assessment performed today. Patient has met all short term and long term goals at this time. He has demonstrated much improved LE strength and improved squat form/lifting mechanics to be able to return to work with decreased risk of injury or back pain exacerbation. Patient was instructed on updated HEP to continue progressing independently following discharge and educated on remaining visits for 2019 as he has expressed knee pain and goal to reduce knee pain prior to returning to work as well. He has demonstrated good compliance with HEP and verbalized feeling confident he can continue with exercises at home. He will be discharged this session based on current functional status and meeting rehab goals.    Remaining deficits: See below details.   Education / Equipment: Educated on updated HEP to continue progress independently. Educated on readiness for discharge and on visit limit annually. Discussed discharging at current status and readinees to return for work when MD feels appropriate. Edcuated on abilty to use remaining visits to address knee pain if patient continues to have difficulty with knees.   Plan: Patient agrees to discharge.  Patient goals were met. Patient is being discharged due to meeting the stated rehab goals.  ?????      PT End of Session - 12/23/17 0813    Visit Number  11    Number of Visits  13    Date for PT Re-Evaluation  12/26/17 minireassess 12/06/17    Authorization Type  [employer]  (visit limit 30 per calender year; no authorization required)    Authorization Time Period  11/14/2017 - 12/26/17    Authorization - Visit Number  12    Authorization - Number of Visits  30    PT Start Time  0815    PT Stop Time  0900    PT Time Calculation (min)  45 min    Activity Tolerance  Patient tolerated treatment well    Behavior During Therapy  WFL for tasks assessed/performed       Past Medical History:  Diagnosis Date  . Anxiety   . Anxiety disorder   . Arteriosclerotic cardiovascular disease (ASCVD)    with angina: 09/2009-100% proximal RCA; 50% proximal&60% mid LAD; 30-50% mid-Cx, normal EF  . Arthritis    Lyme disease in 10/2009 treated with antibiotics  . Chronic headaches   . Gastroesophageal reflux disease   . Hyperlipidemia    Total cholesterol-136, triglycerides of 89, HDL 48 and LDL of 70 in 03/2010  . Hypertension    Left ventricular hypertrophy  . PONV (postoperative nausea and vomiting)   . Tobacco abuse, in remission    20 pack years; mild airway obstruction on PFTs in 2010    Past Surgical History:  Procedure Laterality Date  . APPENDECTOMY  1970  . COLONOSCOPY  2008  . HERNIA REPAIR    . inguinal hernia repair    . JOINT REPLACEMENT     torn   . KNEE ARTHROSCOPY W/ MENISCAL REPAIR  2006, 2012  . NASAL SINUS SURGERY    . TONSILLECTOMY  1962  .  UMBILICAL HERNIA REPAIR  2003    There were no vitals filed for this visit.  Subjective Assessment - 12/23/17 0814    Subjective  Patient reports he did a lot of painting this weekend and believes it may have flared up the center of his back some. He reports he had his injection last week into the Rt SIJ and it helped for a day but is about the same now. He has a follow up with his MD today at [facility] regarding his lumbar surgery.     Pertinent History  Lt LE DVT (treated with eloquis), Rt SIJ pain, Bil Knee pain    Limitations  Sitting;Standing;Walking;House hold activities    Patient Stated Goals  To be able  to walk and stand throughuot the day without requiring his back brace to return to work    Currently in Pain?  Yes    Pain Score  3     Pain Location  Back    Pain Orientation  Lower    Pain Descriptors / Indicators  Aching    Pain Type  Chronic pain    Pain Onset  More than a month ago    Pain Frequency  Constant    Aggravating Factors   getting up and down    Pain Relieving Factors  stretches    Effect of Pain on Daily Activities  some limitation         [facility] PT Assessment - 12/23/17 0001      Assessment   Medical Diagnosis  S/p TLIF L4-5    Referring Provider  Phylliss Bob, MD    Onset Date/Surgical Date  08/21/17    Hand Dominance  Right    Next MD Visit  12/23/17    Prior Therapy  no      Precautions   Precautions  None      Restrictions   Weight Bearing Restrictions  No      Prior Function   Level of Independence  Independent      Cognition   Overall Cognitive Status  Within Functional Limits for tasks assessed      Observation/Other Assessments   Focus on Therapeutic Outcomes (FOTO)   37% limited was 42% limited on 11/14/17 and 53% limited on 12/06/17      Functional Tests   Functional tests  Squat;Floor to Stand;Other      Squat   Comments  10 reps, no increased pain, good form with knee and hip flexion, no excessive trunk flexion with improved neutral spine posture      Floor to Stand   Comments  2 repetitions wiht pressup from LE thigh, patient denied pain with transfer, performed from half kneeling       Other:   Other/ Comments  Lifting Mechanics: floor to chest/overhead lifting with good form to maintain short lever arm, 10 reps with 10lb weighted box      Strength   Right Hip Flexion  5/5    Right Hip Extension  5/5    Right Hip ABduction  5/5    Left Hip Flexion  5/5    Left Hip Extension  5/5    Left Hip ABduction  5/5    Right Knee Flexion  5/5    Right Knee Extension  5/5    Left Knee Flexion  5/5    Left Knee Extension  5/5    Right  Ankle Dorsiflexion  5/5    Right Ankle Plantar Flexion  5/5  Left Ankle Dorsiflexion  5/5    Left Ankle Plantar Flexion  5/5      Ambulation/Gait   Gait velocity  -- was 1.48 m/s on 12/06/17       [facility] Adult PT Treatment/Exercise - 12/23/17 0001      Lumbar Exercises: Standing   Functional Squats  15 reps;Limitations    Wall Slides  10 reps;5 seconds;Limitations    Wall Slides Limitations  mini-squats    Other Standing Lumbar Exercises  lumbar excursion 10x each direction (flex/ext, Lt/Rt sidebend, Lt/Rt rot)    Other Standing Lumbar Exercises  Side step wtih Red TB at knees, 3x RT 15 feet        PT Education - 12/23/17 1257    Education Details  Educated on updated HEP to continue progress independently. Educated on readiness for discharge and on visit limit annually. Discussed discharging at current status and readinees to return for work when MD feels appropriate. Edcuated on abilty to use remaining visits to address knee pain if patient continues to have difficulty with knees.     Person(s) Educated  Patient    Methods  Explanation;Demonstration    Comprehension  Verbalized understanding;Returned demonstration       PT Short Term Goals - 12/23/17 0825      PT SHORT TERM GOAL #1   Title  Patient will be independent with HEP, updated PRN, to imrpove functional mobility, strength, and endurance.    Baseline  12/06/17:  Reports compliance with HEP daily    Status  Achieved      PT SHORT TERM GOAL #2   Title  Patient will improve bil LE strength testing to be 5/5 throughout for all groups to demonstrate imrpoved functional LE strngth.    Baseline  12/06/17: 5/5 except Lt hip extension at 4+/5    Status  Achieved      PT SHORT TERM GOAL #3   Title  Patient will improve SLS for bil LE to = or > 15 seconds to improve balance during gait and stair mobility to reduce fall risk.    Baseline  12/06/17: 5 max Lt 37", Rt 36" max    Status  Achieved        PT Long Term Goals -  12/23/17 0825      PT LONG TERM GOAL #1   Title  Patient will improve SLS for bil LE to = or > 30 seconds to improve balance during gait and stair mobility to reduce fall risk.    Baseline  12/06/17: Rt 36", Lt 37" max of 5    Status  Achieved      PT LONG TERM GOAL #2   Title  Patient will improve lumbar ROM by 8 degrees or to WNL's and be pain free with AROM to improve mobility for daily work activities like bending/lifting.    Baseline  12/06/17: partly met, continues to be limited in rotation     Status  Achieved      PT LONG TERM GOAL #3   Title  Patient will improve gait velocity during 2MWT to = or > 1.2 m/s to improve functional gait and walking to normal community ambulation and demonstrate improve walign tolerance/endurance for return to work.    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    Status  Achieved      PT LONG TERM GOAL #5   Title  Patient will demosntrate correct floor to above head lifting mechanics as well as mechanics for lifting from/to various heights to be able to return to work activitites.    Status  Achieved        Plan - 12/23/17 0813    Clinical Impression Statement  Re-assessment performed today. Patient has met all short term and long term goals at this time. He has demonstrated much improved LE strength and improved squat form/lifting mechanics to be able to return to work with decreased risk of injury or back pain exacerbation. Patient was instructed on updated HEP to continue progressing independently following discharge and educated on remaining visits for 2019 as he has expressed knee pain and goal to reduce knee pain prior to returning to work as well. He has demonstrated good compliance with HEP and  verbalized feeling confident he can continue with exercises at home. He will be discharged this session based on current functional status and meeting rehab goals.     Rehab Potential  Good    PT Frequency  2x / week    PT Duration  6 weeks    PT Treatment/Interventions  ADLs/Self Care Home Management;Electrical Stimulation;Gait training;Functional mobility training;Stair training;Therapeutic activities;Therapeutic exercise;Balance training;Neuromuscular re-education;Patient/family education;Manual techniques;Passive range of motion;Dry needling;Scar mobilization    PT Next Visit Plan  Discharge    PT Home Exercise Plan  Eval: SLR, bridge, heel raises; SLS; 12/23/17 - side step wtih TB, wall slides, squats, lumbar excursions    Consulted and Agree with Plan of Care  Patient       Patient will benefit from skilled therapeutic intervention in order to improve the following deficits and impairments:  Abnormal gait, Improper body mechanics, Pain, Impaired sensation, Postural dysfunction, Decreased mobility, Decreased activity tolerance, Decreased endurance, Decreased range of motion, Decreased strength, Decreased balance, Difficulty walking, Impaired flexibility  Visit Diagnosis: Chronic bilateral low back pain, with sciatica presence unspecified  Abnormal posture  Other abnormalities of gait and mobility  Muscle weakness (generalized)     Problem List Patient Active Problem List   Diagnosis Date Noted  . Radiculopathy 08/21/2017  . Hypercholesterolemia 12/18/2012  . Arteriosclerotic cardiovascular disease (ASCVD)   . Hyperlipidemia   . Hypertension 09/22/2010  . ANXIETY 11/10/2009  . Gastroesophageal reflux disease 12/03/2008    Kipp Brood, PT, DPT Physical Therapist with [facility]  12/23/2017 1:06 PM    [facility] [address] [place], [place], [address] Phone: [phone]   Fax:   [phone]  Name: WESTIN KNOTTS MRN: [id number] Date of Birth: 1955/07/28

## 2017-12-24 ENCOUNTER — Ambulatory Visit (HOSPITAL_COMMUNITY): Payer: BLUE CROSS/BLUE SHIELD

## 2017-12-24 ENCOUNTER — Encounter (HOSPITAL_COMMUNITY): Payer: BLUE CROSS/BLUE SHIELD

## 2017-12-30 ENCOUNTER — Other Ambulatory Visit: Payer: Self-pay | Admitting: Cardiovascular Disease

## 2017-12-30 DIAGNOSIS — M25561 Pain in right knee: Secondary | ICD-10-CM | POA: Diagnosis not present

## 2017-12-30 DIAGNOSIS — M25562 Pain in left knee: Secondary | ICD-10-CM | POA: Diagnosis not present

## 2017-12-30 DIAGNOSIS — M533 Sacrococcygeal disorders, not elsewhere classified: Secondary | ICD-10-CM | POA: Diagnosis not present

## 2018-01-21 DIAGNOSIS — M47818 Spondylosis without myelopathy or radiculopathy, sacral and sacrococcygeal region: Secondary | ICD-10-CM | POA: Diagnosis not present

## 2018-01-21 DIAGNOSIS — M47816 Spondylosis without myelopathy or radiculopathy, lumbar region: Secondary | ICD-10-CM | POA: Diagnosis not present

## 2018-01-24 DIAGNOSIS — M533 Sacrococcygeal disorders, not elsewhere classified: Secondary | ICD-10-CM | POA: Diagnosis not present

## 2018-01-27 ENCOUNTER — Other Ambulatory Visit (HOSPITAL_COMMUNITY): Payer: Self-pay | Admitting: Orthopedic Surgery

## 2018-01-27 DIAGNOSIS — M533 Sacrococcygeal disorders, not elsewhere classified: Secondary | ICD-10-CM

## 2018-01-29 ENCOUNTER — Other Ambulatory Visit: Payer: Self-pay | Admitting: Cardiovascular Disease

## 2018-02-18 DIAGNOSIS — M47816 Spondylosis without myelopathy or radiculopathy, lumbar region: Secondary | ICD-10-CM | POA: Diagnosis not present

## 2018-02-18 DIAGNOSIS — M47818 Spondylosis without myelopathy or radiculopathy, sacral and sacrococcygeal region: Secondary | ICD-10-CM | POA: Diagnosis not present

## 2018-03-07 DIAGNOSIS — M170 Bilateral primary osteoarthritis of knee: Secondary | ICD-10-CM | POA: Diagnosis not present

## 2018-03-07 DIAGNOSIS — M47816 Spondylosis without myelopathy or radiculopathy, lumbar region: Secondary | ICD-10-CM | POA: Diagnosis not present

## 2018-03-17 ENCOUNTER — Ambulatory Visit (HOSPITAL_COMMUNITY)
Admission: RE | Admit: 2018-03-17 | Discharge: 2018-03-17 | Disposition: A | Discharge status: Home / Self Care | Payer: BLUE CROSS/BLUE SHIELD | Source: Ambulatory Visit | Attending: Orthopedic Surgery | Admitting: Orthopedic Surgery

## 2018-03-17 DIAGNOSIS — M533 Sacrococcygeal disorders, not elsewhere classified: Secondary | ICD-10-CM | POA: Insufficient documentation

## 2018-03-17 DIAGNOSIS — I700 Atherosclerosis of aorta: Secondary | ICD-10-CM | POA: Insufficient documentation

## 2018-03-17 DIAGNOSIS — Z981 Arthrodesis status: Secondary | ICD-10-CM | POA: Diagnosis not present

## 2018-03-17 DIAGNOSIS — R102 Pelvic and perineal pain: Secondary | ICD-10-CM | POA: Diagnosis not present

## 2018-03-21 DIAGNOSIS — M545 Low back pain: Secondary | ICD-10-CM | POA: Diagnosis not present

## 2018-03-21 DIAGNOSIS — M1711 Unilateral primary osteoarthritis, right knee: Secondary | ICD-10-CM | POA: Diagnosis not present

## 2018-03-21 DIAGNOSIS — M1712 Unilateral primary osteoarthritis, left knee: Secondary | ICD-10-CM | POA: Diagnosis not present

## 2018-03-31 DIAGNOSIS — M170 Bilateral primary osteoarthritis of knee: Secondary | ICD-10-CM | POA: Diagnosis not present

## 2018-04-08 ENCOUNTER — Other Ambulatory Visit: Payer: Self-pay | Admitting: Orthopedic Surgery

## 2018-04-08 DIAGNOSIS — G8929 Other chronic pain: Secondary | ICD-10-CM

## 2018-04-08 DIAGNOSIS — M545 Low back pain, unspecified: Secondary | ICD-10-CM

## 2018-04-11 DIAGNOSIS — M1712 Unilateral primary osteoarthritis, left knee: Secondary | ICD-10-CM | POA: Diagnosis not present

## 2018-04-11 DIAGNOSIS — M1711 Unilateral primary osteoarthritis, right knee: Secondary | ICD-10-CM | POA: Diagnosis not present

## 2018-04-16 ENCOUNTER — Ambulatory Visit
Admission: RE | Admit: 2018-04-16 | Discharge: 2018-04-16 | Disposition: A | Discharge status: Home / Self Care | Payer: BLUE CROSS/BLUE SHIELD | Source: Ambulatory Visit | Attending: Orthopedic Surgery | Admitting: Orthopedic Surgery

## 2018-04-16 DIAGNOSIS — G8929 Other chronic pain: Secondary | ICD-10-CM

## 2018-04-16 DIAGNOSIS — M533 Sacrococcygeal disorders, not elsewhere classified: Secondary | ICD-10-CM | POA: Diagnosis not present

## 2018-04-16 DIAGNOSIS — M545 Low back pain: Principal | ICD-10-CM

## 2018-04-17 ENCOUNTER — Ambulatory Visit
Admission: RE | Admit: 2018-04-17 | Discharge: 2018-04-17 | Disposition: A | Discharge status: Home / Self Care | Payer: BLUE CROSS/BLUE SHIELD | Source: Ambulatory Visit | Attending: Orthopedic Surgery | Admitting: Orthopedic Surgery

## 2018-04-17 DIAGNOSIS — M549 Dorsalgia, unspecified: Secondary | ICD-10-CM | POA: Diagnosis not present

## 2018-04-17 DIAGNOSIS — M545 Low back pain, unspecified: Secondary | ICD-10-CM

## 2018-04-17 DIAGNOSIS — G8929 Other chronic pain: Secondary | ICD-10-CM

## 2018-05-05 DIAGNOSIS — M533 Sacrococcygeal disorders, not elsewhere classified: Secondary | ICD-10-CM | POA: Diagnosis not present

## 2018-05-07 ENCOUNTER — Ambulatory Visit (HOSPITAL_COMMUNITY)
Admission: RE | Admit: 2018-05-07 | Discharge: 2018-05-07 | Disposition: A | Discharge status: Home / Self Care | Payer: BLUE CROSS/BLUE SHIELD | Source: Ambulatory Visit | Attending: Pulmonary Disease | Admitting: Pulmonary Disease

## 2018-05-07 ENCOUNTER — Other Ambulatory Visit (HOSPITAL_COMMUNITY): Payer: Self-pay | Admitting: Pulmonary Disease

## 2018-05-07 DIAGNOSIS — I82412 Acute embolism and thrombosis of left femoral vein: Secondary | ICD-10-CM | POA: Insufficient documentation

## 2018-05-07 DIAGNOSIS — M79605 Pain in left leg: Secondary | ICD-10-CM

## 2018-05-07 DIAGNOSIS — I824Z1 Acute embolism and thrombosis of unspecified deep veins of right distal lower extremity: Secondary | ICD-10-CM | POA: Diagnosis not present

## 2018-05-20 DIAGNOSIS — M47816 Spondylosis without myelopathy or radiculopathy, lumbar region: Secondary | ICD-10-CM | POA: Diagnosis not present

## 2018-06-03 IMAGING — RF DG C-ARM 61-120 MIN
1 series · 2 of 2 positions shown · non-contrast
Comparison: Lumbar spine radiograph 08/21/2017

CLINICAL DATA: L4-L5 TLIF

EXAM:
DG C-ARM 61-120 MIN; LUMBAR SPINE - 2-3 VIEW

[Series 1: run · 2 of 2 slices shown]
[im 1/2]
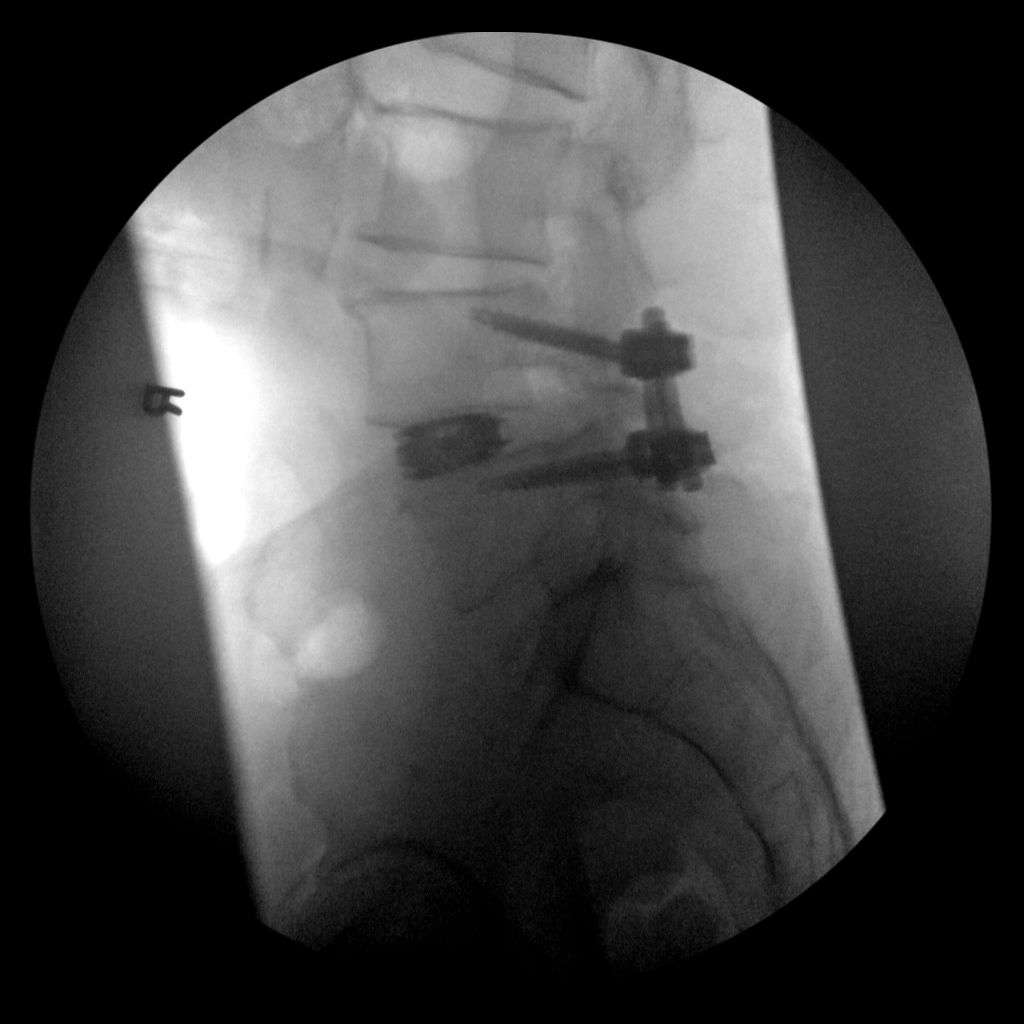
[im 2/2]
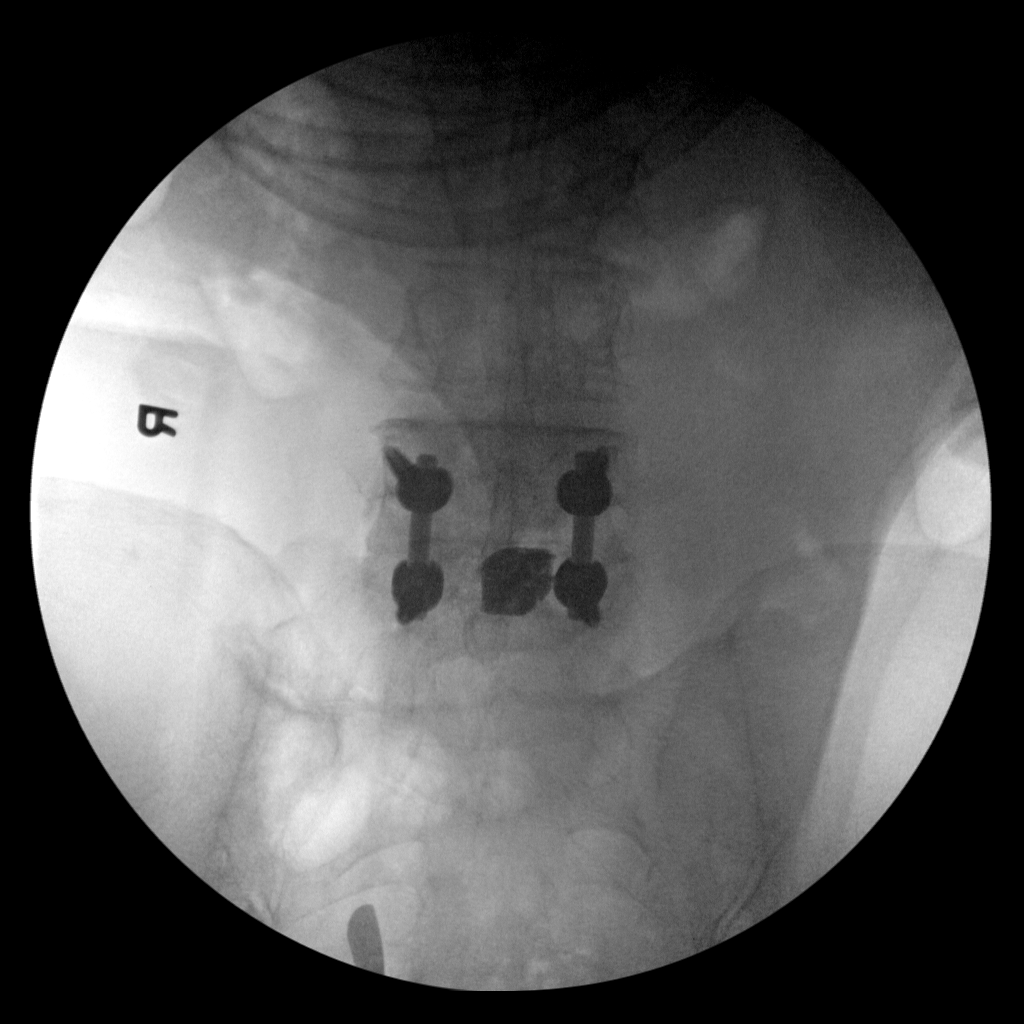

[2 of 2 positions shown; findings below may reference images not displayed]

FINDINGS: Two fluoroscopic images are provided demonstrating TLIF at L4-L5.
IMPRESSION: Intraoperative fluoroscopy.

## 2018-06-09 DIAGNOSIS — M47816 Spondylosis without myelopathy or radiculopathy, lumbar region: Secondary | ICD-10-CM | POA: Diagnosis not present

## 2018-06-17 DIAGNOSIS — I10 Essential (primary) hypertension: Secondary | ICD-10-CM | POA: Diagnosis not present

## 2018-06-17 DIAGNOSIS — E785 Hyperlipidemia, unspecified: Secondary | ICD-10-CM | POA: Diagnosis not present

## 2018-06-17 DIAGNOSIS — Z125 Encounter for screening for malignant neoplasm of prostate: Secondary | ICD-10-CM | POA: Diagnosis not present

## 2018-06-17 DIAGNOSIS — R739 Hyperglycemia, unspecified: Secondary | ICD-10-CM | POA: Diagnosis not present

## 2018-06-17 DIAGNOSIS — I2510 Atherosclerotic heart disease of native coronary artery without angina pectoris: Secondary | ICD-10-CM | POA: Diagnosis not present

## 2018-07-07 ENCOUNTER — Ambulatory Visit: Payer: BLUE CROSS/BLUE SHIELD | Admitting: Cardiovascular Disease

## 2018-07-07 ENCOUNTER — Encounter: Payer: Self-pay | Admitting: Cardiovascular Disease

## 2018-07-07 VITALS — BP 140/82 | HR 72 | Ht 74.0 in | Wt 228.0 lb

## 2018-07-07 DIAGNOSIS — Z86718 Personal history of other venous thrombosis and embolism: Secondary | ICD-10-CM

## 2018-07-07 DIAGNOSIS — M7989 Other specified soft tissue disorders: Secondary | ICD-10-CM

## 2018-07-07 DIAGNOSIS — E785 Hyperlipidemia, unspecified: Secondary | ICD-10-CM

## 2018-07-07 DIAGNOSIS — I10 Essential (primary) hypertension: Secondary | ICD-10-CM | POA: Diagnosis not present

## 2018-07-07 DIAGNOSIS — I25118 Atherosclerotic heart disease of native coronary artery with other forms of angina pectoris: Secondary | ICD-10-CM

## 2018-07-07 DIAGNOSIS — M79662 Pain in left lower leg: Secondary | ICD-10-CM

## 2018-07-07 NOTE — Progress Notes (Signed)
SUBJECTIVE: The patient presents for annual follow-up.  He has a history of coronary artery disease, hyperlipidemia, and hypertension.  He underwent L4-5 decompression with transforaminal lumbar interbody fusion and right-sided L4-5 posterior lateral fusion on 08/21/2017.  He was diagnosed with left popliteal DVT on 10/04/2017.  There was questionable nonocclusive thrombus in the left femoral vein at that time.  He was placed on Eliquis for period of time.  Lower extremity Dopplers on 11/05/2017 showed subacute and partially recanalized DVT in the femoral vein in the distal thigh.  There was resolution of the previously noted popliteal DVT.  He then developed left leg pain and swelling in December 2019.  He underwent Dopplers again on 05/07/2018 which showed new peroneal DVT on the left with persistent post thrombotic changes in the distal femoral vein.  He denies chest pain, palpitations, orthopnea, and shortness of breath.  When he had the DVT in December 2019, he had remained very active and does a lot of walking at work.  He does not exercise per se.  He was recently diagnosed with prediabetes and plans to modify his diet.  He continues to have left leg pain and it is swollen by the end of the day.   Review of Systems: As per "subjective", otherwise negative.  No Known Allergies  Current Outpatient Medications  Medication Sig Dispense Refill  . amLODipine (NORVASC) 5 MG tablet TAKE 1 TABLET BY MOUTH ONCE DAILY 90 tablet 3  . calcium carbonate (TUMS - DOSED IN MG ELEMENTAL CALCIUM) 500 MG chewable tablet Chew 1 tablet by mouth daily as needed for heartburn.     . Esomeprazole Magnesium 20 MG TBEC Take 20 mg by mouth daily before breakfast. Over the counter    . Meloxicam 5 MG CAPS Take by mouth once.    . metoprolol tartrate (LOPRESSOR) 50 MG tablet TAKE 1 TABLET BY MOUTH TWICE DAILY 180 tablet 3  . pravastatin (PRAVACHOL) 80 MG tablet TAKE 1 TABLET BY MOUTH ONCE DAILY 90 tablet  2   No current facility-administered medications for this visit.     Past Medical History:  Diagnosis Date  . Anxiety   . Anxiety disorder   . Arteriosclerotic cardiovascular disease (ASCVD)    with angina: 09/2009-100% proximal RCA; 50% proximal&60% mid LAD; 30-50% mid-Cx, normal EF  . Arthritis    Lyme disease in 10/2009 treated with antibiotics  . Chronic headaches   . Gastroesophageal reflux disease   . Hyperlipidemia    Total cholesterol-136, triglycerides of 89, HDL 48 and LDL of 70 in 03/2010  . Hypertension    Left ventricular hypertrophy  . PONV (postoperative nausea and vomiting)   . Tobacco abuse, in remission    20 pack years; mild airway obstruction on PFTs in 2010    Past Surgical History:  Procedure Laterality Date  . APPENDECTOMY  1970  . COLONOSCOPY  2008  . HERNIA REPAIR    . inguinal hernia repair    . JOINT REPLACEMENT     torn   . KNEE ARTHROSCOPY W/ MENISCAL REPAIR  2006, 2012  . NASAL SINUS SURGERY    . TONSILLECTOMY  1962  . UMBILICAL HERNIA REPAIR  2003    Social History   Socioeconomic History  . Marital status: Married    Spouse name: Not on file  . Number of children: 1  . Years of education: Not on file  . Highest education level: Not on file  Occupational History  . Occupation:  Maintenance technician  Social Needs  . Financial resource strain: Not on file  . Food insecurity:    Worry: Not on file    Inability: Not on file  . Transportation needs:    Medical: Not on file    Non-medical: Not on file  Tobacco Use  . Smoking status: Former Smoker    Packs/day: 1.00    Years: 20.00    Pack years: 20.00    Types: Cigarettes    Start date: 06/29/1971    Last attempt to quit: 06/04/1988    Years since quitting: 30.1  . Smokeless tobacco: Never Used  Substance and Sexual Activity  . Alcohol use: Yes    Alcohol/week: 1.0 standard drinks    Types: 1 Standard drinks or equivalent per week  . Drug use: No  . Sexual activity: Not on  file  Lifestyle  . Physical activity:    Days per week: Not on file    Minutes per session: Not on file  . Stress: Not on file  Relationships  . Social connections:    Talks on phone: Not on file    Gets together: Not on file    Attends religious service: Not on file    Active member of club or organization: Not on file    Attends meetings of clubs or organizations: Not on file    Relationship status: Not on file  . Intimate partner violence:    Fear of current or ex partner: Not on file    Emotionally abused: Not on file    Physically abused: Not on file    Forced sexual activity: Not on file  Other Topics Concern  . Not on file  Social History Narrative  . Not on file     Vitals:   07/07/18 1615  BP: 140/82  Pulse: 72  SpO2: 95%  Weight: 228 lb (103.4 kg)  Height: 6\' 2"  (1.88 m)    Wt Readings from Last 3 Encounters:  07/07/18 228 lb (103.4 kg)  08/21/17 224 lb (101.6 kg)  08/15/17 224 lb (101.6 kg)     PHYSICAL EXAM General: NAD HEENT: Normal. Neck: No JVD, no thyromegaly. Lungs: Clear to auscultation bilaterally with normal respiratory effort. CV: Regular rate and rhythm, normal S1/S2, no S3/S4, no murmur. No pretibial or periankle edema.  No carotid bruit.   Abdomen: Soft, nontender, no distention.  Neurologic: Alert and oriented.  Psych: Normal affect. Skin: Normal. Musculoskeletal: No gross deformities.    ECG: Reviewed above under Subjective   Labs: Lab Results  Component Value Date/Time   K 4.2 08/15/2017 03:25 PM   BUN 16 08/15/2017 03:25 PM   CREATININE 0.95 08/15/2017 03:25 PM   CREATININE 0.99 12/16/2012 08:20 AM   ALT 19 08/15/2017 03:25 PM   TSH 1.470 12/16/2012 08:20 AM   HGB 11.6 (L) 08/15/2017 03:25 PM     Lipids: Lab Results  Component Value Date/Time   LDLCALC 72 02/26/2014 08:48 AM   LDLCALC 82 01/17/2010 12:00 AM   CHOL 137 02/26/2014 08:48 AM   TRIG 94 02/26/2014 08:48 AM   TRIG 70 01/17/2010 12:00 AM   HDL 46  02/26/2014 08:48 AM       ASSESSMENT AND PLAN:  1.  Coronary artery disease: Symptomatically stable.  Continue metoprolol and pravastatin.  He takes 325 mg of aspirin 3-4 times per week.  I encouraged him to take 81 mg of aspirin daily instead.  2.  Hypertension: Blood pressure is mildly on present  therapy.  No changes to therapy for now.  3.  Hyperlipidemia: I will obtain a copy of lipids from his PCP.  Continue pravastatin.  4.  Left leg pain and swelling/DVT: I will obtain lower extremity Dopplers.  He follows up with his PCP on 07/17/2018 who has been managing recurrent DVT.  There is no family history of recurrent DVT.   Disposition: Follow up with me in 1 year.   Kate Sable, M.D., F.A.C.C.

## 2018-07-07 NOTE — Patient Instructions (Addendum)
Medication Instructions:   Take Aspirin 81 mg daily    If you need a refill on your cardiac medications before your next appointment, please call your pharmacy.   Lab work: None today If you have labs (blood work) drawn today and your tests are completely normal, you will receive your results only by: Marland Kitchen MyChart Message (if you have MyChart) OR . A paper copy in the mail If you have any lab test that is abnormal or we need to change your treatment, we will call you to review the results.  Testing/Procedures: Your physician has requested that you have a lower extremity venous duplex. This test is an ultrasound of the veins in the left leg. It looks at venous blood flow that carries blood from the heart to the legs or arms. Allow one hour for a Lower Venous exam. Allow thirty minutes for an Upper Venous exam. There are no restrictions or special instructions.     Follow-Up: At [facility], you and your health needs are our priority.  As part of our continuing mission to provide you with exceptional heart care, we have created designated Provider Care Teams.  These Care Teams include your primary Cardiologist (physician) and Advanced Practice Providers (APPs -  Physician Assistants and Nurse Practitioners) who all work together to provide you with the care you need, when you need it. You will need a follow up appointment in 12 months.  Please call our office 2 months in advance to schedule this appointment.  You may see Kate Sable, MD or one of the following Advanced Practice Providers on your designated Care Team:   Bernerd Pho, PA-C [facility]) . Ermalinda Barrios, PA-C ([place])  Any Other Special Instructions Will Be Listed Below (If Applicable). None

## 2018-07-08 DIAGNOSIS — M47816 Spondylosis without myelopathy or radiculopathy, lumbar region: Secondary | ICD-10-CM | POA: Diagnosis not present

## 2018-07-09 ENCOUNTER — Ambulatory Visit (HOSPITAL_COMMUNITY)
Admission: RE | Admit: 2018-07-09 | Discharge: 2018-07-09 | Disposition: A | Discharge status: Home / Self Care | Payer: BLUE CROSS/BLUE SHIELD | Source: Ambulatory Visit | Attending: Cardiovascular Disease | Admitting: Cardiovascular Disease

## 2018-07-09 DIAGNOSIS — Z86718 Personal history of other venous thrombosis and embolism: Secondary | ICD-10-CM

## 2018-07-09 DIAGNOSIS — R600 Localized edema: Secondary | ICD-10-CM | POA: Diagnosis not present

## 2018-07-11 ENCOUNTER — Ambulatory Visit (HOSPITAL_COMMUNITY): Payer: BLUE CROSS/BLUE SHIELD

## 2018-07-17 DIAGNOSIS — Z0000 Encounter for general adult medical examination without abnormal findings: Secondary | ICD-10-CM | POA: Diagnosis not present

## 2018-07-21 ENCOUNTER — Encounter (INDEPENDENT_AMBULATORY_CARE_PROVIDER_SITE_OTHER): Payer: Self-pay | Admitting: *Deleted

## 2018-07-21 DIAGNOSIS — E785 Hyperlipidemia, unspecified: Secondary | ICD-10-CM | POA: Diagnosis not present

## 2018-07-21 DIAGNOSIS — M159 Polyosteoarthritis, unspecified: Secondary | ICD-10-CM | POA: Diagnosis not present

## 2018-07-21 DIAGNOSIS — I10 Essential (primary) hypertension: Secondary | ICD-10-CM | POA: Diagnosis not present

## 2018-07-21 DIAGNOSIS — I82402 Acute embolism and thrombosis of unspecified deep veins of left lower extremity: Secondary | ICD-10-CM | POA: Diagnosis not present

## 2018-08-04 DIAGNOSIS — M47816 Spondylosis without myelopathy or radiculopathy, lumbar region: Secondary | ICD-10-CM | POA: Diagnosis not present

## 2018-08-07 DIAGNOSIS — M25562 Pain in left knee: Secondary | ICD-10-CM | POA: Diagnosis not present

## 2018-08-07 DIAGNOSIS — M1711 Unilateral primary osteoarthritis, right knee: Secondary | ICD-10-CM | POA: Diagnosis not present

## 2018-08-07 DIAGNOSIS — M1712 Unilateral primary osteoarthritis, left knee: Secondary | ICD-10-CM | POA: Diagnosis not present

## 2018-08-11 ENCOUNTER — Other Ambulatory Visit (INDEPENDENT_AMBULATORY_CARE_PROVIDER_SITE_OTHER): Payer: Self-pay | Admitting: *Deleted

## 2018-08-11 DIAGNOSIS — Z800 Family history of malignant neoplasm of digestive organs: Secondary | ICD-10-CM | POA: Insufficient documentation

## 2018-08-11 DIAGNOSIS — Z86010 Personal history of colonic polyps: Secondary | ICD-10-CM

## 2018-09-06 ENCOUNTER — Other Ambulatory Visit: Payer: Self-pay | Admitting: Cardiovascular Disease

## 2018-09-06 DIAGNOSIS — I10 Essential (primary) hypertension: Secondary | ICD-10-CM

## 2018-09-06 DIAGNOSIS — I2510 Atherosclerotic heart disease of native coronary artery without angina pectoris: Secondary | ICD-10-CM

## 2018-10-06 ENCOUNTER — Other Ambulatory Visit: Payer: Self-pay | Admitting: Cardiovascular Disease

## 2018-10-07 DIAGNOSIS — M47816 Spondylosis without myelopathy or radiculopathy, lumbar region: Secondary | ICD-10-CM | POA: Diagnosis not present

## 2018-11-03 ENCOUNTER — Telehealth (INDEPENDENT_AMBULATORY_CARE_PROVIDER_SITE_OTHER): Payer: Self-pay | Admitting: *Deleted

## 2018-11-03 ENCOUNTER — Encounter (INDEPENDENT_AMBULATORY_CARE_PROVIDER_SITE_OTHER): Payer: Self-pay | Admitting: *Deleted

## 2018-11-03 NOTE — Telephone Encounter (Signed)
Patient needs suprep 

## 2018-11-04 MED ORDER — SUPREP BOWEL PREP KIT 17.5-3.13-1.6 GM/177ML PO SOLN: 1.0000 | Freq: Once | ORAL | 0 refills | Status: AC

## 2018-11-11 ENCOUNTER — Telehealth (INDEPENDENT_AMBULATORY_CARE_PROVIDER_SITE_OTHER): Payer: Self-pay | Admitting: *Deleted

## 2018-11-11 NOTE — Telephone Encounter (Signed)
agree

## 2018-11-11 NOTE — Telephone Encounter (Signed)
Referring MD/PCP: hawkins   Procedure: tcs  Reason/Indication:  Hx polyps, fam hx colon ca  Has patient had this procedure before?  Yes, 5 yrs ago  If so, when, by whom and where?    Is there a family history of colon cancer?  Yes. sister  Who?  What age when diagnosed?    Is patient diabetic?   no      Does patient have prosthetic heart valve or mechanical valve?  no  Do you have a pacemaker?  no  Has patient ever had endocarditis? no  Has patient had joint replacement within last 12 months?  no  Is patient constipated or do they take laxatives? no  Does patient have a history of alcohol/drug use?  no  Is patient on blood thinner such as Coumadin, Plavix and/or Aspirin? yes  Medications: amlodipine 5 mg daily, pravastatin 80 mg daily, metoprolol 50 mg bid, meloxicam 15 mg daily, asa 81 mg daily, esomeprazole 20 mg daily  Allergies: nkda  Medication Adjustment per Dr Lindi Adie, NP: asa 2 das  Procedure date & time: 12/11/18 at 730

## 2018-12-08 ENCOUNTER — Other Ambulatory Visit (HOSPITAL_COMMUNITY)
Admission: RE | Admit: 2018-12-08 | Discharge: 2018-12-08 | Disposition: A | Discharge status: Home / Self Care | Payer: BC Managed Care – PPO | Source: Ambulatory Visit | Attending: Internal Medicine | Admitting: Internal Medicine

## 2018-12-08 ENCOUNTER — Other Ambulatory Visit: Payer: Self-pay

## 2018-12-08 DIAGNOSIS — Z1159 Encounter for screening for other viral diseases: Secondary | ICD-10-CM | POA: Insufficient documentation

## 2018-12-08 DIAGNOSIS — Z01812 Encounter for preprocedural laboratory examination: Secondary | ICD-10-CM | POA: Diagnosis not present

## 2018-12-08 LAB — NOVEL CORONAVIRUS, NAA: SARS-CoV-2, NAA: NOT DETECTED

## 2018-12-09 LAB — SARS CORONAVIRUS 2 (TAT 6-24 HRS): SARS Coronavirus 2: NEGATIVE

## 2018-12-11 ENCOUNTER — Other Ambulatory Visit: Payer: Self-pay

## 2018-12-11 ENCOUNTER — Ambulatory Visit (HOSPITAL_COMMUNITY)
Admission: RE | Admit: 2018-12-11 | Discharge: 2018-12-11 | Disposition: A | Discharge status: Home / Self Care | Payer: BC Managed Care – PPO | Attending: Internal Medicine | Admitting: Internal Medicine

## 2018-12-11 ENCOUNTER — Encounter (HOSPITAL_COMMUNITY): Payer: Self-pay | Admitting: *Deleted

## 2018-12-11 ENCOUNTER — Encounter (HOSPITAL_COMMUNITY)
Admission: RE | Disposition: A | Discharge status: Home / Self Care | Payer: Self-pay | Source: Home / Self Care | Attending: Internal Medicine

## 2018-12-11 DIAGNOSIS — K219 Gastro-esophageal reflux disease without esophagitis: Secondary | ICD-10-CM | POA: Diagnosis not present

## 2018-12-11 DIAGNOSIS — I2510 Atherosclerotic heart disease of native coronary artery without angina pectoris: Secondary | ICD-10-CM | POA: Diagnosis not present

## 2018-12-11 DIAGNOSIS — E785 Hyperlipidemia, unspecified: Secondary | ICD-10-CM | POA: Insufficient documentation

## 2018-12-11 DIAGNOSIS — Z87891 Personal history of nicotine dependence: Secondary | ICD-10-CM | POA: Diagnosis not present

## 2018-12-11 DIAGNOSIS — D122 Benign neoplasm of ascending colon: Secondary | ICD-10-CM | POA: Insufficient documentation

## 2018-12-11 DIAGNOSIS — I10 Essential (primary) hypertension: Secondary | ICD-10-CM | POA: Insufficient documentation

## 2018-12-11 DIAGNOSIS — Z79899 Other long term (current) drug therapy: Secondary | ICD-10-CM | POA: Diagnosis not present

## 2018-12-11 DIAGNOSIS — Z800 Family history of malignant neoplasm of digestive organs: Secondary | ICD-10-CM

## 2018-12-11 DIAGNOSIS — Z860100 Personal history of colon polyps, unspecified: Secondary | ICD-10-CM

## 2018-12-11 DIAGNOSIS — M1990 Unspecified osteoarthritis, unspecified site: Secondary | ICD-10-CM | POA: Diagnosis not present

## 2018-12-11 DIAGNOSIS — Z791 Long term (current) use of non-steroidal anti-inflammatories (NSAID): Secondary | ICD-10-CM | POA: Diagnosis not present

## 2018-12-11 DIAGNOSIS — Z7982 Long term (current) use of aspirin: Secondary | ICD-10-CM | POA: Diagnosis not present

## 2018-12-11 DIAGNOSIS — Z8249 Family history of ischemic heart disease and other diseases of the circulatory system: Secondary | ICD-10-CM | POA: Insufficient documentation

## 2018-12-11 DIAGNOSIS — Z1211 Encounter for screening for malignant neoplasm of colon: Secondary | ICD-10-CM | POA: Diagnosis not present

## 2018-12-11 HISTORY — PX: POLYPECTOMY: SHX5525

## 2018-12-11 HISTORY — PX: COLONOSCOPY: SHX5424

## 2018-12-11 LAB — HM COLONOSCOPY

## 2018-12-11 SURGERY — COLONOSCOPY
Anesthesia: Moderate Sedation

## 2018-12-11 MED ORDER — MIDAZOLAM HCL 5 MG/5ML IJ SOLN
INTRAMUSCULAR | Status: DC | PRN
  Administered 2018-12-11: 2 mg via INTRAVENOUS
  Administered 2018-12-11: 1 mg via INTRAVENOUS
  Administered 2018-12-11: 2 mg via INTRAVENOUS

## 2018-12-11 MED ORDER — SODIUM CHLORIDE 0.9 % IV SOLN
INTRAVENOUS | Status: DC
  Administered 2018-12-11: 07:00:00 via INTRAVENOUS

## 2018-12-11 MED ORDER — MEPERIDINE HCL 50 MG/ML IJ SOLN
INTRAMUSCULAR | Status: DC | PRN
  Administered 2018-12-11 (×2): 25 mg

## 2018-12-11 MED ORDER — MEPERIDINE HCL 50 MG/ML IJ SOLN
INTRAMUSCULAR | Status: AC
  Filled 2018-12-11: qty 1

## 2018-12-11 MED ORDER — STERILE WATER FOR IRRIGATION IR SOLN
Status: DC | PRN
  Administered 2018-12-11: 1.5 mL

## 2018-12-11 MED ORDER — MIDAZOLAM HCL 5 MG/5ML IJ SOLN
INTRAMUSCULAR | Status: AC
  Filled 2018-12-11: qty 10

## 2018-12-11 NOTE — Discharge Instructions (Signed)
Resume aspirin on 12/12/2018. Resume other medications as before. Resume usual diet. No driving for 24 hours. Physician will call with biopsy results. Next colonoscopy in 5 years.   Colonoscopy, Adult, Care After This sheet gives you information about how to care for yourself after your procedure. Your doctor may also give you more specific instructions. If you have problems or questions, call your doctor. What can I expect after the procedure? After the procedure, it is common to have:  A small amount of blood in your poop for 24 hours.  Some gas.  Mild cramping or bloating in your belly. Follow these instructions at home: General instructions  For the first 24 hours after the procedure: ? Do not drive or use machinery. ? Do not sign important documents. ? Do not drink alcohol. ? Do your daily activities more slowly than normal. ? Eat foods that are soft and easy to digest.  Take over-the-counter or prescription medicines only as told by your doctor. To help cramping and bloating:   Try walking around.  Put heat on your belly (abdomen) as told by your doctor. Use a heat source that your doctor recommends, such as a moist heat pack or a heating pad. ? Put a towel between your skin and the heat source. ? Leave the heat on for 20-30 minutes. ? Remove the heat if your skin turns bright red. This is especially important if you cannot feel pain, heat, or cold. You can get burned. Eating and drinking   Drink enough fluid to keep your pee (urine) clear or pale yellow.  Return to your normal diet as told by your doctor. Avoid heavy or fried foods that are hard to digest.  Avoid drinking alcohol for as long as told by your doctor. Contact a doctor if:  You have blood in your poop (stool) 2-3 days after the procedure. Get help right away if:  You have more than a small amount of blood in your poop.  You see large clumps of tissue (blood clots) in your poop.  Your belly is  swollen.  You feel sick to your stomach (nauseous).  You throw up (vomit).  You have a fever.  You have belly pain that gets worse, and medicine does not help your pain. Summary  After the procedure, it is common to have a small amount of blood in your poop. You may also have mild cramping and bloating in your belly.  For the first 24 hours after the procedure, do not drive or use machinery, do not sign important documents, and do not drink alcohol.  Get help right away if you have a lot of blood in your poop, feel sick to your stomach, have a fever, or have more belly pain. This information is not intended to replace advice given to you by your health care provider. Make sure you discuss any questions you have with your health care provider. Document Released: 06/23/2010 Document Revised: 03/21/2017 Document Reviewed: 02/13/2016 Elsevier Patient Education  2020 [employer].  Colon Polyps  Polyps are tissue growths inside the body. Polyps can grow in many places, including the large intestine (colon). A polyp may be a round bump or a mushroom-shaped growth. You could have one polyp or several. Most colon polyps are noncancerous (benign). However, some colon polyps can become cancerous over time. Finding and removing the polyps early can help prevent this. What are the causes? The exact cause of colon polyps is not known. What increases the risk? You  are more likely to develop this condition if you:  Have a family history of colon cancer or colon polyps.  Are older than 71 or older than 45 if you are African American.  Have inflammatory bowel disease, such as ulcerative colitis or Crohn's disease.  Have certain hereditary conditions, such as: ? Familial adenomatous polyposis. ? Lynch syndrome. ? Turcot syndrome. ? Peutz-Jeghers syndrome.  Are overweight.  Smoke cigarettes.  Do not get enough exercise.  Drink too much alcohol.  Eat a diet that is high in fat and red  meat and low in fiber.  Had childhood cancer that was treated with abdominal radiation. What are the signs or symptoms? Most polyps do not cause symptoms. If you have symptoms, they may include:  Blood coming from your rectum when having a bowel movement.  Blood in your stool. The stool may look dark red or black.  Abdominal pain.  A change in bowel habits, such as constipation or diarrhea. How is this diagnosed? This condition is diagnosed with a colonoscopy. This is a procedure in which a lighted, flexible scope is inserted into the anus and then passed into the colon to examine the area. Polyps are sometimes found when a colonoscopy is done as part of routine cancer screening tests. How is this treated? Treatment for this condition involves removing any polyps that are found. Most polyps can be removed during a colonoscopy. Those polyps will then be tested for cancer. Additional treatment may be needed depending on the results of testing. Follow these instructions at home: Lifestyle  Maintain a healthy weight, or lose weight if recommended by your health care provider.  Exercise every day or as told by your health care provider.  Do not use any products that contain nicotine or tobacco, such as cigarettes and e-cigarettes. If you need help quitting, ask your health care provider.  If you drink alcohol, limit how much you have: ? 0-1 drink a day for women. ? 0-2 drinks a day for men.  Be aware of how much alcohol is in your drink. In the [place] one drink equals one 12 oz bottle of beer (355 mL), one 5 oz glass of wine (148 mL), or one 1 oz shot of hard liquor (44 mL). Eating and drinking   Eat foods that are high in fiber, such as fruits, vegetables, and whole grains.  Eat foods that are high in calcium and vitamin D, such as milk, cheese, yogurt, eggs, liver, fish, and broccoli.  Limit foods that are high in fat, such as fried foods and desserts.  Limit the amount of red  meat and processed meat you eat, such as hot dogs, sausage, bacon, and lunch meats. General instructions  Keep all follow-up visits as told by your health care provider. This is important. ? This includes having regularly scheduled colonoscopies. ? Talk to your health care provider about when you need a colonoscopy. Contact a health care provider if:  You have new or worsening bleeding during a bowel movement.  You have new or increased blood in your stool.  You have a change in bowel habits.  You lose weight for no known reason. Summary  Polyps are tissue growths inside the body. Polyps can grow in many places, including the colon.  Most colon polyps are noncancerous (benign), but some can become cancerous over time.  This condition is diagnosed with a colonoscopy.  Treatment for this condition involves removing any polyps that are found. Most polyps can be removed  during a colonoscopy. This information is not intended to replace advice given to you by your health care provider. Make sure you discuss any questions you have with your health care provider. Document Released: 02/15/2004 Document Revised: 09/05/2017 Document Reviewed: 09/05/2017 Elsevier Patient Education  2020 [employer].

## 2018-12-11 NOTE — Op Note (Signed)
[facility] Patient Name: Timothy Shaw Procedure Date: 12/11/2018 7:29 AM MRN: [id number] Date of Birth: September 13, 1955 Attending MD: Hildred Laser , MD CSN: [id number] Age: 63 Admit Type: Outpatient Procedure:                Colonoscopy Indications:              Screening in patient at increased risk: Colorectal                            cancer in sister before age 4 Providers:                Hildred Laser, MD, Gerome Sam, RN, Nelma Rothman,                            Technician Referring MD:             Jasper Loser. Luan Pulling, MD Medicines:                Meperidine 50 mg IV, Midazolam 5 mg IV Complications:            No immediate complications. Estimated Blood Loss:     Estimated blood loss was minimal. Procedure:                Pre-Anesthesia Assessment:                           - Prior to the procedure, a History and Physical                            was performed, and patient medications and                            allergies were reviewed. The patient's tolerance of                            previous anesthesia was also reviewed. The risks                            and benefits of the procedure and the sedation                            options and risks were discussed with the patient.                            All questions were answered, and informed consent                            was obtained. Prior Anticoagulants: The patient has                            taken no previous anticoagulant or antiplatelet                            agents except for aspirin and has taken no previous  anticoagulant or antiplatelet agents except for                            NSAID medication. ASA Grade Assessment: II - A                            patient with mild systemic disease. After reviewing                            the risks and benefits, the patient was deemed in                            satisfactory condition to undergo the procedure.                      After obtaining informed consent, the colonoscope                            was passed under direct vision. Throughout the                            procedure, the patient's blood pressure, pulse, and                            oxygen saturations were monitored continuously. The                            [id number]-[id number] ([id number]) scope was introduced through                            the anus and advanced to the the cecum, identified                            by appendiceal orifice and ileocecal valve. The                            colonoscopy was performed without difficulty. The                            patient tolerated the procedure well. The quality                            of the bowel preparation was excellent. The                            ileocecal valve, appendiceal orifice, and rectum                            were photographed. Scope In: 7:42:09 AM Scope Out: 8:02:14 AM Scope Withdrawal Time: 0 hours 11 minutes 38 seconds  Total Procedure Duration: 0 hours 20 minutes 5 seconds  Findings:      The perianal and digital rectal examinations were normal.      A small polyp was found in the proximal ascending colon with eroded tip.  The polyp was sessile. Biopsies were taken with a cold forceps for       histology. The pathology specimen was placed into Bottle Number 1.      The exam was otherwise normal throughout the examined colon.      The retroflexed view of the distal rectum and anal verge was normal and       showed no anal or rectal abnormalities. Impression:               - One small polyp in the proximal ascending colon                            witheroded surface. Biopsied. Moderate Sedation:      Moderate (conscious) sedation was administered by the endoscopy nurse       and supervised by the endoscopist. The following parameters were       monitored: oxygen saturation, heart rate, blood pressure, CO2       capnography and response to  care. Total physician intraservice time was       26 minutes. Recommendation:           - Patient has a contact number available for                            emergencies. The signs and symptoms of potential                            delayed complications were discussed with the                            patient. Return to normal activities tomorrow.                            Written discharge instructions were provided to the                            patient.                           - Resume previous diet today.                           - Continue present medications.                           - No aspirin, ibuprofen, naproxen, or other                            non-steroidal anti-inflammatory drugs for 1 day.                           - Await pathology results.                           - Repeat colonoscopy in 5 years. Procedure Code(s):        --- Professional ---  14431, Colonoscopy, flexible; with biopsy, single                            or multiple                           99153, Moderate sedation; each additional 15                            minutes intraservice time                           G0500, Moderate sedation services provided by the                            same physician or other qualified health care                            professional performing a gastrointestinal                            endoscopic service that sedation supports,                            requiring the presence of an independent trained                            observer to assist in the monitoring of the                            patient's level of consciousness and physiological                            status; initial 15 minutes of intra-service time;                            patient age 76 years or older (additional time may                            be reported with [phone], as appropriate) Diagnosis Code(s):        --- Professional ---                            Z80.0, Family history of malignant neoplasm of                            digestive organs                           K63.5, Polyp of colon CPT copyright 2019 American [employer]. All rights reserved. The codes documented in this report are preliminary and upon coder review may  be revised to meet current compliance requirements. Hildred Laser, MD Hildred Laser, MD 12/11/2018 8:11:33 AM This report has been signed electronically. Number of Addenda: 0

## 2018-12-11 NOTE — H&P (Signed)
Timothy Shaw is an 63 y.o. male.   Chief Complaint: Patient is here for colonoscopy. HPI: Patient is 63 year old Caucasian male who is here for her screening colonoscopy.  He denies abdominal pain change in bowel habits or rectal bleeding.  He had colonoscopy in 2008 and again in 2013 and no abnormalities found. Family history significant for CRC in sister who was 11 at the time of diagnosis and died a year later age 61. No other family member has had colon cancer his father and brother have had colonic polyps.  Past Medical History:  Diagnosis Date  . Anxiety   . Anxiety disorder   . Arteriosclerotic cardiovascular disease (ASCVD)    with angina: 09/2009-100% proximal RCA; 50% proximal&60% mid LAD; 30-50% mid-Cx, normal EF  . Arthritis    Lyme disease in 10/2009 treated with antibiotics  . Chronic headaches   . Gastroesophageal reflux disease   . Hyperlipidemia    Total cholesterol-136, triglycerides of 89, HDL 48 and LDL of 70 in 03/2010  . Hypertension    Left ventricular hypertrophy  . PONV (postoperative nausea and vomiting)   . Tobacco abuse, in remission    20 pack years; mild airway obstruction on PFTs in 2010    Past Surgical History:  Procedure Laterality Date  . APPENDECTOMY  1970  . COLONOSCOPY  2008  . HERNIA REPAIR    . inguinal hernia repair    . KNEE ARTHROSCOPY W/ MENISCAL REPAIR  2006, 2012  . NASAL SINUS SURGERY    . TONSILLECTOMY  1962  . UMBILICAL HERNIA REPAIR  2003    Family History  Problem Relation Age of Onset  . Heart failure Other        conduction system disease requiring pacemaker  . Lupus Other        Sibling  . Colon cancer Other        Sibling  . Colon cancer Sister   . Breast cancer Sister   . Esophageal cancer Neg Hx   . Stomach cancer Neg Hx   . Rectal cancer Neg Hx    Social History:  reports that he quit smoking about 30 years ago. His smoking use included cigarettes. He started smoking about 47 years ago. He has a 20.00  pack-year smoking history. He has never used smokeless tobacco. He reports current alcohol use of about 1.0 standard drinks of alcohol per week. He reports that he does not use drugs.  Allergies: No Known Allergies  Medications Prior to Admission  Medication Sig Dispense Refill  . amLODipine (NORVASC) 5 MG tablet TAKE 1 TABLET BY MOUTH ONCE DAILY 90 tablet 3  . aspirin EC 81 MG tablet Take 81 mg by mouth daily.    Marland Kitchen esomeprazole (NEXIUM) 20 MG capsule Take 20 mg by mouth daily before breakfast. Over the counter    . Krill Oil 500 MG CAPS Take 500 mg by mouth daily.    . meloxicam (MOBIC) 15 MG tablet Take 15 mg by mouth daily.     . metoprolol tartrate (LOPRESSOR) 50 MG tablet TAKE 1 TABLET BY MOUTH TWICE DAILY 180 tablet 1  . pravastatin (PRAVACHOL) 80 MG tablet TAKE 1 TABLET BY MOUTH EVERY DAY 90 tablet 2  . vitamin E 400 UNIT capsule Take 400 Units by mouth daily.      No results found for this or any previous visit (from the past 48 hour(s)). No results found.  ROS  Blood pressure 134/78, pulse (!) 56, temperature 97.6  F (36.4 C), temperature source Oral, resp. rate 14, height 6\' 2"  (1.88 m), weight 90.7 kg, SpO2 98 %. Physical Exam  Constitutional: He appears well-developed and well-nourished.  HENT:  Mouth/Throat: Oropharynx is clear and moist.  Eyes: Conjunctivae are normal. No scleral icterus.  Neck: No thyromegaly present.  Cardiovascular: Normal rate, regular rhythm and normal heart sounds.  No murmur heard. Respiratory: Effort normal and breath sounds normal.  GI: Soft. He exhibits no distension and no mass. There is no abdominal tenderness.  Musculoskeletal:        General: No edema.  Lymphadenopathy:    He has no cervical adenopathy.  Neurological: He is alert.  Skin: Skin is warm.     Assessment/Plan High risk screening colonoscopy.  Family history of CRC in first-degree relative at age 24.  Hildred Laser, MD 12/11/2018, 7:29 AM

## 2018-12-16 ENCOUNTER — Encounter (HOSPITAL_COMMUNITY): Payer: Self-pay | Admitting: Internal Medicine

## 2019-01-05 DIAGNOSIS — R739 Hyperglycemia, unspecified: Secondary | ICD-10-CM | POA: Diagnosis not present

## 2019-01-05 DIAGNOSIS — E785 Hyperlipidemia, unspecified: Secondary | ICD-10-CM | POA: Diagnosis not present

## 2019-01-05 DIAGNOSIS — I10 Essential (primary) hypertension: Secondary | ICD-10-CM | POA: Diagnosis not present

## 2019-01-05 DIAGNOSIS — I2510 Atherosclerotic heart disease of native coronary artery without angina pectoris: Secondary | ICD-10-CM | POA: Diagnosis not present

## 2019-01-15 DIAGNOSIS — E785 Hyperlipidemia, unspecified: Secondary | ICD-10-CM | POA: Diagnosis not present

## 2019-01-15 DIAGNOSIS — I10 Essential (primary) hypertension: Secondary | ICD-10-CM | POA: Diagnosis not present

## 2019-01-15 DIAGNOSIS — I82402 Acute embolism and thrombosis of unspecified deep veins of left lower extremity: Secondary | ICD-10-CM | POA: Diagnosis not present

## 2019-01-15 DIAGNOSIS — I2510 Atherosclerotic heart disease of native coronary artery without angina pectoris: Secondary | ICD-10-CM | POA: Diagnosis not present

## 2019-01-27 IMAGING — CT CT BIOPSY
4 of 6 series · 14 of 32 positions shown, 19 images · non-contrast
Comparison: none

CLINICAL DATA: Possible sacroiliac joint dysfunction.

[Series 2: needle -guided injection · axial · 0.74mm/px · z∈[-157,-41]mm · 6 of 82 slices shown, 11 images (1 of 4)]
[im 12/82  soft-tissue]
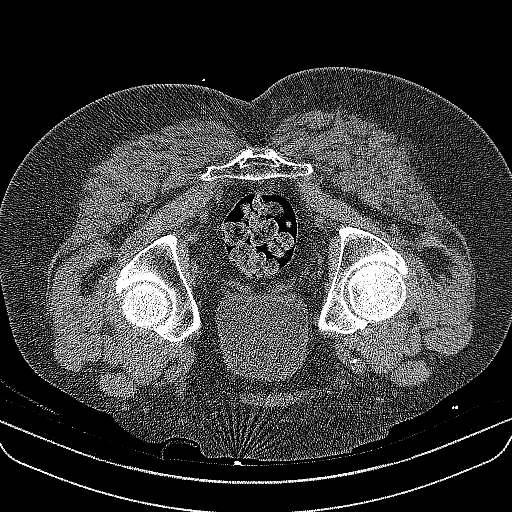
[im 12/82  bone]
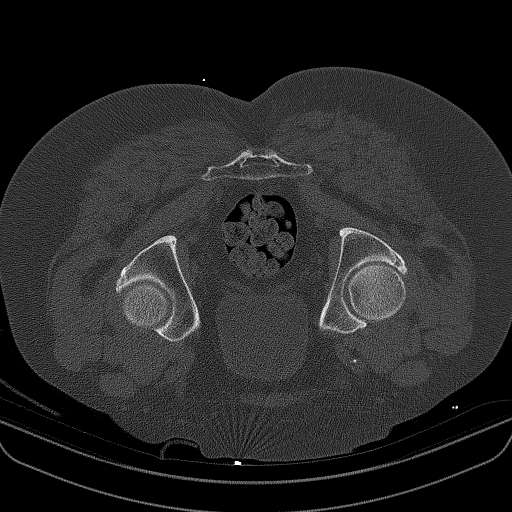
[im 24/82  soft-tissue]
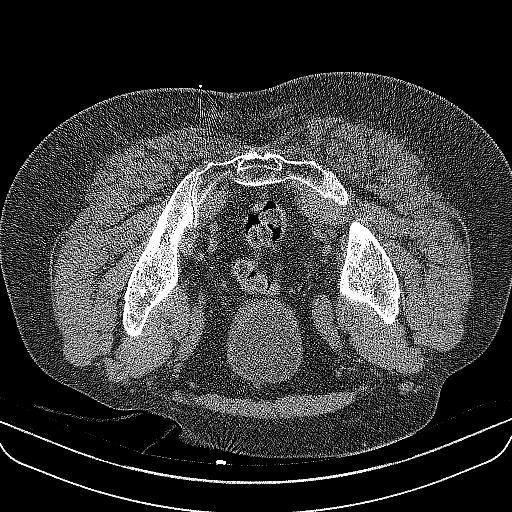
[im 35/82  soft-tissue]
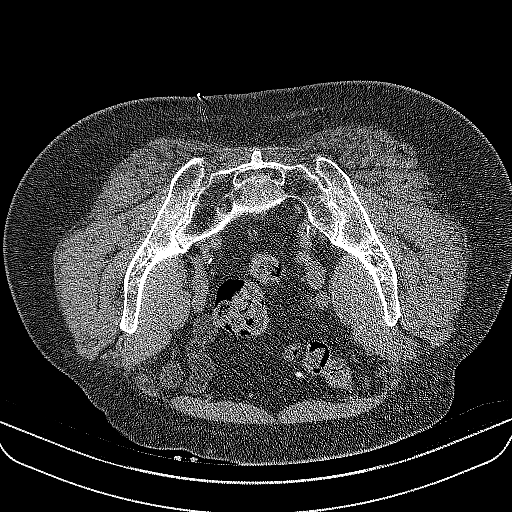
[im 35/82  lung]
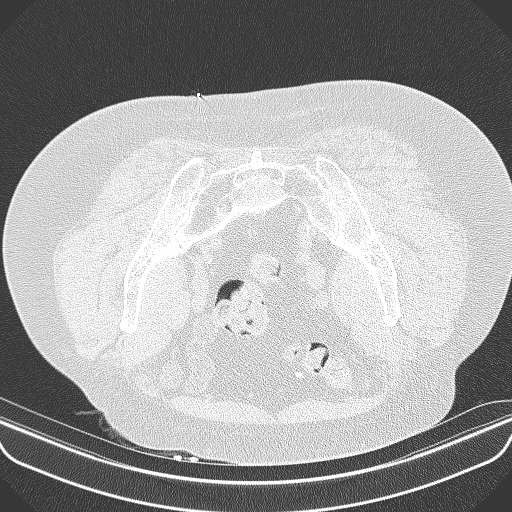
[im 47/82  soft-tissue]
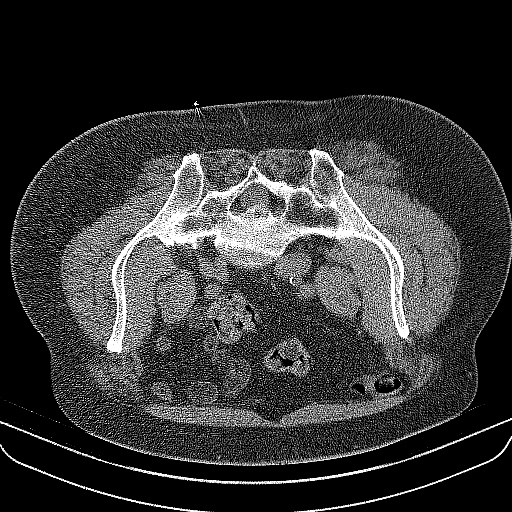
[im 47/82  lung]
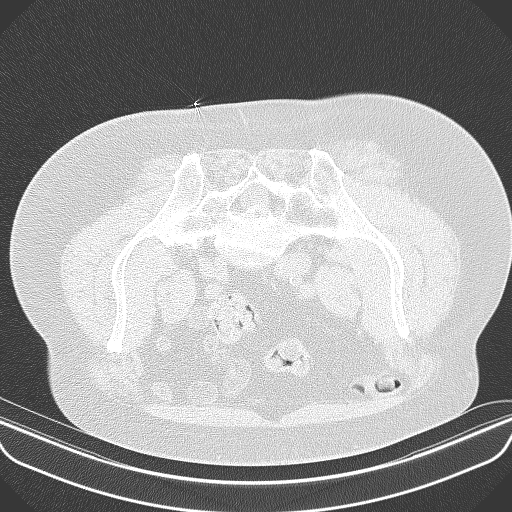
[im 58/82  soft-tissue]
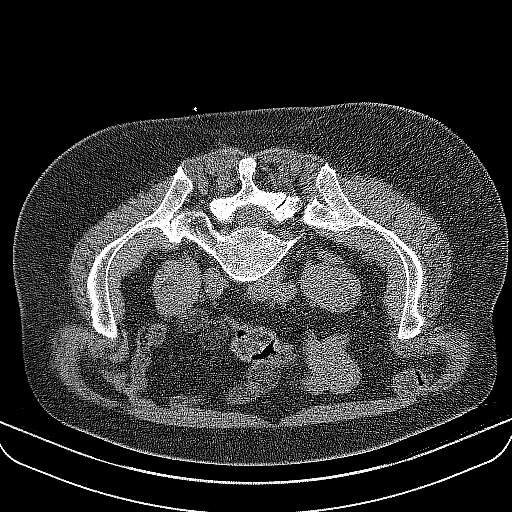
[im 58/82  lung]
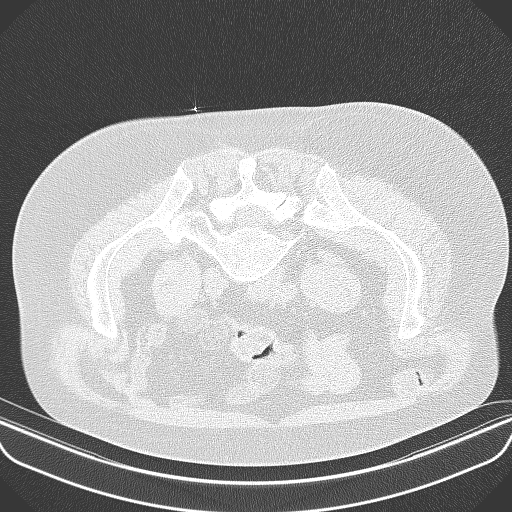
[im 70/82  soft-tissue]
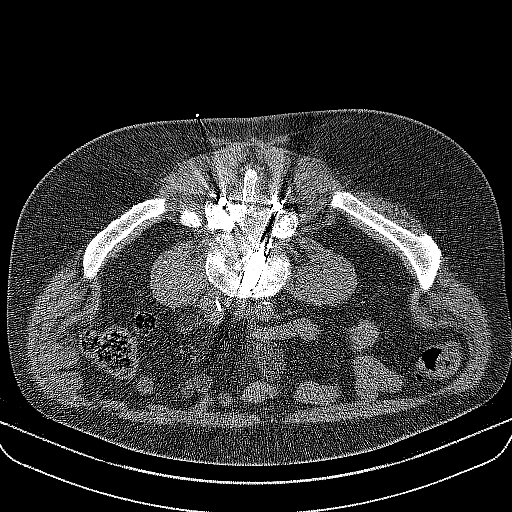
[im 70/82  lung]
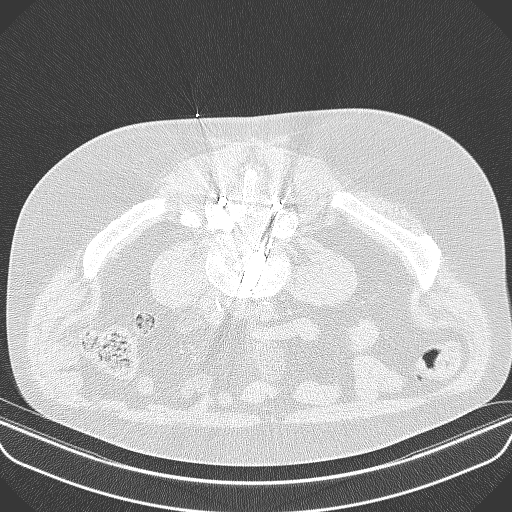

[Series 4: needle -guided injection · axial · 0.74mm/px · z∈[-161,-117]mm · 3 of 44 slices shown (2 of 4)]
[im 11/44  soft-tissue]
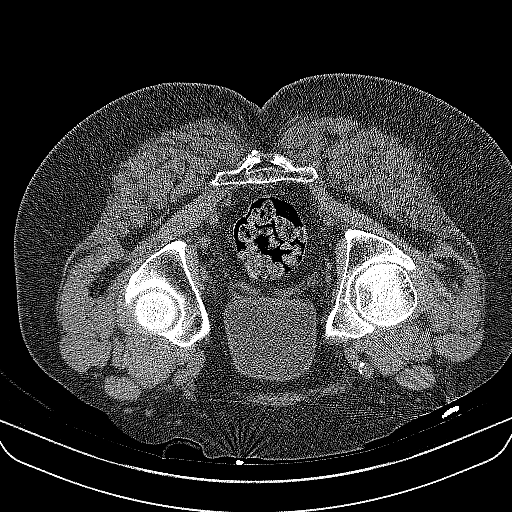
[im 22/44  soft-tissue]
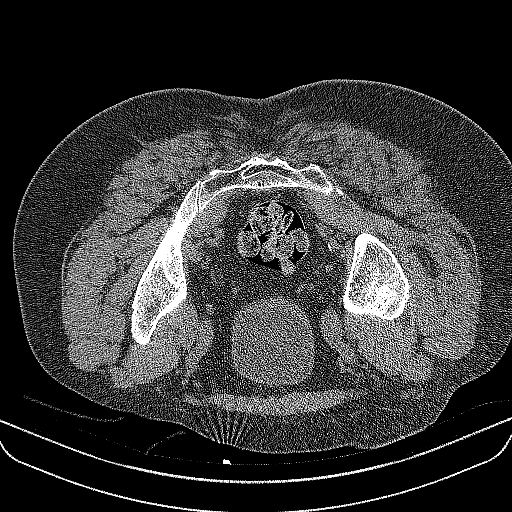
[im 33/44  soft-tissue]
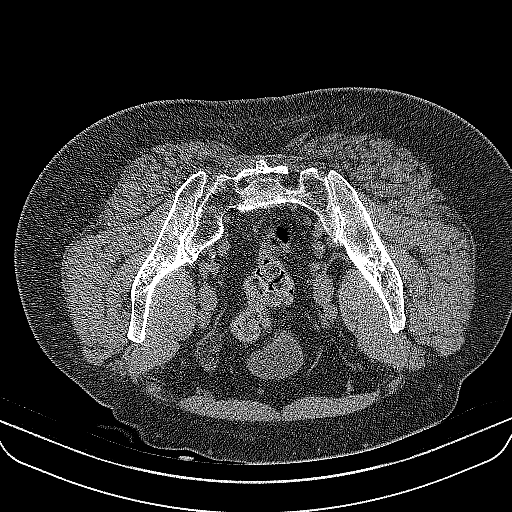

[Series 5: needle -guided injection · axial · 0.74mm/px · z∈[-161,-117]mm · 3 of 44 slices shown (3 of 4)]
[im 11/44  soft-tissue]
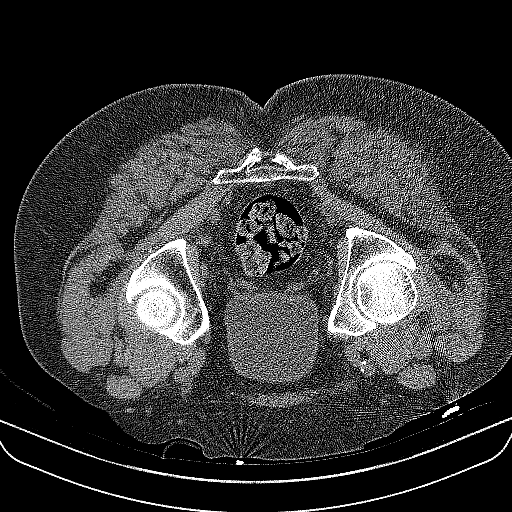
[im 22/44  soft-tissue]
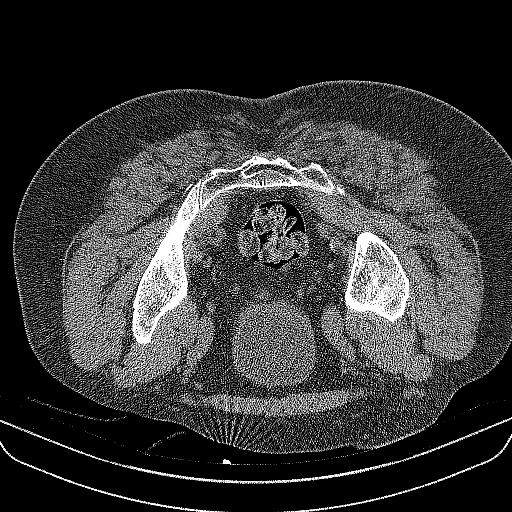
[im 33/44  soft-tissue]
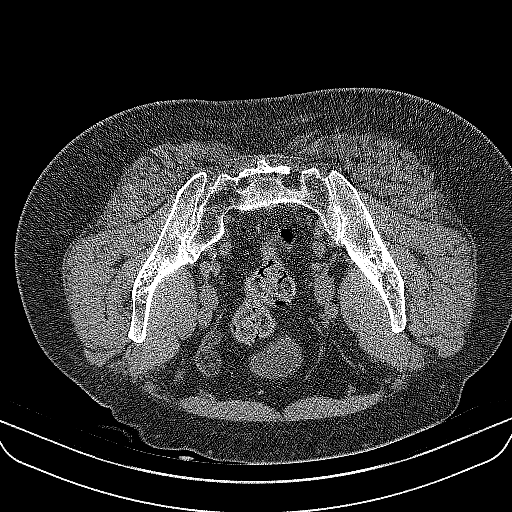

[Series 6: needle -guided injection · axial · 0.74mm/px · z∈[-161,-139]mm · 2 of 44 slices shown (4 of 4)]
[im 11/44  soft-tissue]
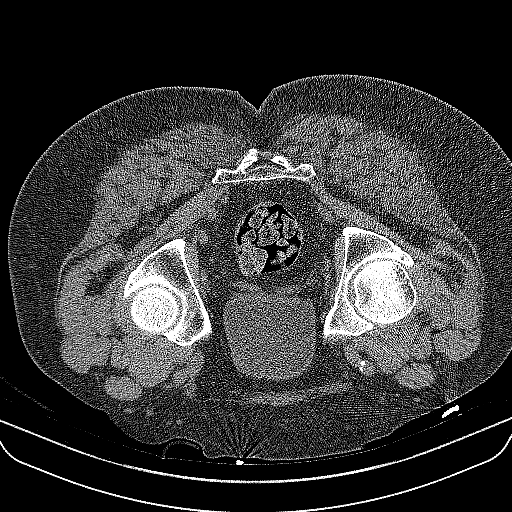
[im 22/44  soft-tissue]
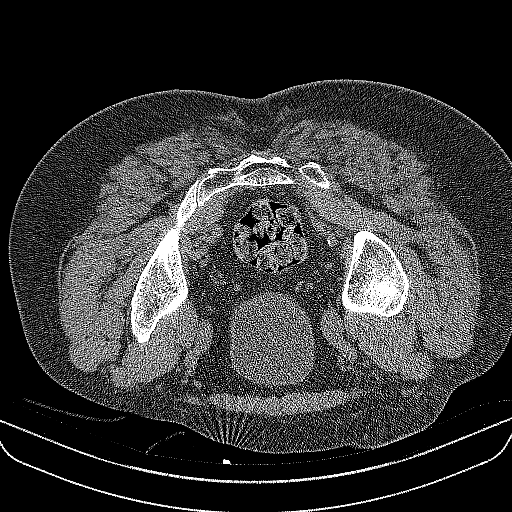

[14 of 32 positions shown; findings below may reference images not displayed]

EXAM:
RIGHT CT GUIDED SI JOINT INJECTION



After local anesthesia with 1% lidocaine without epinephrine and
subsequent deep anesthesia, a 20 gauge spinal needle was advanced
into the RIGHT SI joint under intermittent CT guidance.

Once the needle was in satisfactory position, contrast was injected,
and representative image was captured with the needle and contrast
demonstrated in the sacroiliac joint. Subsequently, 2.5 mL 0.5%
Sensorcaine was injected into the RIGHT SI joint. Needles removed
and a sterile dressing applied.

No complications were observed.
IMPRESSION: Successful CT-guided RIGHT SI joint injection. Postprocedure, the
patient did not feel he was significantly improved. He was
instructed to keep a pain diary.

## 2019-02-01 ENCOUNTER — Other Ambulatory Visit: Payer: Self-pay | Admitting: Cardiovascular Disease

## 2019-02-02 DIAGNOSIS — M25662 Stiffness of left knee, not elsewhere classified: Secondary | ICD-10-CM | POA: Diagnosis not present

## 2019-02-02 DIAGNOSIS — M25562 Pain in left knee: Secondary | ICD-10-CM | POA: Diagnosis not present

## 2019-02-02 DIAGNOSIS — M1712 Unilateral primary osteoarthritis, left knee: Secondary | ICD-10-CM | POA: Diagnosis not present

## 2019-02-24 DIAGNOSIS — Z01818 Encounter for other preprocedural examination: Secondary | ICD-10-CM | POA: Diagnosis not present

## 2019-03-05 ENCOUNTER — Other Ambulatory Visit: Payer: Self-pay

## 2019-03-05 DIAGNOSIS — I2510 Atherosclerotic heart disease of native coronary artery without angina pectoris: Secondary | ICD-10-CM

## 2019-03-05 DIAGNOSIS — I10 Essential (primary) hypertension: Secondary | ICD-10-CM

## 2019-03-05 MED ORDER — METOPROLOL TARTRATE 50 MG PO TABS: 50.0000 mg | ORAL_TABLET | Freq: Two times a day (BID) | ORAL | 3 refills | Status: DC

## 2019-03-17 DIAGNOSIS — M1712 Unilateral primary osteoarthritis, left knee: Secondary | ICD-10-CM | POA: Diagnosis not present

## 2019-03-31 DIAGNOSIS — Z5189 Encounter for other specified aftercare: Secondary | ICD-10-CM | POA: Insufficient documentation

## 2019-03-31 DIAGNOSIS — Z96652 Presence of left artificial knee joint: Secondary | ICD-10-CM | POA: Insufficient documentation

## 2019-04-14 DIAGNOSIS — I2510 Atherosclerotic heart disease of native coronary artery without angina pectoris: Secondary | ICD-10-CM | POA: Diagnosis not present

## 2019-04-14 DIAGNOSIS — I10 Essential (primary) hypertension: Secondary | ICD-10-CM | POA: Diagnosis not present

## 2019-04-14 DIAGNOSIS — I82402 Acute embolism and thrombosis of unspecified deep veins of left lower extremity: Secondary | ICD-10-CM | POA: Diagnosis not present

## 2019-04-14 DIAGNOSIS — E785 Hyperlipidemia, unspecified: Secondary | ICD-10-CM | POA: Diagnosis not present

## 2019-04-14 DIAGNOSIS — R7309 Other abnormal glucose: Secondary | ICD-10-CM | POA: Diagnosis not present

## 2019-04-14 LAB — BASIC METABOLIC PANEL
BUN: 18 (ref 4–21)
CO2: 28 — AB (ref 13–22)
Chloride: 104 (ref 99–108)
Creatinine: 0.9 (ref <=1.3)
Glucose: 106
Potassium: 4.9 (ref 3.4–5.3)
Sodium: 140 (ref 137–147)

## 2019-04-14 LAB — CBC AND DIFFERENTIAL
HCT: 34 — AB (ref 41–53)
Hemoglobin: 10.7 — AB (ref 13.5–17.5)
Neutrophils Absolute: 356
Platelets: 401 — AB (ref 150–399)
WBC: 6.3

## 2019-04-14 LAB — HEPATIC FUNCTION PANEL
ALT: 9 — AB (ref 10–40)
AST: 12 — AB (ref 14–40)
Alkaline Phosphatase: 73 (ref 25–125)

## 2019-04-14 LAB — LIPID PANEL
Cholesterol: 120 (ref 0–200)
HDL: 44 (ref 35–70)
LDL Cholesterol: 62
Triglycerides: 49 (ref 40–160)

## 2019-04-14 LAB — COMPREHENSIVE METABOLIC PANEL
Albumin: 3.9 (ref 3.5–5.0)
Calcium: 9.1 (ref 8.7–10.7)
GFR calc Af Amer: 100
GFR calc non Af Amer: 87
Globulin: 2.5

## 2019-04-14 LAB — PSA: PSA: 1.3

## 2019-04-14 LAB — CBC: RBC: 4.04 (ref 3.87–5.11)

## 2019-04-14 LAB — HEMOGLOBIN A1C: Hemoglobin A1C: 5.4

## 2019-04-14 LAB — TSH: TSH: 2.88 (ref <=5.90)

## 2019-04-16 DIAGNOSIS — I10 Essential (primary) hypertension: Secondary | ICD-10-CM | POA: Diagnosis not present

## 2019-04-16 DIAGNOSIS — Z23 Encounter for immunization: Secondary | ICD-10-CM | POA: Diagnosis not present

## 2019-04-16 DIAGNOSIS — E785 Hyperlipidemia, unspecified: Secondary | ICD-10-CM | POA: Diagnosis not present

## 2019-04-16 DIAGNOSIS — M159 Polyosteoarthritis, unspecified: Secondary | ICD-10-CM | POA: Diagnosis not present

## 2019-04-16 DIAGNOSIS — I2510 Atherosclerotic heart disease of native coronary artery without angina pectoris: Secondary | ICD-10-CM | POA: Diagnosis not present

## 2019-04-17 ENCOUNTER — Other Ambulatory Visit: Payer: Self-pay | Admitting: Pulmonary Disease

## 2019-04-17 DIAGNOSIS — G8929 Other chronic pain: Secondary | ICD-10-CM

## 2019-04-21 DIAGNOSIS — M1711 Unilateral primary osteoarthritis, right knee: Secondary | ICD-10-CM | POA: Diagnosis not present

## 2019-04-21 DIAGNOSIS — Z96652 Presence of left artificial knee joint: Secondary | ICD-10-CM | POA: Diagnosis not present

## 2019-04-21 DIAGNOSIS — Z471 Aftercare following joint replacement surgery: Secondary | ICD-10-CM | POA: Diagnosis not present

## 2019-04-24 DIAGNOSIS — I82402 Acute embolism and thrombosis of unspecified deep veins of left lower extremity: Secondary | ICD-10-CM | POA: Insufficient documentation

## 2019-04-24 DIAGNOSIS — M159 Polyosteoarthritis, unspecified: Secondary | ICD-10-CM | POA: Insufficient documentation

## 2019-05-06 ENCOUNTER — Inpatient Hospital Stay: Admission: RE | Admit: 2019-05-06 | Payer: BC Managed Care – PPO | Source: Ambulatory Visit

## 2019-05-13 ENCOUNTER — Inpatient Hospital Stay: Admission: RE | Admit: 2019-05-13 | Payer: BC Managed Care – PPO | Source: Ambulatory Visit

## 2019-05-20 ENCOUNTER — Encounter: Payer: Self-pay | Admitting: Family Medicine

## 2019-05-20 ENCOUNTER — Other Ambulatory Visit: Payer: Self-pay

## 2019-05-20 ENCOUNTER — Ambulatory Visit: Payer: BC Managed Care – PPO | Admitting: Family Medicine

## 2019-05-20 VITALS — BP 120/70 | HR 56 | Temp 98.0°F | Ht 74.0 in | Wt 195.8 lb

## 2019-05-20 DIAGNOSIS — M5136 Other intervertebral disc degeneration, lumbar region: Secondary | ICD-10-CM

## 2019-05-20 DIAGNOSIS — M533 Sacrococcygeal disorders, not elsewhere classified: Secondary | ICD-10-CM

## 2019-05-20 DIAGNOSIS — E7800 Pure hypercholesterolemia, unspecified: Secondary | ICD-10-CM | POA: Diagnosis not present

## 2019-05-20 DIAGNOSIS — K219 Gastro-esophageal reflux disease without esophagitis: Secondary | ICD-10-CM | POA: Diagnosis not present

## 2019-05-20 DIAGNOSIS — I10 Essential (primary) hypertension: Secondary | ICD-10-CM

## 2019-05-20 DIAGNOSIS — D649 Anemia, unspecified: Secondary | ICD-10-CM

## 2019-05-20 NOTE — Progress Notes (Signed)
New Patient Office Visit  Subjective:  Patient ID: Timothy Shaw, male    DOB: 07-22-1955  Age: 63 y.o. MRN: [id number]  CC:  Chief Complaint  Patient presents with  . Establish Care  . Back Pain    Hx of Spine Surgery   04/21/2019 History of Left Total Knee Replacement; Aftercare; Osteoarthritis of Knee; Osteoarthritis of Right Knee Joint Gearlean Alf, MD: [address] [place], [place], [place] [address], Ph. [phone]  03/31/2019 History of Left Total Knee Replacement; Aftercare Gearlean Alf, MD: [address] [place], [place], [place] [address], Ph. [phone]  02/18/2019 Osteoarthritis of Left Knee Joint; Pre-surgery Testing Loann Quill, PA-C: [address] [place], [place], [place] [address], Ph. [phone]  02/02/2019 Osteoarthritis of Left Knee Joint; Stiffness of Left Knee; Pain in Left Knee Haynes Hoehn, DPT: [address] [place], [place], [place] [address], Ph. [phone]  08/07/2018 Pain in Left Knee; Osteoarthritis of Left Knee Joint; Osteoarthritis of Right Knee Joint Gearlean Alf, MD: [address] [place], [place], [place] [address], Ph. [phone]  Completed knee replacement surgery with physical therapy-left knee most painful  -pt will completed the right knee replacement 2021 Pt out work-industrial mechanic-work for [employer]   HPI Timothy Shaw presents for HTN-amlodipine/metoprolol-sees cardiology yearly-stress test in the past Hyperlipidemia-lipitor-stable 11/20 lipid panel stable, lft stable L5-birth defect per pt-pt states surgery 3/19-fusion to "fix" back-spine still causing problems -difficulty with right leg leading on climbing. Pt getting injections to decrease pain. Pt states insurance refused injections -re-submitted to insurance for injections. Pt states he did not get approval for injections. Pt states back pain 2017-2019 until surgery. Pt states difficulty with  pain-neurology discovered L5 hitting SI joint-ortho vs neurologic problem. Pt states neurosurgery does not recommend further surgery.  Pt states some therapy can not be completed Surgeries: Procedure(s):08/2017 LUMBAR THREE-LUMBAR FIVE DECOMPRESSION AND LEFT SIDED LUMBAR FOUR-FIVE TRANSFORAMINAL LUMBAR INTERBODY FUSION WITH INSTURMENTATION AND ALLOGRAFT. on 08/21/2017  Hospital Course: Timothy Shaw is an 63 y.o. male who was admitted 08/21/2017 for operative treatment of radiculoapthy. Patient has severe unremitting pain that affects sleep, daily activities, and work/hobbies. After pre-op clearance the patient was taken to the operating room on 08/21/2017 and underwent  Procedure(s): LUMBAR THREE-LUMBAR FIVE DECOMPRESSION AND LEFT SIDED LUMBAR FOUR-FIVE TRANSFORAMINAL LUMBAR INTERBODY FUSION WITH INSTURMENTATION AND ALLOGRAFT Past Medical History:  Diagnosis Date  . Acute embolism and thrombosis of unspecified deep veins of left lower extremity ([place])   . Anemia   . Anxiety   . Anxiety disorder   . Arteriosclerotic cardiovascular disease (ASCVD)    with angina: 09/2009-100% proximal RCA; 50% proximal&60% mid LAD; 30-50% mid-Cx, normal EF  . Arthritis    Lyme disease in 10/2009 treated with antibiotics  . Chronic headaches   . Clotting disorder ([place])   . Cutaneous abscess of right lower limb   . Gastroesophageal reflux disease   . Hyperlipidemia    Total cholesterol-136, triglycerides of 89, HDL 48 and LDL of 70 in 03/2010  . Hypertension    Left ventricular hypertrophy  . Polyosteoarthritis, unspecified   . PONV (postoperative nausea and vomiting)   . Tobacco abuse, in remission    20 pack years; mild airway obstruction on PFTs in 2010    Past Surgical History:  Procedure Laterality Date  . APPENDECTOMY  1970  . COLONOSCOPY  2008  . COLONOSCOPY N/A 12/11/2018   Procedure: COLONOSCOPY;  Surgeon: Rogene Houston, MD;  [facility];  Service: Endoscopy;  Laterality: N/A;  730   . HERNIA REPAIR    . inguinal hernia repair    . KNEE ARTHROSCOPY W/ MENISCAL REPAIR  2006, 2012  . NASAL SINUS SURGERY    . POLYPECTOMY  12/11/2018   Procedure: POLYPECTOMY;  Surgeon: Rogene Houston, MD;  [facility];  Service: Endoscopy;;  ascending colon  . SPINE SURGERY    . TONSILLECTOMY  1962  . UMBILICAL HERNIA REPAIR  2003    Family History  Problem Relation Age of Onset  . Heart disease Father   . Hyperlipidemia Father   . Hypertension Father   . Heart disease Mother   . Hyperlipidemia Mother   . Hypertension Mother   . Heart failure Other        conduction system disease requiring pacemaker  . Lupus Other        Sibling  . Colon cancer Other        Sibling  . Colon cancer Sister   . Breast cancer Sister   . Esophageal cancer Neg Hx   . Stomach cancer Neg Hx   . Rectal cancer Neg Hx     Social History   Socioeconomic History  . Marital status: Married    Spouse name: Not on file  . Number of children: 1  . Years of education: Not on file  . Highest education level: Not on file  Occupational History  . Occupation: Air traffic controller  Tobacco Use  . Smoking status: Former Smoker    Packs/day: 1.00    Years: 20.00    Pack years: 20.00    Types: Cigarettes    Start date: 06/29/1971    Quit date: 06/04/1988    Years since quitting: 30.9  . Smokeless tobacco: Never Used  Substance and Sexual Activity  . Alcohol use: Yes    Alcohol/week: 1.0 standard drinks    Types: 1 Standard drinks or equivalent per week  . Drug use: No  . Sexual activity: Yes  Other Topics Concern  . Not on file  Social History Narrative  . Not on file   Social Determinants of Health   Financial Resource Strain:   . Difficulty of Paying Living Expenses: Not on file  Food Insecurity:   . Worried About Charity fundraiser in the Last Year: Not on file  . Ran Out of Food in the Last Year: Not on file  Transportation Needs:   . Lack of Transportation (Medical):  Not on file  . Lack of Transportation (Non-Medical): Not on file  Physical Activity:   . Days of Exercise per Week: Not on file  . Minutes of Exercise per Session: Not on file  Stress:   . Feeling of Stress : Not on file  Social Connections:   . Frequency of Communication with Friends and Family: Not on file  . Frequency of Social Gatherings with Friends and Family: Not on file  . Attends Religious Services: Not on file  . Active Member of Clubs or Organizations: Not on file  . Attends Archivist Meetings: Not on file  . Marital Status: Not on file  Intimate Partner Violence:   . Fear of Current or Ex-Partner: Not on file  . Emotionally Abused: Not on file  . Physically Abused: Not on file  . Sexually Abused: Not on file    ROS Review of Systems  Eyes:       Glasses -utd  Respiratory:  Negative.   Cardiovascular:       CAD HTN  Gastrointestinal: Positive for constipation.  Genitourinary: Negative.   Musculoskeletal:       Left knee replacement  Neurological: Negative.   Hematological:       Low H/H-likely surgery-follow needed  Psychiatric/Behavioral: Negative.    IMPRESSION:2/20-u/s No evidence of acute or chronic DVT within the left lower extremity with special attention paid to the distal aspect of the left femoral as well as the left peroneal veins.   Objective:   Today's Vitals: BP 120/70 (BP Location: Right Arm, Patient Position: Sitting, Cuff Size: Normal)   Pulse (!) 56   Temp 98 F (36.7 C) (Oral)   Ht 6\' 2"  (1.88 m)   Wt 195 lb 12.8 oz (88.8 kg)   SpO2 96%   BMI 25.14 kg/m   Physical Exam Constitutional:      Appearance: Normal appearance. He is normal weight.  HENT:     Head: Normocephalic and atraumatic.  Cardiovascular:     Rate and Rhythm: Normal rate and regular rhythm.     Pulses: Normal pulses.     Heart sounds: Normal heart sounds.  Pulmonary:     Effort: Pulmonary effort is normal.     Breath sounds: Normal breath  sounds.  Musculoskeletal:     Cervical back: Normal range of motion and neck supple.  Neurological:     Mental Status: He is alert and oriented to person, place, and time.  Psychiatric:        Mood and Affect: Mood normal.        Behavior: Behavior normal.     Assessment & Plan:  1. Gastroesophageal reflux disease without esophagitis Nexium -stable-otc-egd  2. Essential hypertension Amlodipine/metoprolol-stable-sees cardiology f/u yearly  3. Hypercholesterolemia provastatin -stable-lft/lipid panel stable 11/20  4. Lumbar degenerative disc disease Refer to ortho spine-previously seen by Dr. Ellsworth Lennox but resistance to additional surgery Pt with injections in the past-insurance refusing continued infections mobic prn 5. Disorder of SI (sacroiliac) joint Ortho surgery referral-pt states surgery  Outpatient Encounter Medications as of 05/20/2019  Medication Sig  . amLODipine (NORVASC) 5 MG tablet TAKE 1 TABLET BY MOUTH EVERY DAY  . aspirin EC 81 MG tablet Take 1 tablet (81 mg total) by mouth daily.  Marland Kitchen esomeprazole (NEXIUM) 20 MG capsule Take 20 mg by mouth daily before breakfast. Over the counter  . Krill Oil 500 MG CAPS Take 500 mg by mouth daily.  . meloxicam (MOBIC) 15 MG tablet Take 15 mg by mouth daily.   . metoprolol tartrate (LOPRESSOR) 50 MG tablet Take 1 tablet (50 mg total) by mouth 2 (two) times daily.  . pravastatin (PRAVACHOL) 80 MG tablet TAKE 1 TABLET BY MOUTH EVERY DAY  . vitamin E 400 UNIT capsule Take 400 Units by mouth daily.  . [DISCONTINUED] gabapentin (NEURONTIN) 300 MG capsule    No facility-administered encounter medications on file as of 05/20/2019.    Follow-up:  CPE at pts convenience  Rayvon Brandvold Hannah Beat, MD

## 2019-06-01 DIAGNOSIS — M4696 Unspecified inflammatory spondylopathy, lumbar region: Secondary | ICD-10-CM | POA: Diagnosis not present

## 2019-06-15 ENCOUNTER — Other Ambulatory Visit: Payer: Self-pay

## 2019-06-15 MED ORDER — PRAVASTATIN SODIUM 80 MG PO TABS: 80.0000 mg | ORAL_TABLET | Freq: Every day | ORAL | 2 refills | Status: DC

## 2019-06-15 NOTE — Telephone Encounter (Signed)
Refilled pravastatin per fax request 

## 2019-08-20 ENCOUNTER — Encounter: Payer: Self-pay | Admitting: Family Medicine

## 2019-08-20 ENCOUNTER — Ambulatory Visit: Payer: BC Managed Care – PPO | Admitting: Family Medicine

## 2019-08-20 ENCOUNTER — Other Ambulatory Visit: Payer: Self-pay

## 2019-08-20 VITALS — BP 140/79 | HR 59 | Temp 98.1°F | Ht 74.0 in | Wt 203.6 lb

## 2019-08-20 DIAGNOSIS — M533 Sacrococcygeal disorders, not elsewhere classified: Secondary | ICD-10-CM

## 2019-08-20 DIAGNOSIS — Z125 Encounter for screening for malignant neoplasm of prostate: Secondary | ICD-10-CM | POA: Diagnosis not present

## 2019-08-20 DIAGNOSIS — E7800 Pure hypercholesterolemia, unspecified: Secondary | ICD-10-CM

## 2019-08-20 DIAGNOSIS — I10 Essential (primary) hypertension: Secondary | ICD-10-CM

## 2019-08-20 DIAGNOSIS — M5136 Other intervertebral disc degeneration, lumbar region: Secondary | ICD-10-CM

## 2019-08-20 DIAGNOSIS — D171 Benign lipomatous neoplasm of skin and subcutaneous tissue of trunk: Secondary | ICD-10-CM | POA: Insufficient documentation

## 2019-08-20 DIAGNOSIS — K219 Gastro-esophageal reflux disease without esophagitis: Secondary | ICD-10-CM | POA: Diagnosis not present

## 2019-08-20 NOTE — Patient Instructions (Signed)
Fasting labwork 10/2019 Ortho referral

## 2019-08-20 NOTE — Progress Notes (Signed)
Established Patient Office Visit  Subjective:  Patient ID: Timothy Shaw, male    DOB: [birth year]  Age: 64 y.o. MRN: [id number]  CC:  Chief Complaint  Patient presents with  . Annual Exam    annual exam today with form to be filled out for work    HPI Timothy Shaw presents for annual exam-work exam-11/20 labwork Past Medical History:  Diagnosis Date  . Acute embolism and thrombosis of unspecified deep veins of left lower extremity ([place])   . Anemia   . Anxiety   . Anxiety disorder   . Arteriosclerotic cardiovascular disease (ASCVD)    with angina: 09/2009-100% proximal RCA; 50% proximal&60% mid LAD; 30-50% mid-Cx, normal EF  . Arthritis    Lyme disease in 10/2009 treated with antibiotics  . Chronic headaches   . Clotting disorder ([place])   . Cutaneous abscess of right lower limb   . Gastroesophageal reflux disease   . Hyperlipidemia    Total cholesterol-136, triglycerides of 89, HDL 48 and LDL of 70 in 03/2010  . Hypertension    Left ventricular hypertrophy  . Polyosteoarthritis, unspecified   . PONV (postoperative nausea and vomiting)   . Tobacco abuse, in remission    20 pack years; mild airway obstruction on PFTs in 2010    Past Surgical History:  Procedure Laterality Date  . APPENDECTOMY  1970  . COLONOSCOPY  2008  . COLONOSCOPY N/A 12/11/2018   Procedure: COLONOSCOPY;  Surgeon: Rogene Houston, MD;  [facility];  Service: Endoscopy;  Laterality: N/A;  730  . HERNIA REPAIR    . inguinal hernia repair    . KNEE ARTHROSCOPY W/ MENISCAL REPAIR  2006, 2012  . NASAL SINUS SURGERY    . POLYPECTOMY  12/11/2018   Procedure: POLYPECTOMY;  Surgeon: Rogene Houston, MD;  [facility];  Service: Endoscopy;;  ascending colon  . SPINE SURGERY    . TONSILLECTOMY  1962  . UMBILICAL HERNIA REPAIR  2003    Family History  Problem Relation Age of Onset  . Heart disease Father   . Hyperlipidemia Father   . Hypertension Father   . Heart disease Mother    . Hyperlipidemia Mother   . Hypertension Mother   . Heart failure Other        conduction system disease requiring pacemaker  . Lupus Other        Sibling  . Colon cancer Other        Sibling  . Colon cancer Sister   . Breast cancer Sister   . Esophageal cancer Neg Hx   . Stomach cancer Neg Hx   . Rectal cancer Neg Hx     Social History   Socioeconomic History  . Marital status: Married    Spouse name: Not on file  . Number of children: 1  . Years of education: Not on file  . Highest education level: Not on file  Occupational History  . Occupation: Air traffic controller  Tobacco Use  . Smoking status: Former Smoker    Packs/day: 1.00    Years: 20.00    Pack years: 20.00    Types: Cigarettes    Start date: 06/29/1971    Quit date: 06/04/1988    Years since quitting: 31.2  . Smokeless tobacco: Never Used  Substance and Sexual Activity  . Alcohol use: Yes    Alcohol/week: 1.0 standard drinks    Types: 1 Standard drinks or equivalent per week  . Drug  use: No  . Sexual activity: Yes  Other Topics Concern  . Not on file  Social History Narrative  . Not on file   Social Determinants of Health   Financial Resource Strain:   . Difficulty of Paying Living Expenses:   Food Insecurity:   . Worried About Charity fundraiser in the Last Year:   . Arboriculturist in the Last Year:   Transportation Needs:   . Film/video editor (Medical):   Marland Kitchen Lack of Transportation (Non-Medical):   Physical Activity:   . Days of Exercise per Week:   . Minutes of Exercise per Session:   Stress:   . Feeling of Stress :   Social Connections:   . Frequency of Communication with Friends and Family:   . Frequency of Social Gatherings with Friends and Family:   . Attends Religious Services:   . Active Member of Clubs or Organizations:   . Attends Archivist Meetings:   Marland Kitchen Marital Status:   Intimate Partner Violence:   . Fear of Current or Ex-Partner:   . Emotionally Abused:    Marland Kitchen Physically Abused:   . Sexually Abused:     Outpatient Medications Prior to Visit  Medication Sig Dispense Refill  . amLODipine (NORVASC) 5 MG tablet TAKE 1 TABLET BY MOUTH EVERY DAY 90 tablet 3  . aspirin EC 81 MG tablet Take 1 tablet (81 mg total) by mouth daily.    Marland Kitchen esomeprazole (NEXIUM) 20 MG capsule Take 20 mg by mouth daily before breakfast. Over the counter    . meloxicam (MOBIC) 15 MG tablet Take 15 mg by mouth daily.     . metoprolol tartrate (LOPRESSOR) 50 MG tablet Take 1 tablet (50 mg total) by mouth 2 (two) times daily. 180 tablet 3  . pravastatin (PRAVACHOL) 80 MG tablet Take 1 tablet (80 mg total) by mouth daily. 90 tablet 2  . vitamin E 400 UNIT capsule Take 400 Units by mouth daily.    Javier Docker Oil 500 MG CAPS Take 500 mg by mouth daily.     No facility-administered medications prior to visit.    No Known Allergies  ROS Review of Systems  Constitutional: Negative.   HENT: Negative.   Eyes:       Glasses  Respiratory: Negative.   Cardiovascular: Negative.   Gastrointestinal:       GERD   Endocrine: Negative.   Genitourinary: Negative.   Musculoskeletal: Positive for arthralgias and back pain.       2008-fall from ladder to floor-pt with injections in the past from [facility]-SI joint   Skin:       lipoma  Allergic/Immunologic: Negative.   Neurological: Negative.   Hematological: Negative.   Psychiatric/Behavioral: Negative.       Objective:    Physical Exam  Constitutional: He is oriented to person, place, and time. He appears well-developed and well-nourished.  HENT:  Head: Normocephalic and atraumatic.  Right Ear: External ear normal.  Left Ear: External ear normal.  Nose: Nose normal.  Mouth/Throat: Oropharynx is clear and moist.  Eyes: Conjunctivae are normal.  Cardiovascular: Normal rate, regular rhythm, normal heart sounds and intact distal pulses.  Pulmonary/Chest: Effort normal and breath sounds normal.  Abdominal: Soft. Bowel  sounds are normal.  Musculoskeletal:        General: Normal range of motion.     Cervical back: Normal range of motion and neck supple.  Neurological: He is alert and oriented to  person, place, and time. He has normal reflexes.  Skin: Skin is warm.     Left chest wall mass 7cm  Psychiatric: He has a normal mood and affect. His behavior is normal.    BP 140/79 (BP Location: Left Arm, Patient Position: Sitting, Cuff Size: Normal)   Pulse (!) 59   Temp 98.1 F (36.7 C) (Temporal)   Ht 6\' 2"  (1.88 m)   Wt 203 lb 9.6 oz (92.4 kg)   SpO2 97%   BMI 26.14 kg/m  Wt Readings from Last 3 Encounters:  08/20/19 203 lb 9.6 oz (92.4 kg)  05/20/19 195 lb 12.8 oz (88.8 kg)  12/11/18 200 lb (90.7 kg)     Health Maintenance Due  Topic Date Due  . TETANUS/TDAP  Never done    Lab Results  Component Value Date   TSH 2.88 04/14/2019   Lab Results  Component Value Date   WBC 6.3 04/14/2019   HGB 10.7 (A) 04/14/2019   HCT 34 (A) 04/14/2019   MCV 83.1 08/15/2017   PLT 401 (A) 04/14/2019   Lab Results  Component Value Date   NA 140 04/14/2019   K 4.9 04/14/2019   CO2 28 (A) 04/14/2019   GLUCOSE 96 08/15/2017   BUN 18 04/14/2019   CREATININE 0.9 04/14/2019   BILITOT 0.4 08/15/2017   ALKPHOS 73 04/14/2019   AST 12 (A) 04/14/2019   ALT 9 (A) 04/14/2019   PROT 7.0 08/15/2017   ALBUMIN 3.9 04/14/2019   CALCIUM 9.1 04/14/2019   ANIONGAP 10 08/15/2017   Lab Results  Component Value Date   CHOL 120 04/14/2019   Lab Results  Component Value Date   HDL 44 04/14/2019   Lab Results  Component Value Date   LDLCALC 62 04/14/2019   Lab Results  Component Value Date   TRIG 49 04/14/2019   Lab Results  Component Value Date   CHOLHDL 3.0 02/26/2014   Lab Results  Component Value Date   HGBA1C 5.4 04/14/2019      Assessment & Plan:  1. Essential hypertension Amlodipine/metoprolol-stable - COMPLETE METABOLIC PANEL WITH GFR - Urinalysis 2. Gastroesophageal reflux disease  without esophagitis - CBC nexium-stable 3. Hypercholesterolemia 11/20 -stable - Lipid panel pravachol daily 4. Prostate cancer screening - PSA 5. Lumbar degenerative disc disease Injections in the past-request referral 6. Disorder of SI (sacroiliac) joint Injections in the past-request referral 7. Lipoma of torso Soft lesion noted for 5 years-no enlargement-pt does not want referral Follow-up: ortho-referral for SI/disc concerns Blood work -fasting in May  Dawsen Krieger Hannah Beat, MD

## 2019-08-27 DIAGNOSIS — Z23 Encounter for immunization: Secondary | ICD-10-CM | POA: Diagnosis not present

## 2019-09-14 ENCOUNTER — Other Ambulatory Visit: Payer: Self-pay

## 2019-09-14 ENCOUNTER — Encounter: Payer: Self-pay | Admitting: Cardiovascular Disease

## 2019-09-14 ENCOUNTER — Ambulatory Visit: Payer: BC Managed Care – PPO | Admitting: Cardiovascular Disease

## 2019-09-14 VITALS — BP 128/70 | HR 66 | Ht 74.0 in | Wt 201.0 lb

## 2019-09-14 DIAGNOSIS — I25118 Atherosclerotic heart disease of native coronary artery with other forms of angina pectoris: Secondary | ICD-10-CM

## 2019-09-14 DIAGNOSIS — I10 Essential (primary) hypertension: Secondary | ICD-10-CM | POA: Diagnosis not present

## 2019-09-14 DIAGNOSIS — H8110 Benign paroxysmal vertigo, unspecified ear: Secondary | ICD-10-CM

## 2019-09-14 DIAGNOSIS — E785 Hyperlipidemia, unspecified: Secondary | ICD-10-CM

## 2019-09-14 NOTE — Patient Instructions (Signed)
Medication Instructions:  Your physician recommends that you continue on your current medications as directed. Please refer to the Current Medication list given to you today.  *If you need a refill on your cardiac medications before your next appointment, please call your pharmacy*   Lab Work: None today If you have labs (blood work) drawn today and your tests are completely normal, you will receive your results only by: . MyChart Message (if you have MyChart) OR . A paper copy in the mail If you have any lab test that is abnormal or we need to change your treatment, we will call you to review the results.   Testing/Procedures: None today   Follow-Up: At [facility], you and your health needs are our priority.  As part of our continuing mission to provide you with exceptional heart care, we have created designated Provider Care Teams.  These Care Teams include your primary Cardiologist (physician) and Advanced Practice Providers (APPs -  Physician Assistants and Nurse Practitioners) who all work together to provide you with the care you need, when you need it.  We recommend signing up for the patient portal called "MyChart".  Sign up information is provided on this After Visit Summary.  MyChart is used to connect with patients for Virtual Visits (Telemedicine).  Patients are able to view lab/test results, encounter notes, upcoming appointments, etc.  Non-urgent messages can be sent to your provider as well.   To learn more about what you can do with MyChart, go to [email or web address].    Your next appointment:   12 month(s)  The format for your next appointment:   In Person  Provider:   Suresh Koneswaran, MD   Other Instructions None      Thank you for choosing [facility] !         

## 2019-09-14 NOTE — Progress Notes (Signed)
SUBJECTIVE: Timothy Shaw presents for follow-up of coronary disease.  He also has a history of hypertension, hyperlipidemia, and recurrent DVT.  He has been doing well overall and denies exertional chest pain.  He has not been getting much physical activity since left knee replacement surgery and has some mild shortness of breath after walking a considerable distance.  He denies palpitations.  I personally reviewed ECG performed today which demonstrates sinus rhythm with an isolated PVC.  He did mention that on occasion if he looks up and leans his head back at work he becomes dizzy.  He denies syncope.  Review of Systems: As per "subjective", otherwise negative.  No Known Allergies  Current Outpatient Medications  Medication Sig Dispense Refill  . amLODipine (NORVASC) 5 MG tablet TAKE 1 TABLET BY MOUTH EVERY DAY 90 tablet 3  . aspirin EC 81 MG tablet Take 1 tablet (81 mg total) by mouth daily.    Marland Kitchen esomeprazole (NEXIUM) 20 MG capsule Take 20 mg by mouth daily before breakfast. Over the counter    . meloxicam (MOBIC) 15 MG tablet Take 15 mg by mouth daily.     . metoprolol tartrate (LOPRESSOR) 50 MG tablet Take 1 tablet (50 mg total) by mouth 2 (two) times daily. 180 tablet 3  . Multiple Vitamins-Iron (MULTIVITAMIN PLUS IRON ADULT) TABS Take by mouth.    . pravastatin (PRAVACHOL) 80 MG tablet Take 1 tablet (80 mg total) by mouth daily. 90 tablet 2  . vitamin E 400 UNIT capsule Take 400 Units by mouth daily.     No current facility-administered medications for this visit.    Past Medical History:  Diagnosis Date  . Acute embolism and thrombosis of unspecified deep veins of left lower extremity ([place])   . Anemia   . Anxiety   . Anxiety disorder   . Arteriosclerotic cardiovascular disease (ASCVD)    with angina: 09/2009-100% proximal RCA; 50% proximal&60% mid LAD; 30-50% mid-Cx, normal EF  . Arthritis    Lyme disease in 10/2009 treated with antibiotics  . Chronic headaches   .  Clotting disorder ([place])   . Cutaneous abscess of right lower limb   . Gastroesophageal reflux disease   . Hyperlipidemia    Total cholesterol-136, triglycerides of 89, HDL 48 and LDL of 70 in 03/2010  . Hypertension    Left ventricular hypertrophy  . Polyosteoarthritis, unspecified   . PONV (postoperative nausea and vomiting)   . Tobacco abuse, in remission    20 pack years; mild airway obstruction on PFTs in 2010    Past Surgical History:  Procedure Laterality Date  . APPENDECTOMY  1970  . COLONOSCOPY  2008  . COLONOSCOPY N/A 12/11/2018   Procedure: COLONOSCOPY;  Surgeon: Rogene Houston, MD;  [facility];  Service: Endoscopy;  Laterality: N/A;  730  . HERNIA REPAIR    . inguinal hernia repair    . KNEE ARTHROSCOPY W/ MENISCAL REPAIR  2006, 2012  . NASAL SINUS SURGERY    . POLYPECTOMY  12/11/2018   Procedure: POLYPECTOMY;  Surgeon: Rogene Houston, MD;  [facility];  Service: Endoscopy;;  ascending colon  . REPLACEMENT TOTAL KNEE     Mar 17 2019   . SPINE SURGERY    . TONSILLECTOMY  1962  . UMBILICAL HERNIA REPAIR  2003    Social History   Socioeconomic History  . Marital status: Married    Spouse name: Not on file  . Number  of children: 1  . Years of education: Not on file  . Highest education level: Not on file  Occupational History  . Occupation: Air traffic controller  Tobacco Use  . Smoking status: Former Smoker    Packs/day: 1.00    Years: 20.00    Pack years: 20.00    Types: Cigarettes    Start date: 06/29/1971    Quit date: 06/04/1988    Years since quitting: 31.2  . Smokeless tobacco: Never Used  Substance and Sexual Activity  . Alcohol use: Yes    Alcohol/week: 1.0 standard drinks    Types: 1 Standard drinks or equivalent per week  . Drug use: No  . Sexual activity: Yes  Other Topics Concern  . Not on file  Social History Narrative  . Not on file   Social Determinants of Health   Financial Resource Strain:   . Difficulty  of Paying Living Expenses:   Food Insecurity:   . Worried About Charity fundraiser in the Last Year:   . Arboriculturist in the Last Year:   Transportation Needs:   . Film/video editor (Medical):   Marland Kitchen Lack of Transportation (Non-Medical):   Physical Activity:   . Days of Exercise per Week:   . Minutes of Exercise per Session:   Stress:   . Feeling of Stress :   Social Connections:   . Frequency of Communication with Friends and Family:   . Frequency of Social Gatherings with Friends and Family:   . Attends Religious Services:   . Active Member of Clubs or Organizations:   . Attends Archivist Meetings:   Marland Kitchen Marital Status:   Intimate Partner Violence:   . Fear of Current or Ex-Partner:   . Emotionally Abused:   Marland Kitchen Physically Abused:   . Sexually Abused:     Vitals:   09/14/19 1453  BP: 128/70  Pulse: 66  SpO2: 97%  Weight: 201 lb (91.2 kg)  Height: 6\' 2"  (1.88 m)    Wt Readings from Last 3 Encounters:  09/14/19 201 lb (91.2 kg)  08/20/19 203 lb 9.6 oz (92.4 kg)  05/20/19 195 lb 12.8 oz (88.8 kg)     PHYSICAL EXAM General: NAD HEENT: Normal. Neck: No JVD, no thyromegaly. Lungs: Clear to auscultation bilaterally with normal respiratory effort. CV: Regular rate and rhythm, normal S1/S2, no S3/S4, no murmur. No pretibial or periankle edema.  No carotid bruit.   Abdomen: Soft, nontender, no distention.  Neurologic: Alert and oriented.  Psych: Normal affect. Skin: Normal. Musculoskeletal: No gross deformities.      Labs: Lab Results  Component Value Date/Time   K 4.9 04/14/2019 12:00 AM   BUN 18 04/14/2019 12:00 AM   CREATININE 0.9 04/14/2019 12:00 AM   CREATININE 0.95 08/15/2017 03:25 PM   CREATININE 0.99 12/16/2012 08:20 AM   ALT 9 (A) 04/14/2019 12:00 AM   TSH 2.88 04/14/2019 12:00 AM   TSH 1.470 12/16/2012 08:20 AM   HGB 10.7 (A) 04/14/2019 12:00 AM     Lipids: Lab Results  Component Value Date/Time   LDLCALC 62 04/14/2019 12:00 AM    LDLCALC 82 01/17/2010 12:00 AM   CHOL 120 04/14/2019 12:00 AM   TRIG 49 04/14/2019 12:00 AM   TRIG 70 01/17/2010 12:00 AM   HDL 44 04/14/2019 12:00 AM       ASSESSMENT AND PLAN:  1.  Coronary artery disease: Symptomatically stable.  Continue aspirin, beta-blocker, and statin.  2.  Hypertension:  Blood pressure is controlled on present therapy.  No changes.  3.  Hyperlipidemia: Lipids reviewed above and at goal.  Continue pravastatin.  4.  Dizziness: Symptoms appear to be consistent with benign positional vertigo.  If symptoms become frequent in occurrence, I encouraged him to speak with his PCP about starting meclizine.   Disposition: Follow up 1 yr   Kate Sable, M.D., F.A.C.C.

## 2019-09-17 ENCOUNTER — Ambulatory Visit (INDEPENDENT_AMBULATORY_CARE_PROVIDER_SITE_OTHER): Payer: BC Managed Care – PPO | Admitting: Orthopaedic Surgery

## 2019-09-17 ENCOUNTER — Encounter: Payer: Self-pay | Admitting: Orthopaedic Surgery

## 2019-09-17 ENCOUNTER — Other Ambulatory Visit: Payer: Self-pay

## 2019-09-17 DIAGNOSIS — Z981 Arthrodesis status: Secondary | ICD-10-CM

## 2019-09-19 DIAGNOSIS — Z23 Encounter for immunization: Secondary | ICD-10-CM | POA: Diagnosis not present

## 2019-09-28 DIAGNOSIS — Z981 Arthrodesis status: Secondary | ICD-10-CM | POA: Insufficient documentation

## 2019-09-28 NOTE — Progress Notes (Signed)
Office Visit Note   Patient: Timothy Shaw           Date of Birth: [birth year]           MRN: [id number] Visit Date: 09/17/2019              Requested by: Maryruth Hancock, MD [address] [place],  [place] [id number] PCP: Maryruth Hancock, MD   Assessment & Plan: Visit Diagnoses:  1. History of lumbar fusion     Plan: Patient has solid fusion at L4-5.  He has some mild adjacent level changes at L3-4 with trace retrolisthesis but no impingement.  His fusion looks good and he has been active and working.  We discussed core strengthening program, potential for progression at adjacent levels which might require more surgery at some point in future.  He has gotten improvements when he is had cortisone injections in the past as would be expected.  On CT scan of the pelvis his sacroiliac joints look good.  No compression at the L5-S1 level.  I discussed with patient overall he is doing quite well he had a single level fusion and is back at work he still has some symptoms but has been taking meloxicam.  At this point I do not think additional fusion surgery is indicated.  We spent considerable time and reviewed multiple images that he had with injections well as postoperative studies.  He has had a good surgery by Dr. Lynann Bologna and I will try to be as active as possible he can.  We discussed avoiding activities such as repetitive turning twisting bending.  We reviewed some the mild facet changes which is not unusual for his age.  Follow-Up Instructions: No follow-ups on file.   Orders:  No orders of the defined types were placed in this encounter.  No orders of the defined types were placed in this encounter.     Procedures: No procedures performed   Clinical Data: No additional findings.   Subjective: Chief Complaint  Patient presents with  . Lower Back - Pain  . Right Hip - Pain    HPI 64 year old male here for second surgical opinion.  Patient had previous surgery by Dr. Lynann Bologna  with a lumbar fusion in March 2019.  Patient states this helped his left side but now has been having more pain in his back radiates into the right hip.  Occasionally has had SI joint injections and states he has gotten them every 4 to 5 months or so and get some relief.  He states recently the insurance has backed off approving them.  He works on concrete is on his feet a lot.  Has been using meloxicam 15 mg daily.  Pain in his back and sometimes radiates down to his knee.  Denies numbness or tingling down his foot.  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  Previous surgeries include total knee arthroplasty in 2020.  Knee scope on the right knee x2 in the distant past.  Appendectomy sinus surgery.  Included with the patient is about 50 pages of notes and 50 pages of diagnostic imaging for epidural injections, therapy etc.  Patient's had previous CT guided sacroiliac injections.  Review of Systems for problems or acid reflux anxiety arthritis history of DVT heart disease hypertension migraines teeth and gum disease.  Lumbar fusion L4-5 2019.  History of DVT Doppler test February 2020 -.   Objective: Vital Signs: BP (!) 160/86  Pulse (!) 59   Ht 6\' 2"  (1.88 m)   Wt 200 lb (90.7 kg)   BMI 25.68 kg/m   Physical Exam Constitutional:      Appearance: He is well-developed.  HENT:     Head: Normocephalic and atraumatic.  Eyes:     Pupils: Pupils are equal, round, and reactive to light.  Neck:     Thyroid: No thyromegaly.     Trachea: No tracheal deviation.  Cardiovascular:     Rate and Rhythm: Normal rate.  Pulmonary:     Effort: Pulmonary effort is normal.     Breath sounds: No wheezing.  Abdominal:     General: Bowel sounds are normal.     Palpations: Abdomen is soft.  Skin:    General: Skin is warm and dry.     Capillary Refill: Capillary refill takes less than 2 seconds.  Neurological:     Mental Status: He is alert and oriented to person, place, and time.    Psychiatric:        Behavior: Behavior normal.        Thought Content: Thought content normal.        Judgment: Judgment normal.     Ortho Exam patient has negative logroll to the hips negative FABER test.  Negative straight leg raising 90 degrees.  Lumbar incisions well-healed.  Mild tenderness over the right SI joint with palpation.  Knee and ankle jerk are intact he is able to heel and toe walk.  Specialty Comments:  No specialty comments available.  Imaging: No results found.   PMFS History: Patient Active Problem List   Diagnosis Date Noted  . History of lumbar fusion 09/28/2019  . Prostate cancer screening 08/20/2019  . Lipoma of torso 08/20/2019  . Polyosteoarthritis, unspecified   . Acute embolism and thrombosis of unspecified deep veins of left lower extremity ([place])   . Aftercare 03/31/2019  . History of total left knee replacement 03/31/2019  . Hx of colonic polyps 08/11/2018  . Family hx of colon cancer 08/11/2018  . Pain in left knee 08/07/2018  . Radiculopathy 08/21/2017  . Disorder of SI (sacroiliac) joint 04/05/2016  . Lumbar degenerative disc disease 04/05/2016  . Hypercholesterolemia 12/18/2012  . Arteriosclerotic cardiovascular disease (ASCVD)   . Hyperlipidemia   . Hypertension 09/22/2010  . ANXIETY 11/10/2009  . Gastroesophageal reflux disease 12/03/2008   Past Medical History:  Diagnosis Date  . Acute embolism and thrombosis of unspecified deep veins of left lower extremity ([place])   . Anemia   . Anxiety   . Anxiety disorder   . Arteriosclerotic cardiovascular disease (ASCVD)    with angina: 09/2009-100% proximal RCA; 50% proximal&60% mid LAD; 30-50% mid-Cx, normal EF  . Arthritis    Lyme disease in 10/2009 treated with antibiotics  . Chronic headaches   . Clotting disorder ([place])   . Cutaneous abscess of right lower limb   . Gastroesophageal reflux disease   . Hyperlipidemia    Total cholesterol-136, triglycerides of 89, HDL 48 and LDL of 70 in  03/2010  . Hypertension    Left ventricular hypertrophy  . Polyosteoarthritis, unspecified   . PONV (postoperative nausea and vomiting)   . Tobacco abuse, in remission    20 pack years; mild airway obstruction on PFTs in 2010    Family History  Problem Relation Age of Onset  . Heart disease Father   . Hyperlipidemia Father   . Hypertension Father   . Heart disease Mother   .  Hyperlipidemia Mother   . Hypertension Mother   . Heart failure Other        conduction system disease requiring pacemaker  . Lupus Other        Sibling  . Colon cancer Other        Sibling  . Colon cancer Sister   . Breast cancer Sister   . Esophageal cancer Neg Hx   . Stomach cancer Neg Hx   . Rectal cancer Neg Hx     Past Surgical History:  Procedure Laterality Date  . APPENDECTOMY  1970  . COLONOSCOPY  2008  . COLONOSCOPY N/A 12/11/2018   Procedure: COLONOSCOPY;  Surgeon: Rogene Houston, MD;  [facility];  Service: Endoscopy;  Laterality: N/A;  730  . HERNIA REPAIR    . inguinal hernia repair    . KNEE ARTHROSCOPY W/ MENISCAL REPAIR  2006, 2012  . NASAL SINUS SURGERY    . POLYPECTOMY  12/11/2018   Procedure: POLYPECTOMY;  Surgeon: Rogene Houston, MD;  [facility];  Service: Endoscopy;;  ascending colon  . REPLACEMENT TOTAL KNEE     Mar 17 2019   . SPINE SURGERY    . TONSILLECTOMY  1962  . UMBILICAL HERNIA REPAIR  2003   Social History   Occupational History  . Occupation: Air traffic controller  Tobacco Use  . Smoking status: Former Smoker    Packs/day: 1.00    Years: 20.00    Pack years: 20.00    Types: Cigarettes    Start date: 06/29/1971    Quit date: 06/04/1988    Years since quitting: 31.3  . Smokeless tobacco: Never Used  Substance and Sexual Activity  . Alcohol use: Yes    Alcohol/week: 1.0 standard drinks    Types: 1 Standard drinks or equivalent per week  . Drug use: No  . Sexual activity: Yes

## 2019-10-14 ENCOUNTER — Encounter: Payer: BC Managed Care – PPO | Admitting: Family Medicine

## 2019-10-26 ENCOUNTER — Other Ambulatory Visit: Payer: Self-pay | Admitting: Cardiovascular Disease

## 2019-11-11 ENCOUNTER — Encounter: Payer: BC Managed Care – PPO | Admitting: Family Medicine

## 2019-11-11 DIAGNOSIS — Z0189 Encounter for other specified special examinations: Secondary | ICD-10-CM | POA: Diagnosis not present

## 2019-11-11 DIAGNOSIS — K219 Gastro-esophageal reflux disease without esophagitis: Secondary | ICD-10-CM | POA: Diagnosis not present

## 2019-11-11 DIAGNOSIS — I10 Essential (primary) hypertension: Secondary | ICD-10-CM | POA: Diagnosis not present

## 2019-11-11 DIAGNOSIS — E785 Hyperlipidemia, unspecified: Secondary | ICD-10-CM | POA: Diagnosis not present

## 2019-11-25 DIAGNOSIS — I10 Essential (primary) hypertension: Secondary | ICD-10-CM | POA: Diagnosis not present

## 2019-11-25 DIAGNOSIS — E785 Hyperlipidemia, unspecified: Secondary | ICD-10-CM | POA: Diagnosis not present

## 2019-11-25 DIAGNOSIS — Z0000 Encounter for general adult medical examination without abnormal findings: Secondary | ICD-10-CM | POA: Diagnosis not present

## 2019-11-25 DIAGNOSIS — Z125 Encounter for screening for malignant neoplasm of prostate: Secondary | ICD-10-CM | POA: Diagnosis not present

## 2019-12-01 DIAGNOSIS — E785 Hyperlipidemia, unspecified: Secondary | ICD-10-CM | POA: Diagnosis not present

## 2019-12-01 DIAGNOSIS — K219 Gastro-esophageal reflux disease without esophagitis: Secondary | ICD-10-CM | POA: Diagnosis not present

## 2019-12-01 DIAGNOSIS — I10 Essential (primary) hypertension: Secondary | ICD-10-CM | POA: Diagnosis not present

## 2019-12-01 DIAGNOSIS — I25119 Atherosclerotic heart disease of native coronary artery with unspecified angina pectoris: Secondary | ICD-10-CM | POA: Diagnosis not present

## 2020-02-02 DIAGNOSIS — M1711 Unilateral primary osteoarthritis, right knee: Secondary | ICD-10-CM | POA: Diagnosis not present

## 2020-02-10 ENCOUNTER — Other Ambulatory Visit: Payer: Self-pay

## 2020-02-10 DIAGNOSIS — I10 Essential (primary) hypertension: Secondary | ICD-10-CM

## 2020-02-10 DIAGNOSIS — I2510 Atherosclerotic heart disease of native coronary artery without angina pectoris: Secondary | ICD-10-CM

## 2020-02-10 MED ORDER — METOPROLOL TARTRATE 50 MG PO TABS: 50.0000 mg | ORAL_TABLET | Freq: Two times a day (BID) | ORAL | 3 refills | Status: DC

## 2020-02-10 NOTE — Telephone Encounter (Signed)
refilled lopressor 50 mg bid #180 to [facility]

## 2020-02-23 ENCOUNTER — Other Ambulatory Visit: Payer: Self-pay

## 2020-02-23 MED ORDER — PRAVASTATIN SODIUM 80 MG PO TABS: 80.0000 mg | ORAL_TABLET | Freq: Every day | ORAL | 0 refills | Status: DC

## 2020-02-23 NOTE — Telephone Encounter (Signed)
Refilled pravastatin. 

## 2020-03-08 DIAGNOSIS — Z712 Person consulting for explanation of examination or test findings: Secondary | ICD-10-CM | POA: Diagnosis not present

## 2020-03-08 DIAGNOSIS — N202 Calculus of kidney with calculus of ureter: Secondary | ICD-10-CM | POA: Diagnosis not present

## 2020-03-08 DIAGNOSIS — M1711 Unilateral primary osteoarthritis, right knee: Secondary | ICD-10-CM | POA: Diagnosis not present

## 2020-03-11 ENCOUNTER — Other Ambulatory Visit (HOSPITAL_COMMUNITY): Payer: Self-pay | Admitting: Internal Medicine

## 2020-03-11 ENCOUNTER — Other Ambulatory Visit: Payer: Self-pay | Admitting: Internal Medicine

## 2020-03-11 DIAGNOSIS — N202 Calculus of kidney with calculus of ureter: Secondary | ICD-10-CM

## 2020-03-14 DIAGNOSIS — N202 Calculus of kidney with calculus of ureter: Secondary | ICD-10-CM | POA: Diagnosis not present

## 2020-03-14 DIAGNOSIS — Z712 Person consulting for explanation of examination or test findings: Secondary | ICD-10-CM | POA: Diagnosis not present

## 2020-03-14 DIAGNOSIS — M1711 Unilateral primary osteoarthritis, right knee: Secondary | ICD-10-CM | POA: Diagnosis not present

## 2020-03-18 ENCOUNTER — Other Ambulatory Visit: Payer: Self-pay

## 2020-03-18 ENCOUNTER — Ambulatory Visit (HOSPITAL_COMMUNITY)
Admission: RE | Admit: 2020-03-18 | Discharge: 2020-03-18 | Disposition: A | Discharge status: Home / Self Care | Payer: BC Managed Care – PPO | Source: Ambulatory Visit | Attending: Internal Medicine | Admitting: Internal Medicine

## 2020-03-18 DIAGNOSIS — N202 Calculus of kidney with calculus of ureter: Secondary | ICD-10-CM

## 2020-03-18 DIAGNOSIS — N200 Calculus of kidney: Secondary | ICD-10-CM | POA: Diagnosis not present

## 2020-04-07 DIAGNOSIS — Z96652 Presence of left artificial knee joint: Secondary | ICD-10-CM | POA: Diagnosis not present

## 2020-04-07 DIAGNOSIS — M25561 Pain in right knee: Secondary | ICD-10-CM | POA: Diagnosis not present

## 2020-04-22 DIAGNOSIS — Z23 Encounter for immunization: Secondary | ICD-10-CM | POA: Diagnosis not present

## 2020-05-07 ENCOUNTER — Encounter: Payer: Self-pay | Admitting: Physician Assistant

## 2020-05-07 ENCOUNTER — Other Ambulatory Visit: Payer: Self-pay | Admitting: Physician Assistant

## 2020-05-07 DIAGNOSIS — F17201 Nicotine dependence, unspecified, in remission: Secondary | ICD-10-CM | POA: Insufficient documentation

## 2020-05-07 DIAGNOSIS — I82402 Acute embolism and thrombosis of unspecified deep veins of left lower extremity: Secondary | ICD-10-CM

## 2020-05-07 DIAGNOSIS — I10 Essential (primary) hypertension: Secondary | ICD-10-CM

## 2020-05-07 DIAGNOSIS — U071 COVID-19: Secondary | ICD-10-CM

## 2020-05-07 NOTE — Progress Notes (Signed)
I connected by phone with Timothy Shaw on 05/07/2020 at 10:27 AM to discuss the potential use of a new treatment for mild to moderate COVID-19 viral infection in non-hospitalized patients.  This patient is a 64 y.o. male that meets the FDA criteria for Emergency Use Authorization of COVID monoclonal antibody sotrovimab, casirivimab/imdevimab or bamlamivimab/estevimab.  Has a (+) direct SARS-CoV-2 viral test result  Has mild or moderate COVID-19   Is NOT hospitalized due to COVID-19  Is within 10 days of symptom onset  Has at least one of the high risk factor(s) for progression to severe COVID-19 and/or hospitalization as defined in EUA.  Specific high risk criteria : BMI > 25, Cardiovascular disease or hypertension and Chronic Lung Disease   I have spoken and communicated the following to the patient or parent/caregiver regarding COVID monoclonal antibody treatment:  1. FDA has authorized the emergency use for the treatment of mild to moderate COVID-19 in adults and pediatric patients with positive results of direct SARS-CoV-2 viral testing who are 43 years of age and older weighing at least 40 kg, and who are at high risk for progressing to severe COVID-19 and/or hospitalization.  2. The significant known and potential risks and benefits of COVID monoclonal antibody, and the extent to which such potential risks and benefits are unknown.  3. Information on available alternative treatments and the risks and benefits of those alternatives, including clinical trials.  4. Patients treated with COVID monoclonal antibody should continue to self-isolate and use infection control measures (e.g., wear mask, isolate, social distance, avoid sharing personal items, clean and disinfect "high touch" surfaces, and frequent handwashing) according to CDC guidelines.   5. The patient or parent/caregiver has the option to accept or refuse COVID monoclonal antibody treatment.  After reviewing this  information with the patient, the patient has agreed to receive one of the available covid 19 monoclonal antibodies and will be provided an appropriate fact sheet prior to infusion.  Sx onset 11/26. Set up for infusion on 12/6 @ 3:30pm. Directions given to [facility]. Pt is aware that insurance will be charged an infusion fee. Pt is fully vaccinated including the booster. Pt will bring in copy of + test.   [name] 05/07/2020 10:27 AM

## 2020-05-09 ENCOUNTER — Ambulatory Visit (HOSPITAL_COMMUNITY)
Admission: RE | Admit: 2020-05-09 | Discharge: 2020-05-09 | Disposition: A | Discharge status: Home / Self Care | Payer: BC Managed Care – PPO | Source: Ambulatory Visit | Attending: Pulmonary Disease | Admitting: Pulmonary Disease

## 2020-05-09 DIAGNOSIS — I10 Essential (primary) hypertension: Secondary | ICD-10-CM | POA: Insufficient documentation

## 2020-05-09 DIAGNOSIS — I82402 Acute embolism and thrombosis of unspecified deep veins of left lower extremity: Secondary | ICD-10-CM | POA: Insufficient documentation

## 2020-05-09 DIAGNOSIS — F17201 Nicotine dependence, unspecified, in remission: Secondary | ICD-10-CM | POA: Insufficient documentation

## 2020-05-09 DIAGNOSIS — U071 COVID-19: Secondary | ICD-10-CM

## 2020-05-09 MED ORDER — SODIUM CHLORIDE 0.9 % IV SOLN
1200.0000 mg | Freq: Once | INTRAVENOUS | Status: DC
  Administered 2020-05-09: 1200 mg via INTRAVENOUS

## 2020-05-09 MED ORDER — DIPHENHYDRAMINE HCL 50 MG/ML IJ SOLN: 50.0000 mg | Freq: Once | INTRAMUSCULAR | Status: DC | PRN

## 2020-05-09 MED ORDER — ALBUTEROL SULFATE HFA 108 (90 BASE) MCG/ACT IN AERS: 2.0000 | INHALATION_SPRAY | Freq: Once | RESPIRATORY_TRACT | Status: DC | PRN

## 2020-05-09 MED ORDER — EPINEPHRINE 0.3 MG/0.3ML IJ SOAJ: 0.3000 mg | Freq: Once | INTRAMUSCULAR | Status: DC | PRN

## 2020-05-09 MED ORDER — FAMOTIDINE IN NACL 20-0.9 MG/50ML-% IV SOLN: 20.0000 mg | Freq: Once | INTRAVENOUS | Status: DC | PRN

## 2020-05-09 MED ORDER — METHYLPREDNISOLONE SODIUM SUCC 125 MG IJ SOLR: 125.0000 mg | Freq: Once | INTRAMUSCULAR | Status: DC | PRN

## 2020-05-09 MED ORDER — SODIUM CHLORIDE 0.9 % IV SOLN: INTRAVENOUS | Status: DC | PRN

## 2020-05-09 NOTE — Discharge Instructions (Signed)
10 Things You Can Do to Manage Your COVID-19 Symptoms at Home If you have possible or confirmed COVID-19: 1. Stay home from work and school. And stay away from other public places. If you must go out, avoid using any kind of public transportation, ridesharing, or taxis. 2. Monitor your symptoms carefully. If your symptoms get worse, call your healthcare provider immediately. 3. Get rest and stay hydrated. 4. If you have a medical appointment, call the healthcare provider ahead of time and tell them that you have or may have COVID-19. 5. For medical emergencies, call 911 and notify the dispatch personnel that you have or may have COVID-19. 6. Cover your cough and sneezes with a tissue or use the inside of your elbow. 7. Wash your hands often with soap and water for at least 20 seconds or clean your hands with an alcohol-based hand sanitizer that contains at least 60% alcohol. 8. As much as possible, stay in a specific room and away from other people in your home. Also, you should use a separate bathroom, if available. If you need to be around other people in or outside of the home, wear a mask. 9. Avoid sharing personal items with other people in your household, like dishes, towels, and bedding. 10. Clean all surfaces that are touched often, like counters, tabletops, and doorknobs. Use household cleaning sprays or wipes according to the label instructions. cdc.gov/coronavirus 12/03/2018 This information is not intended to replace advice given to you by your health care provider. Make sure you discuss any questions you have with your health care provider. Document Revised: 05/07/2019 Document Reviewed: 05/07/2019 Elsevier Patient Education  2020 Elsevier Inc. What types of side effects do monoclonal antibody drugs cause?  Common side effects  In general, the more common side effects caused by monoclonal antibody drugs include: . Allergic reactions, such as hives or itching . Flu-like signs and  symptoms, including chills, fatigue, fever, and muscle aches and pains . Nausea, vomiting . Diarrhea . Skin rashes . Low blood pressure   The CDC is recommending patients who receive monoclonal antibody treatments wait at least 90 days before being vaccinated.  Currently, there are no data on the safety and efficacy of mRNA COVID-19 vaccines in persons who received monoclonal antibodies or convalescent plasma as part of COVID-19 treatment. Based on the estimated half-life of such therapies as well as evidence suggesting that reinfection is uncommon in the 90 days after initial infection, vaccination should be deferred for at least 90 days, as a precautionary measure until additional information becomes available, to avoid interference of the antibody treatment with vaccine-induced immune responses. If you have any questions or concerns after the infusion please call the Advanced Practice Provider on call at [phone]. This number is ONLY intended for your use regarding questions or concerns about the infusion post-treatment side-effects.  Please do not provide this number to others for use. For return to work notes please contact your primary care provider.   If someone you know is interested in receiving treatment please have them call the COVID hotline at [phone].   

## 2020-05-09 NOTE — Progress Notes (Signed)
Patient reviewed Fact Sheet for Patients, Parents, and Caregivers for Emergency Use Authorization (EUA) of Sotrovimab for the Treatment of Coronavirus. Patient also reviewed and is agreeable to the estimated cost of treatment. Patient is agreeable to proceed.   

## 2020-05-09 NOTE — Progress Notes (Signed)
  Diagnosis: COVID-19  Physician: Asencion Noble  Procedure: [facility] Infusion [facility] Med: casirivimab\imdevimab infusion - Provided patient with casirivimab\imdevimab fact sheet for patients, parents and caregivers prior to infusion.  Complications: No immediate complications noted.  Discharge: Discharged home   Dorene Sorrow 05/09/2020

## 2020-05-12 ENCOUNTER — Other Ambulatory Visit (HOSPITAL_COMMUNITY): Payer: Self-pay

## 2020-05-26 DIAGNOSIS — K219 Gastro-esophageal reflux disease without esophagitis: Secondary | ICD-10-CM | POA: Diagnosis not present

## 2020-05-26 DIAGNOSIS — E785 Hyperlipidemia, unspecified: Secondary | ICD-10-CM | POA: Diagnosis not present

## 2020-05-26 DIAGNOSIS — Z712 Person consulting for explanation of examination or test findings: Secondary | ICD-10-CM | POA: Diagnosis not present

## 2020-05-26 DIAGNOSIS — R7303 Prediabetes: Secondary | ICD-10-CM | POA: Diagnosis not present

## 2020-05-26 DIAGNOSIS — I10 Essential (primary) hypertension: Secondary | ICD-10-CM | POA: Diagnosis not present

## 2020-05-26 DIAGNOSIS — I25119 Atherosclerotic heart disease of native coronary artery with unspecified angina pectoris: Secondary | ICD-10-CM | POA: Diagnosis not present

## 2020-05-28 ENCOUNTER — Other Ambulatory Visit: Payer: Self-pay | Admitting: Student

## 2020-06-02 DIAGNOSIS — K219 Gastro-esophageal reflux disease without esophagitis: Secondary | ICD-10-CM | POA: Diagnosis not present

## 2020-06-02 DIAGNOSIS — I10 Essential (primary) hypertension: Secondary | ICD-10-CM | POA: Diagnosis not present

## 2020-06-02 DIAGNOSIS — I25119 Atherosclerotic heart disease of native coronary artery with unspecified angina pectoris: Secondary | ICD-10-CM | POA: Diagnosis not present

## 2020-06-02 DIAGNOSIS — E785 Hyperlipidemia, unspecified: Secondary | ICD-10-CM | POA: Diagnosis not present

## 2020-06-21 DIAGNOSIS — Z0189 Encounter for other specified special examinations: Secondary | ICD-10-CM | POA: Diagnosis not present

## 2020-06-23 ENCOUNTER — Telehealth: Payer: Self-pay | Admitting: Student

## 2020-06-23 NOTE — Telephone Encounter (Signed)
   [facility] Pre-operative Risk Assessment    HEARTCARE STAFF: - Please ensure there is not already an duplicate clearance open for this procedure. - Under Visit Info/Reason for Call, type in Other and utilize the format Clearance MM/DD/YY or Clearance TBD. Do not use dashes or single digits. - If request is for dental extraction, please clarify the # of teeth to be extracted.  Request for surgical clearance:  1. What type of surgery is being performed? Right total knee arthoplasty  2. When is this surgery scheduled? 06/28/2020  3. What type of clearance is required (medical clearance vs. Pharmacy clearance to hold med vs. Both)? both  4. Are there any medications that need to be held prior to surgery and how long? aspirin  5. Practice name and name of physician performing surgery? [facility] Dr Hector Shade  What is the office phone number? [phone]  7.   What is the office fax number?  [phone]   8.   Anesthesia type (None, local, MAC, general) ? tbd   Jannet Askew 06/23/2020, 11:42 AM  _________________________________________________________________   (provider comments below)

## 2020-06-23 NOTE — Telephone Encounter (Addendum)
   Primary Cardiologist: Kate Sable, MD (Inactive)  Chart reviewed as part of pre-operative protocol coverage. Patient was contacted 06/23/2020 in reference to pre-operative risk assessment for pending surgery as outlined below.  BASEM YANNUZZI was last seen on 09/14/19 by Dr. Bronson Ing.  Since that day, PERL KERNEY has done well. He has no new or worsening anginal symptoms.   Therefore, based on ACC/AHA guidelines, the patient would be at acceptable risk for the planned procedure without further cardiovascular testing.    With a history of CAD we recommend continuation of Aspirin throughout the perioperative period, but if the bleeding risk is felt to be too high, okay to hold 5 days.   He tells me his orthopedic surgeon has asked him to hold 5 days prior to procedure.   The patient was advised that if he develops new symptoms prior to surgery to contact our office to arrange for a follow-up visit, and he verbalized understanding.  I will route this recommendation to the requesting party via Epic fax function and remove from pre-op pool. Please call with questions.  Loel Dubonnet, NP 06/23/2020, 3:30 PM  Mr. Ilai Hiller is a 65 yo male last seen by Dr. Bronson Ing 09/14/19.

## 2020-06-28 DIAGNOSIS — M1711 Unilateral primary osteoarthritis, right knee: Secondary | ICD-10-CM | POA: Diagnosis not present

## 2020-06-30 DIAGNOSIS — R531 Weakness: Secondary | ICD-10-CM | POA: Diagnosis not present

## 2020-06-30 DIAGNOSIS — M25661 Stiffness of right knee, not elsewhere classified: Secondary | ICD-10-CM | POA: Diagnosis not present

## 2020-06-30 DIAGNOSIS — Z471 Aftercare following joint replacement surgery: Secondary | ICD-10-CM | POA: Diagnosis not present

## 2020-06-30 DIAGNOSIS — M25561 Pain in right knee: Secondary | ICD-10-CM | POA: Diagnosis not present

## 2020-07-04 DIAGNOSIS — Z471 Aftercare following joint replacement surgery: Secondary | ICD-10-CM | POA: Diagnosis not present

## 2020-07-04 DIAGNOSIS — R531 Weakness: Secondary | ICD-10-CM | POA: Diagnosis not present

## 2020-07-04 DIAGNOSIS — M25561 Pain in right knee: Secondary | ICD-10-CM | POA: Diagnosis not present

## 2020-07-04 DIAGNOSIS — M25661 Stiffness of right knee, not elsewhere classified: Secondary | ICD-10-CM | POA: Diagnosis not present

## 2020-07-05 DIAGNOSIS — R531 Weakness: Secondary | ICD-10-CM | POA: Diagnosis not present

## 2020-07-05 DIAGNOSIS — M25561 Pain in right knee: Secondary | ICD-10-CM | POA: Diagnosis not present

## 2020-07-05 DIAGNOSIS — M25661 Stiffness of right knee, not elsewhere classified: Secondary | ICD-10-CM | POA: Diagnosis not present

## 2020-07-05 DIAGNOSIS — Z471 Aftercare following joint replacement surgery: Secondary | ICD-10-CM | POA: Diagnosis not present

## 2020-07-07 DIAGNOSIS — R531 Weakness: Secondary | ICD-10-CM | POA: Diagnosis not present

## 2020-07-07 DIAGNOSIS — M25661 Stiffness of right knee, not elsewhere classified: Secondary | ICD-10-CM | POA: Diagnosis not present

## 2020-07-07 DIAGNOSIS — Z471 Aftercare following joint replacement surgery: Secondary | ICD-10-CM | POA: Diagnosis not present

## 2020-07-07 DIAGNOSIS — M25561 Pain in right knee: Secondary | ICD-10-CM | POA: Diagnosis not present

## 2020-07-12 DIAGNOSIS — R531 Weakness: Secondary | ICD-10-CM | POA: Diagnosis not present

## 2020-07-12 DIAGNOSIS — M25561 Pain in right knee: Secondary | ICD-10-CM | POA: Diagnosis not present

## 2020-07-12 DIAGNOSIS — M25661 Stiffness of right knee, not elsewhere classified: Secondary | ICD-10-CM | POA: Diagnosis not present

## 2020-07-12 DIAGNOSIS — Z471 Aftercare following joint replacement surgery: Secondary | ICD-10-CM | POA: Diagnosis not present

## 2020-07-14 DIAGNOSIS — M25561 Pain in right knee: Secondary | ICD-10-CM | POA: Diagnosis not present

## 2020-07-14 DIAGNOSIS — R531 Weakness: Secondary | ICD-10-CM | POA: Diagnosis not present

## 2020-07-14 DIAGNOSIS — M25661 Stiffness of right knee, not elsewhere classified: Secondary | ICD-10-CM | POA: Diagnosis not present

## 2020-07-14 DIAGNOSIS — Z471 Aftercare following joint replacement surgery: Secondary | ICD-10-CM | POA: Diagnosis not present

## 2020-07-15 DIAGNOSIS — M25661 Stiffness of right knee, not elsewhere classified: Secondary | ICD-10-CM | POA: Diagnosis not present

## 2020-07-15 DIAGNOSIS — Z471 Aftercare following joint replacement surgery: Secondary | ICD-10-CM | POA: Diagnosis not present

## 2020-07-15 DIAGNOSIS — R531 Weakness: Secondary | ICD-10-CM | POA: Diagnosis not present

## 2020-07-15 DIAGNOSIS — M25561 Pain in right knee: Secondary | ICD-10-CM | POA: Diagnosis not present

## 2020-07-18 DIAGNOSIS — M25561 Pain in right knee: Secondary | ICD-10-CM | POA: Diagnosis not present

## 2020-07-18 DIAGNOSIS — M25661 Stiffness of right knee, not elsewhere classified: Secondary | ICD-10-CM | POA: Diagnosis not present

## 2020-07-18 DIAGNOSIS — R531 Weakness: Secondary | ICD-10-CM | POA: Diagnosis not present

## 2020-07-18 DIAGNOSIS — Z471 Aftercare following joint replacement surgery: Secondary | ICD-10-CM | POA: Diagnosis not present

## 2020-07-20 DIAGNOSIS — Z471 Aftercare following joint replacement surgery: Secondary | ICD-10-CM | POA: Diagnosis not present

## 2020-07-20 DIAGNOSIS — M25661 Stiffness of right knee, not elsewhere classified: Secondary | ICD-10-CM | POA: Diagnosis not present

## 2020-07-20 DIAGNOSIS — M25561 Pain in right knee: Secondary | ICD-10-CM | POA: Diagnosis not present

## 2020-07-20 DIAGNOSIS — R531 Weakness: Secondary | ICD-10-CM | POA: Diagnosis not present

## 2020-07-22 DIAGNOSIS — M25661 Stiffness of right knee, not elsewhere classified: Secondary | ICD-10-CM | POA: Diagnosis not present

## 2020-07-22 DIAGNOSIS — R531 Weakness: Secondary | ICD-10-CM | POA: Diagnosis not present

## 2020-07-22 DIAGNOSIS — Z471 Aftercare following joint replacement surgery: Secondary | ICD-10-CM | POA: Diagnosis not present

## 2020-07-22 DIAGNOSIS — M25561 Pain in right knee: Secondary | ICD-10-CM | POA: Diagnosis not present

## 2020-07-28 DIAGNOSIS — R531 Weakness: Secondary | ICD-10-CM | POA: Diagnosis not present

## 2020-07-28 DIAGNOSIS — M25661 Stiffness of right knee, not elsewhere classified: Secondary | ICD-10-CM | POA: Diagnosis not present

## 2020-07-28 DIAGNOSIS — Z471 Aftercare following joint replacement surgery: Secondary | ICD-10-CM | POA: Diagnosis not present

## 2020-07-28 DIAGNOSIS — M25561 Pain in right knee: Secondary | ICD-10-CM | POA: Diagnosis not present

## 2020-08-01 DIAGNOSIS — Z471 Aftercare following joint replacement surgery: Secondary | ICD-10-CM | POA: Diagnosis not present

## 2020-08-01 DIAGNOSIS — M25561 Pain in right knee: Secondary | ICD-10-CM | POA: Diagnosis not present

## 2020-08-01 DIAGNOSIS — R531 Weakness: Secondary | ICD-10-CM | POA: Diagnosis not present

## 2020-08-01 DIAGNOSIS — M25661 Stiffness of right knee, not elsewhere classified: Secondary | ICD-10-CM | POA: Diagnosis not present

## 2020-08-02 DIAGNOSIS — Z471 Aftercare following joint replacement surgery: Secondary | ICD-10-CM | POA: Diagnosis not present

## 2020-08-02 DIAGNOSIS — Z96651 Presence of right artificial knee joint: Secondary | ICD-10-CM | POA: Diagnosis not present

## 2020-08-15 ENCOUNTER — Telehealth: Payer: Self-pay | Admitting: Student

## 2020-08-15 MED ORDER — AMLODIPINE BESYLATE 5 MG PO TABS
5.0000 mg | ORAL_TABLET | Freq: Every day | ORAL | 0 refills | Status: DC
Start: 2020-08-15 — End: 2020-11-11

## 2020-08-15 NOTE — Telephone Encounter (Signed)
Medication refill request approved by Bernerd Pho, PA-C for 90 days. Patient needs OV with Cardiology for further refills.

## 2020-08-15 NOTE — Telephone Encounter (Signed)
    1. Which medications need to be refilled? (please list name of each medication and dose if known)  AMLODIPINE 5 MG   2. Which pharmacy/location (including street and city if local pharmacy) is medication to be sent to?  [facility]   [address] [place]   3. Do they need a 30 day or 90 day supply?  42 DAYS   [facility] told patient that they have been sending over request.

## 2020-09-08 DIAGNOSIS — I10 Essential (primary) hypertension: Secondary | ICD-10-CM | POA: Diagnosis not present

## 2020-09-08 DIAGNOSIS — Z0000 Encounter for general adult medical examination without abnormal findings: Secondary | ICD-10-CM | POA: Diagnosis not present

## 2020-09-08 DIAGNOSIS — E785 Hyperlipidemia, unspecified: Secondary | ICD-10-CM | POA: Diagnosis not present

## 2020-09-08 DIAGNOSIS — Z712 Person consulting for explanation of examination or test findings: Secondary | ICD-10-CM | POA: Diagnosis not present

## 2020-09-12 NOTE — Progress Notes (Signed)
Cardiology Office Note    Date:  09/13/2020   ID:  Timothy Shaw, DOB 1955-07-26, MRN [id number]  PCP:  Celene Squibb, MD  Cardiologist: Kate Sable, MD (Inactive)  --> Needs to switch to new MD  Chief Complaint  Patient presents with  . Follow-up    Annual Visit    History of Present Illness:    Timothy Shaw is a 65 y.o. male with past medical history of CAD (s/p cath in 2011 showing CTO of RCA and medical management pursued), HTN, HLD and history of DVT who presents to the office today for annual follow-up.   He was last examined by Dr. Bronson Ing in 09/2019 and denied any recent chest pain but did report dyspnea on exertion which was felt to be secondary to deconditioning since recent knee replacement. No changes were made to his medication regimen at that time.    In talking with the patient today, he reports his activity has been more limited over the past year given recent knee replacement. He participated in physical therapy and denies any anginal symptoms with this (including while walking on the treadmill) but over the past few weeks he has noticed episodes of chest discomfort with activity such as working in his yard. Symptoms typically improve with rest and he reports they resemble his prior symptoms from 2011. Breathing overall more labored as well but he has felt this is secondary to his decreased activity. He denies any orthopnea, PND or lower extremity edema.   Past Medical History:  Diagnosis Date  . Acute embolism and thrombosis of unspecified deep veins of left lower extremity ([place])   . Anemia   . Anxiety   . Anxiety disorder   . Arteriosclerotic cardiovascular disease (ASCVD)    with angina: 09/2009-100% proximal RCA; 50% proximal&60% mid LAD; 30-50% mid-Cx, normal EF  . Arthritis    Lyme disease in 10/2009 treated with antibiotics  . CAD (coronary artery disease)    a. s/p cath in 2011 showing CTO of RCA and medical management pursued  . Chronic  headaches   . Clotting disorder ([place])   . Cutaneous abscess of right lower limb   . Gastroesophageal reflux disease   . Hyperlipidemia    Total cholesterol-136, triglycerides of 89, HDL 48 and LDL of 70 in 03/2010  . Hypertension    Left ventricular hypertrophy  . Polyosteoarthritis, unspecified   . PONV (postoperative nausea and vomiting)   . Tobacco abuse, in remission    20 pack years; mild airway obstruction on PFTs in 2010    Past Surgical History:  Procedure Laterality Date  . APPENDECTOMY  1970  . COLONOSCOPY  2008  . COLONOSCOPY N/A 12/11/2018   Procedure: COLONOSCOPY;  Surgeon: Rogene Houston, MD;  Location: [facility];  Service: Endoscopy;  Laterality: N/A;  730  . HERNIA REPAIR    . inguinal hernia repair    . KNEE ARTHROSCOPY W/ MENISCAL REPAIR  2006, 2012  . NASAL SINUS SURGERY    . POLYPECTOMY  12/11/2018   Procedure: POLYPECTOMY;  Surgeon: Rogene Houston, MD;  Location: [facility];  Service: Endoscopy;;  ascending colon  . REPLACEMENT TOTAL KNEE     Mar 17 2019   . REPLACEMENT TOTAL KNEE Right   . SPINE SURGERY    . TONSILLECTOMY  1962  . UMBILICAL HERNIA REPAIR  2003    Current Medications: Outpatient Medications Prior to Visit  Medication Sig Dispense Refill  . amLODipine (  NORVASC) 5 MG tablet Take 1 tablet (5 mg total) by mouth daily. 90 tablet 0  . aspirin EC 81 MG tablet Take 1 tablet (81 mg total) by mouth daily.    Marland Kitchen esomeprazole (NEXIUM) 20 MG capsule Take 20 mg by mouth daily before breakfast. Over the counter    . meloxicam (MOBIC) 15 MG tablet Take 15 mg by mouth daily.     . metoprolol tartrate (LOPRESSOR) 50 MG tablet Take 1 tablet (50 mg total) by mouth 2 (two) times daily. 180 tablet 3  . Multiple Vitamins-Iron (MULTIVITAMIN PLUS IRON ADULT) TABS Take by mouth.    . pravastatin (PRAVACHOL) 80 MG tablet TAKE 1 TABLET(80 MG) BY MOUTH DAILY 90 tablet 1  . vitamin E 400 UNIT capsule Take 400 Units by mouth daily.     No  facility-administered medications prior to visit.     Allergies:   Patient has no known allergies.   Social History   Socioeconomic History  . Marital status: Married    Spouse name: Not on file  . Number of children: 1  . Years of education: Not on file  . Highest education level: Not on file  Occupational History  . Occupation: Air traffic controller  Tobacco Use  . Smoking status: Former Smoker    Packs/day: 1.00    Years: 20.00    Pack years: 20.00    Types: Cigarettes    Start date: 06/29/1971    Quit date: 06/04/1988    Years since quitting: 32.2  . Smokeless tobacco: Never Used  Vaping Use  . Vaping Use: Never used  Substance and Sexual Activity  . Alcohol use: Not Currently    Alcohol/week: 1.0 standard drink    Types: 1 Standard drinks or equivalent per week  . Drug use: No  . Sexual activity: Yes  Other Topics Concern  . Not on file  Social History Narrative  . Not on file   Social Determinants of Health   Financial Resource Strain: Not on file  Food Insecurity: Not on file  Transportation Needs: Not on file  Physical Activity: Not on file  Stress: Not on file  Social Connections: Not on file     Family History:  The patient's family history includes Breast cancer in his sister; Colon cancer in his sister and another family member; Heart disease in his father and mother; Heart failure in an other family member; Hyperlipidemia in his father and mother; Hypertension in his father and mother; Lupus in an other family member.   Review of Systems:   Please see the history of present illness.     General:  No chills, fever, night sweats or weight changes.  Cardiovascular:  No edema, orthopnea, paroxysmal nocturnal dyspnea. Positive for chest pain.  Dermatological: No rash, lesions/masses Respiratory: No cough, dyspnea Urologic: No hematuria, dysuria Abdominal:   No nausea, vomiting, diarrhea, bright red blood per rectum, melena, or hematemesis Neurologic:   No visual changes, wkns, changes in mental status. All other systems reviewed and are otherwise negative except as noted above.   Physical Exam:    VS:  BP 128/82   Pulse (!) 55   Ht 6\' 2"  (1.88 m)   Wt 207 lb 6.4 oz (94.1 kg)   SpO2 97%   BMI 26.63 kg/m    General: Well developed, well nourished,male appearing in no acute distress. Head: Normocephalic, atraumatic. Neck: No carotid bruits. JVD not elevated.  Lungs: Respirations regular and unlabored, without wheezes or rales.  Heart: Regular rhythm, bradycardiac rate. No S3 or S4.  No murmur, no rubs, or gallops appreciated. Abdomen: Appears non-distended. No obvious abdominal masses. Msk:  Strength and tone appear normal for age. No obvious joint deformities or effusions. Extremities: No clubbing or cyanosis. No edema.  Distal pedal pulses are 2+ bilaterally. Neuro: Alert and oriented X 3. Moves all extremities spontaneously. No focal deficits noted. Psych:  Responds to questions appropriately with a normal affect. Skin: No rashes or lesions noted  Wt Readings from Last 3 Encounters:  09/13/20 207 lb 6.4 oz (94.1 kg)  09/17/19 200 lb (90.7 kg)  09/14/19 201 lb (91.2 kg)     Studies/Labs Reviewed:   EKG:  EKG is ordered today.  The ekg ordered today demonstrates sinus bradycardia, HR 55 with LVH. No acute ST changes when compared to prior tracings.   Recent Labs: No results found for requested labs within last 8760 hours.   Lipid Panel    Component Value Date/Time   CHOL 120 04/14/2019 0000   TRIG 49 04/14/2019 0000   TRIG 70 01/17/2010 0000   HDL 44 04/14/2019 0000   CHOLHDL 3.0 02/26/2014 0848   VLDL 19 02/26/2014 0848   LDLCALC 62 04/14/2019 0000   LDLCALC 82 01/17/2010 0000    Additional studies/ records that were reviewed today include:   Renal US: 03/2020 IMPRESSION: 1. 3 to 4 mm renal echogenic foci bilaterally which could reflect nonobstructing calculi. 2. Normal renal size.  Assessment:    1.  Coronary artery disease involving native coronary artery of native heart with other form of angina pectoris (HCC)   2. Chest pain, unspecified type   3. Essential hypertension   4. Hyperlipidemia LDL goal <70      Plan:   In order of problems listed above:  1. CAD/Chest Pain with Exertion - The patient has a known CTO of the RCA by prior catheterization in 2011 and medical management was pursued at that time. He reports recently being able to walk on a treadmill with PT and denied any anginal symptoms with this but has noticed some episodes of chest discomfort since being more active in his yard. Given his newer symptoms and the time since his last ischemic evaluation, will plan for a Lexiscan Myoview. We discussed both a treadmill and Lexiscan but will pursue Lexiscan given his known back pain and recent knee surgery.  - Continue ASA 81mg  daily, Lopressor 50mg  BID and Pravastatin 80mg  daily.   2. HTN - BP is well controlled at 128/82 during today's visit. Continue current medication regimen with Amlodipine 5 mg daily and Lopressor 50 mg twice daily.  3. HLD - His LDL was elevated to 107 by review of labs from his PCP in 05/2020. He reports making significant dietary changes since then given he was diagnosed with prediabetes at that time as well.  He did have repeat labs with his PCP earlier this month and I will request a copy of these. He remains on Pravastatin 80 mg daily.  Will continue with annual follow-up in 1 year unless stress testing is abnormal or he develops worsening symptoms.    Shared Decision Making/Informed Consent:   Shared Decision Making/Informed Consent The risks [chest pain, shortness of breath, cardiac arrhythmias, dizziness, blood pressure fluctuations, myocardial infarction, stroke/transient ischemic attack, nausea, vomiting, allergic reaction, radiation exposure, metallic taste sensation and life-threatening complications (estimated to be 1 in 10,000)], benefits  (risk stratification, diagnosing coronary artery disease, treatment guidance) and alternatives of a nuclear  stress test were discussed in detail with Mr. Laforte and he agrees to proceed.       Medication Adjustments/Labs and Tests Ordered: Current medicines are reviewed at length with the patient today.  Concerns regarding medicines are outlined above.  Medication changes, Labs and Tests ordered today are listed in the Patient Instructions below. Patient Instructions  Medication Instructions:  Your physician recommends that you continue on your current medications as directed. Please refer to the Current Medication list given to you today.  *If you need a refill on your cardiac medications before your next appointment, please call your pharmacy*   Lab Work: NONE   If you have labs (blood work) drawn today and your tests are completely normal, you will receive your results only by: Marland Kitchen MyChart Message (if you have MyChart) OR . A paper copy in the mail If you have any lab test that is abnormal or we need to change your treatment, we will call you to review the results.   Testing/Procedures: Your physician has requested that you have a lexiscan myoview. For further information please visit [email or web address]. Please follow instruction sheet, as given.   Follow-Up: At [facility], you and your health needs are our priority.  As part of our continuing mission to provide you with exceptional heart care, we have created designated Provider Care Teams.  These Care Teams include your primary Cardiologist (physician) and Advanced Practice Providers (APPs -  Physician Assistants and Nurse Practitioners) who all work together to provide you with the care you need, when you need it.  We recommend signing up for the patient portal called "MyChart".  Sign up information is provided on this After Visit Summary.  MyChart is used to connect with patients for Virtual Visits (Telemedicine).  Patients  are able to view lab/test results, encounter notes, upcoming appointments, etc.  Non-urgent messages can be sent to your provider as well.   To learn more about what you can do with MyChart, go to [email or web address].    Your next appointment:   1 year(s)  The format for your next appointment:   In Person  Provider:   Bernerd Pho, PA-C   Other Instructions Thank you for choosing [facility]!       Signed, Erma Heritage, PA-C  09/13/2020 6:22 PM    [facility] [address] [place], [place] [address] Phone: [phone] Fax: [phone]

## 2020-09-13 ENCOUNTER — Encounter: Payer: Self-pay | Admitting: *Deleted

## 2020-09-13 ENCOUNTER — Ambulatory Visit: Payer: BC Managed Care – PPO | Admitting: Student

## 2020-09-13 ENCOUNTER — Other Ambulatory Visit: Payer: Self-pay

## 2020-09-13 ENCOUNTER — Encounter: Payer: Self-pay | Admitting: Student

## 2020-09-13 VITALS — BP 128/82 | HR 55 | Ht 74.0 in | Wt 207.4 lb

## 2020-09-13 DIAGNOSIS — R079 Chest pain, unspecified: Secondary | ICD-10-CM

## 2020-09-13 DIAGNOSIS — E785 Hyperlipidemia, unspecified: Secondary | ICD-10-CM | POA: Diagnosis not present

## 2020-09-13 DIAGNOSIS — I10 Essential (primary) hypertension: Secondary | ICD-10-CM

## 2020-09-13 DIAGNOSIS — I25118 Atherosclerotic heart disease of native coronary artery with other forms of angina pectoris: Secondary | ICD-10-CM

## 2020-09-13 NOTE — Patient Instructions (Signed)
Medication Instructions:  Your physician recommends that you continue on your current medications as directed. Please refer to the Current Medication list given to you today.  *If you need a refill on your cardiac medications before your next appointment, please call your pharmacy*   Lab Work: NONE   If you have labs (blood work) drawn today and your tests are completely normal, you will receive your results only by: Marland Kitchen MyChart Message (if you have MyChart) OR . A paper copy in the mail If you have any lab test that is abnormal or we need to change your treatment, we will call you to review the results.   Testing/Procedures: Your physician has requested that you have a lexiscan myoview. For further information please visit [email or web address]. Please follow instruction sheet, as given.   Follow-Up: At [facility], you and your health needs are our priority.  As part of our continuing mission to provide you with exceptional heart care, we have created designated Provider Care Teams.  These Care Teams include your primary Cardiologist (physician) and Advanced Practice Providers (APPs -  Physician Assistants and Nurse Practitioners) who all work together to provide you with the care you need, when you need it.  We recommend signing up for the patient portal called "MyChart".  Sign up information is provided on this After Visit Summary.  MyChart is used to connect with patients for Virtual Visits (Telemedicine).  Patients are able to view lab/test results, encounter notes, upcoming appointments, etc.  Non-urgent messages can be sent to your provider as well.   To learn more about what you can do with MyChart, go to [email or web address].    Your next appointment:   1 year(s)  The format for your next appointment:   In Person  Provider:   Bernerd Pho, PA-C   Other Instructions Thank you for choosing [facility]!

## 2020-09-15 DIAGNOSIS — I10 Essential (primary) hypertension: Secondary | ICD-10-CM | POA: Diagnosis not present

## 2020-09-15 DIAGNOSIS — E785 Hyperlipidemia, unspecified: Secondary | ICD-10-CM | POA: Diagnosis not present

## 2020-09-15 DIAGNOSIS — I25119 Atherosclerotic heart disease of native coronary artery with unspecified angina pectoris: Secondary | ICD-10-CM | POA: Diagnosis not present

## 2020-09-15 DIAGNOSIS — K219 Gastro-esophageal reflux disease without esophagitis: Secondary | ICD-10-CM | POA: Diagnosis not present

## 2020-09-16 ENCOUNTER — Encounter: Payer: Self-pay | Admitting: Cardiology

## 2020-09-22 ENCOUNTER — Ambulatory Visit (HOSPITAL_COMMUNITY)
Admission: RE | Admit: 2020-09-22 | Discharge: 2020-09-22 | Disposition: A | Discharge status: Home / Self Care | Payer: BC Managed Care – PPO | Source: Ambulatory Visit | Attending: Student | Admitting: Student

## 2020-09-22 ENCOUNTER — Other Ambulatory Visit: Payer: Self-pay

## 2020-09-22 DIAGNOSIS — R079 Chest pain, unspecified: Secondary | ICD-10-CM | POA: Insufficient documentation

## 2020-09-22 DIAGNOSIS — I25118 Atherosclerotic heart disease of native coronary artery with other forms of angina pectoris: Secondary | ICD-10-CM | POA: Insufficient documentation

## 2020-09-22 LAB — NM MYOCAR MULTI W/SPECT W/WALL MOTION / EF
LV dias vol: 143 mL (ref 62–150)
LV sys vol: 53 mL
Peak HR: 75 {beats}/min
RATE: 0.26
Rest HR: 48 {beats}/min
SDS: 5
SRS: 2
SSS: 7
TID: 1.13

## 2020-09-22 MED ORDER — TECHNETIUM TC 99M TETROFOSMIN IV KIT
10.0000 | PACK | Freq: Once | INTRAVENOUS | Status: AC | PRN
  Administered 2020-09-22: 9.5 via INTRAVENOUS

## 2020-09-22 MED ORDER — SODIUM CHLORIDE FLUSH 0.9 % IV SOLN
INTRAVENOUS | Status: AC
  Administered 2020-09-22: 10 mL via INTRAVENOUS
  Filled 2020-09-22: qty 10

## 2020-09-22 MED ORDER — TECHNETIUM TC 99M TETROFOSMIN IV KIT
30.0000 | PACK | Freq: Once | INTRAVENOUS | Status: AC | PRN
  Administered 2020-09-22: 30.2 via INTRAVENOUS

## 2020-09-22 MED ORDER — REGADENOSON 0.4 MG/5ML IV SOLN
INTRAVENOUS | Status: AC
  Administered 2020-09-22: 0.4 mg via INTRAVENOUS
  Filled 2020-09-22: qty 5

## 2020-10-18 ENCOUNTER — Other Ambulatory Visit: Payer: Self-pay | Admitting: Student

## 2020-10-18 NOTE — Telephone Encounter (Signed)
This is a [place] pt.  °

## 2020-11-10 ENCOUNTER — Other Ambulatory Visit: Payer: Self-pay | Admitting: Student

## 2020-11-11 NOTE — Telephone Encounter (Signed)
This is a [place] pt.  °

## 2020-12-01 DIAGNOSIS — Z23 Encounter for immunization: Secondary | ICD-10-CM | POA: Diagnosis not present

## 2021-01-16 ENCOUNTER — Other Ambulatory Visit: Payer: Self-pay | Admitting: Student

## 2021-01-16 DIAGNOSIS — I2510 Atherosclerotic heart disease of native coronary artery without angina pectoris: Secondary | ICD-10-CM

## 2021-01-16 DIAGNOSIS — I10 Essential (primary) hypertension: Secondary | ICD-10-CM

## 2021-03-16 DIAGNOSIS — M25511 Pain in right shoulder: Secondary | ICD-10-CM | POA: Diagnosis not present

## 2021-03-16 DIAGNOSIS — M79644 Pain in right finger(s): Secondary | ICD-10-CM | POA: Diagnosis not present

## 2021-03-16 DIAGNOSIS — M79645 Pain in left finger(s): Secondary | ICD-10-CM | POA: Diagnosis not present

## 2021-03-16 DIAGNOSIS — M65341 Trigger finger, right ring finger: Secondary | ICD-10-CM | POA: Diagnosis not present

## 2021-03-21 DIAGNOSIS — I10 Essential (primary) hypertension: Secondary | ICD-10-CM | POA: Diagnosis not present

## 2021-03-21 DIAGNOSIS — R7303 Prediabetes: Secondary | ICD-10-CM | POA: Diagnosis not present

## 2021-03-23 DIAGNOSIS — Z23 Encounter for immunization: Secondary | ICD-10-CM | POA: Diagnosis not present

## 2021-03-28 DIAGNOSIS — E782 Mixed hyperlipidemia: Secondary | ICD-10-CM | POA: Diagnosis not present

## 2021-03-28 DIAGNOSIS — I25119 Atherosclerotic heart disease of native coronary artery with unspecified angina pectoris: Secondary | ICD-10-CM | POA: Diagnosis not present

## 2021-03-28 DIAGNOSIS — I10 Essential (primary) hypertension: Secondary | ICD-10-CM | POA: Diagnosis not present

## 2021-03-28 DIAGNOSIS — K219 Gastro-esophageal reflux disease without esophagitis: Secondary | ICD-10-CM | POA: Diagnosis not present

## 2021-05-07 ENCOUNTER — Other Ambulatory Visit: Payer: Self-pay | Admitting: Student

## 2021-05-08 NOTE — Telephone Encounter (Signed)
This is a [place] pt

## 2021-06-26 DIAGNOSIS — M79644 Pain in right finger(s): Secondary | ICD-10-CM | POA: Diagnosis not present

## 2021-06-26 DIAGNOSIS — M79645 Pain in left finger(s): Secondary | ICD-10-CM | POA: Diagnosis not present

## 2021-06-26 DIAGNOSIS — M65341 Trigger finger, right ring finger: Secondary | ICD-10-CM | POA: Diagnosis not present

## 2021-06-30 DIAGNOSIS — M7542 Impingement syndrome of left shoulder: Secondary | ICD-10-CM | POA: Diagnosis not present

## 2021-06-30 DIAGNOSIS — M25512 Pain in left shoulder: Secondary | ICD-10-CM | POA: Diagnosis not present

## 2021-08-04 ENCOUNTER — Other Ambulatory Visit: Payer: Self-pay | Admitting: Student

## 2021-08-11 DIAGNOSIS — M7542 Impingement syndrome of left shoulder: Secondary | ICD-10-CM | POA: Diagnosis not present

## 2021-09-22 DIAGNOSIS — N400 Enlarged prostate without lower urinary tract symptoms: Secondary | ICD-10-CM | POA: Diagnosis not present

## 2021-09-22 DIAGNOSIS — E782 Mixed hyperlipidemia: Secondary | ICD-10-CM | POA: Diagnosis not present

## 2021-09-22 DIAGNOSIS — R7303 Prediabetes: Secondary | ICD-10-CM | POA: Diagnosis not present

## 2021-09-27 ENCOUNTER — Other Ambulatory Visit (HOSPITAL_COMMUNITY): Payer: Self-pay | Admitting: Internal Medicine

## 2021-09-27 ENCOUNTER — Other Ambulatory Visit: Payer: Self-pay | Admitting: Internal Medicine

## 2021-09-27 DIAGNOSIS — I10 Essential (primary) hypertension: Secondary | ICD-10-CM | POA: Diagnosis not present

## 2021-09-27 DIAGNOSIS — M5412 Radiculopathy, cervical region: Secondary | ICD-10-CM

## 2021-09-27 DIAGNOSIS — Z0001 Encounter for general adult medical examination with abnormal findings: Secondary | ICD-10-CM | POA: Diagnosis not present

## 2021-09-27 DIAGNOSIS — I25119 Atherosclerotic heart disease of native coronary artery with unspecified angina pectoris: Secondary | ICD-10-CM | POA: Diagnosis not present

## 2021-09-27 DIAGNOSIS — E782 Mixed hyperlipidemia: Secondary | ICD-10-CM | POA: Diagnosis not present

## 2021-09-27 DIAGNOSIS — K219 Gastro-esophageal reflux disease without esophagitis: Secondary | ICD-10-CM | POA: Diagnosis not present

## 2021-09-27 DIAGNOSIS — R200 Anesthesia of skin: Secondary | ICD-10-CM

## 2021-10-13 ENCOUNTER — Encounter (HOSPITAL_COMMUNITY): Payer: Self-pay

## 2021-10-13 ENCOUNTER — Ambulatory Visit (HOSPITAL_COMMUNITY): Payer: BC Managed Care – PPO

## 2021-10-26 ENCOUNTER — Other Ambulatory Visit: Payer: Self-pay | Admitting: Student

## 2021-10-26 DIAGNOSIS — I10 Essential (primary) hypertension: Secondary | ICD-10-CM

## 2021-10-26 DIAGNOSIS — I2510 Atherosclerotic heart disease of native coronary artery without angina pectoris: Secondary | ICD-10-CM

## 2021-11-01 ENCOUNTER — Ambulatory Visit (HOSPITAL_COMMUNITY)
Admission: RE | Admit: 2021-11-01 | Discharge: 2021-11-01 | Disposition: A | Discharge status: Home / Self Care | Payer: BC Managed Care – PPO | Source: Ambulatory Visit | Attending: Internal Medicine | Admitting: Internal Medicine

## 2021-11-01 DIAGNOSIS — R200 Anesthesia of skin: Secondary | ICD-10-CM | POA: Insufficient documentation

## 2021-11-01 DIAGNOSIS — M5412 Radiculopathy, cervical region: Secondary | ICD-10-CM | POA: Insufficient documentation

## 2021-11-14 DIAGNOSIS — J069 Acute upper respiratory infection, unspecified: Secondary | ICD-10-CM | POA: Diagnosis not present

## 2021-11-14 DIAGNOSIS — M5412 Radiculopathy, cervical region: Secondary | ICD-10-CM | POA: Diagnosis not present

## 2021-11-25 ENCOUNTER — Other Ambulatory Visit: Payer: Self-pay | Admitting: Student

## 2021-11-25 DIAGNOSIS — I10 Essential (primary) hypertension: Secondary | ICD-10-CM

## 2021-11-25 DIAGNOSIS — I2510 Atherosclerotic heart disease of native coronary artery without angina pectoris: Secondary | ICD-10-CM

## 2021-12-13 DIAGNOSIS — M5416 Radiculopathy, lumbar region: Secondary | ICD-10-CM | POA: Diagnosis not present

## 2021-12-13 DIAGNOSIS — Z6827 Body mass index (BMI) 27.0-27.9, adult: Secondary | ICD-10-CM | POA: Diagnosis not present

## 2021-12-21 ENCOUNTER — Other Ambulatory Visit: Payer: Self-pay | Admitting: Student

## 2021-12-21 DIAGNOSIS — I10 Essential (primary) hypertension: Secondary | ICD-10-CM

## 2021-12-21 DIAGNOSIS — I2510 Atherosclerotic heart disease of native coronary artery without angina pectoris: Secondary | ICD-10-CM

## 2021-12-27 ENCOUNTER — Telehealth: Payer: Self-pay | Admitting: Student

## 2021-12-27 DIAGNOSIS — I10 Essential (primary) hypertension: Secondary | ICD-10-CM

## 2021-12-27 DIAGNOSIS — I2510 Atherosclerotic heart disease of native coronary artery without angina pectoris: Secondary | ICD-10-CM

## 2021-12-27 MED ORDER — AMLODIPINE BESYLATE 5 MG PO TABS: ORAL_TABLET | ORAL | 0 refills | Status: DC

## 2021-12-27 MED ORDER — METOPROLOL TARTRATE 50 MG PO TABS: ORAL_TABLET | ORAL | 0 refills | Status: DC

## 2021-12-27 NOTE — Telephone Encounter (Signed)
90 day refill for both lopressor and amlodipine done

## 2021-12-27 NOTE — Telephone Encounter (Signed)
*  STAT* If patient is at the pharmacy, call can be transferred to refill team.   1. Which medications need to be refilled? (please list name of each medication and dose if known) amLODipine (NORVASC) 5 MG tablet  metoprolol tartrate (LOPRESSOR) 50 MG tablet  2. Which pharmacy/location (including street and city if local pharmacy) is medication to be sent to? [facility] [place] [place] AT [address] [place]  3. Do they need a 30 day or 90 day supply? 90  Patient has appt on 03/30/22 for [place]

## 2022-01-16 DIAGNOSIS — M47812 Spondylosis without myelopathy or radiculopathy, cervical region: Secondary | ICD-10-CM | POA: Diagnosis not present

## 2022-01-16 DIAGNOSIS — M4802 Spinal stenosis, cervical region: Secondary | ICD-10-CM | POA: Diagnosis not present

## 2022-02-01 DIAGNOSIS — M4802 Spinal stenosis, cervical region: Secondary | ICD-10-CM | POA: Diagnosis not present

## 2022-02-01 DIAGNOSIS — M5412 Radiculopathy, cervical region: Secondary | ICD-10-CM | POA: Diagnosis not present

## 2022-02-19 DIAGNOSIS — M5412 Radiculopathy, cervical region: Secondary | ICD-10-CM | POA: Diagnosis not present

## 2022-03-22 ENCOUNTER — Other Ambulatory Visit: Payer: Self-pay | Admitting: Cardiology

## 2022-03-22 ENCOUNTER — Other Ambulatory Visit: Payer: Self-pay | Admitting: Student

## 2022-03-22 DIAGNOSIS — I10 Essential (primary) hypertension: Secondary | ICD-10-CM

## 2022-03-22 DIAGNOSIS — I2510 Atherosclerotic heart disease of native coronary artery without angina pectoris: Secondary | ICD-10-CM

## 2022-03-23 DIAGNOSIS — N400 Enlarged prostate without lower urinary tract symptoms: Secondary | ICD-10-CM | POA: Diagnosis not present

## 2022-03-23 DIAGNOSIS — E782 Mixed hyperlipidemia: Secondary | ICD-10-CM | POA: Diagnosis not present

## 2022-03-23 DIAGNOSIS — R7303 Prediabetes: Secondary | ICD-10-CM | POA: Diagnosis not present

## 2022-03-23 MED ORDER — METOPROLOL TARTRATE 50 MG PO TABS: ORAL_TABLET | ORAL | 0 refills | Status: DC

## 2022-03-23 NOTE — Addendum Note (Signed)
Addended by: Berlinda Last on: 03/23/2022 07:55 AM   Modules accepted: Orders

## 2022-03-29 DIAGNOSIS — K219 Gastro-esophageal reflux disease without esophagitis: Secondary | ICD-10-CM | POA: Diagnosis not present

## 2022-03-29 DIAGNOSIS — Z23 Encounter for immunization: Secondary | ICD-10-CM | POA: Diagnosis not present

## 2022-03-29 DIAGNOSIS — I25119 Atherosclerotic heart disease of native coronary artery with unspecified angina pectoris: Secondary | ICD-10-CM | POA: Diagnosis not present

## 2022-03-29 DIAGNOSIS — E782 Mixed hyperlipidemia: Secondary | ICD-10-CM | POA: Diagnosis not present

## 2022-03-29 DIAGNOSIS — I10 Essential (primary) hypertension: Secondary | ICD-10-CM | POA: Diagnosis not present

## 2022-03-30 ENCOUNTER — Encounter: Payer: Self-pay | Admitting: Student

## 2022-03-30 ENCOUNTER — Ambulatory Visit: Payer: BC Managed Care – PPO | Attending: Student | Admitting: Student

## 2022-03-30 VITALS — BP 114/70 | HR 70 | Wt 216.0 lb

## 2022-03-30 DIAGNOSIS — I10 Essential (primary) hypertension: Secondary | ICD-10-CM

## 2022-03-30 DIAGNOSIS — E785 Hyperlipidemia, unspecified: Secondary | ICD-10-CM | POA: Diagnosis not present

## 2022-03-30 DIAGNOSIS — I25118 Atherosclerotic heart disease of native coronary artery with other forms of angina pectoris: Secondary | ICD-10-CM | POA: Diagnosis not present

## 2022-03-30 MED ORDER — AMLODIPINE BESYLATE 5 MG PO TABS: 5.0000 mg | ORAL_TABLET | Freq: Every day | ORAL | 3 refills | Status: DC

## 2022-03-30 MED ORDER — METOPROLOL TARTRATE 50 MG PO TABS: ORAL_TABLET | ORAL | 3 refills | Status: DC

## 2022-03-30 NOTE — Patient Instructions (Signed)
Medication Instructions:  Your physician recommends that you continue on your current medications as directed. Please refer to the Current Medication list given to you today.   Labwork: None today  Testing/Procedures: None today  Follow-Up: 1 year  Any Other Special Instructions Will Be Listed Below (If Applicable).  If you need a refill on your cardiac medications before your next appointment, please call your pharmacy.  

## 2022-03-30 NOTE — Progress Notes (Signed)
Cardiology Office Note    Date:  03/30/2022   ID:  Timothy, Shaw 1956/03/25, MRN [id number]  PCP:  Celene Squibb, MD  Cardiologist: Previously Dr. Bronson Ing --> Needs to switch to new MD  Chief Complaint  Patient presents with   Follow-up    Annual Visit    History of Present Illness:    Timothy Shaw is a 66 y.o. male with past medical history of CAD (s/p cath in 2011 showing CTO of RCA and medical management pursued), HTN, HLD and history of DVT who presents to the office today for annual follow-up.  He was examined by myself in 09/2020 and reported his activity was limited secondary to recent knee replacement but had noticed episodes of chest discomfort with minimal activity and they would improve with rest. Given his new symptoms and timeframe since last ischemic evaluation, a Lexiscan Myoview was recommended for further assessment  This showed evidence of his inferior defect consistent with known RCA disease but no current ischemia and was a low-risk study. He was continued on his current medications including ASA 81 mg daily, Amlodipine 5 mg daily, Lopressor 50 mg twice daily and Pravastatin 80 mg daily.  In talking with the patient today, he reports having stable angina which has not increased in frequency or severity over the past few years. Says this has been present since 2011. He was previously intolerant to nitroglycerin and takes an extra ASA if needed. Reports his breathing has been stable and denies any specific orthopnea, PND or pitting edema. No recent palpitations. He does have arthritic pain along his legs and reports worsening discomfort along his left knee and is planning to seek repeat orthopedic evaluation.  Reports his pain is worse with activity and improves with the use of NSAIDs.   Past Medical History:  Diagnosis Date   Acute embolism and thrombosis of unspecified deep veins of left lower extremity (HCC)    Anemia    Anxiety    Anxiety disorder     Arteriosclerotic cardiovascular disease (ASCVD)    with angina: 09/2009-100% proximal RCA; 50% proximal&60% mid LAD; 30-50% mid-Cx, normal EF   Arthritis    Lyme disease in 10/2009 treated with antibiotics   CAD (coronary artery disease)    a. s/p cath in 2011 showing CTO of RCA and medical management pursued   Chronic headaches    Clotting disorder ([place])    Cutaneous abscess of right lower limb    Gastroesophageal reflux disease    Hyperlipidemia    Total cholesterol-136, triglycerides of 89, HDL 48 and LDL of 70 in 03/2010   Hypertension    Left ventricular hypertrophy   Polyosteoarthritis, unspecified    PONV (postoperative nausea and vomiting)    Tobacco abuse, in remission    20 pack years; mild airway obstruction on PFTs in 2010    Past Surgical History:  Procedure Laterality Date   APPENDECTOMY  1970   COLONOSCOPY  2008   COLONOSCOPY N/A 12/11/2018   Procedure: COLONOSCOPY;  Surgeon: Rogene Houston, MD;  [facility];  Service: Endoscopy;  Laterality: N/A;  730   HERNIA REPAIR     inguinal hernia repair     KNEE ARTHROSCOPY W/ MENISCAL REPAIR  2006, 2012   NASAL SINUS SURGERY     POLYPECTOMY  12/11/2018   Procedure: POLYPECTOMY;  Surgeon: Rogene Houston, MD;  [facility]: [facility];  Service: Endoscopy;;  ascending colon   REPLACEMENT TOTAL KNEE  Mar 17 2019    REPLACEMENT TOTAL KNEE Right    SPINE SURGERY     TONSILLECTOMY  8756   UMBILICAL HERNIA REPAIR  2003    Current Medications: Outpatient Medications Prior to Visit  Medication Sig Dispense Refill   aspirin EC 81 MG tablet Take 1 tablet (81 mg total) by mouth daily.     atorvastatin (LIPITOR) 20 MG tablet Take 20 mg by mouth daily.     esomeprazole (NEXIUM) 20 MG capsule Take 20 mg by mouth daily before breakfast. Over the counter     meloxicam (MOBIC) 15 MG tablet Take 15 mg by mouth daily.      Multiple Vitamins-Iron (MULTIVITAMIN PLUS IRON ADULT) TABS Take by mouth.     vitamin E 400 UNIT  capsule Take 400 Units by mouth daily.     amLODipine (NORVASC) 5 MG tablet TAKE 1 TABLET(5 MG) BY MOUTH DAILY 90 tablet 0   metoprolol tartrate (LOPRESSOR) 50 MG tablet Take 1 tablet by mouth twice daily. 180 tablet 0   pravastatin (PRAVACHOL) 80 MG tablet TAKE 1 TABLET(80 MG) BY MOUTH DAILY 30 tablet 0   No facility-administered medications prior to visit.     Allergies:   Patient has no known allergies.   Social History   Socioeconomic History   Marital status: Married    Spouse name: Not on file   Number of children: 1   Years of education: Not on file   Highest education level: Not on file  Occupational History   Occupation: Maintenance technician  Tobacco Use   Smoking status: Former    Packs/day: 1.00    Years: 20.00    Total pack years: 20.00    Types: Cigarettes    Start date: 06/29/1971    Quit date: 06/04/1988    Years since quitting: 33.8   Smokeless tobacco: Never  Vaping Use   Vaping Use: Never used  Substance and Sexual Activity   Alcohol use: Not Currently    Alcohol/week: 1.0 standard drink of alcohol    Types: 1 Standard drinks or equivalent per week   Drug use: No   Sexual activity: Yes  Other Topics Concern   Not on file  Social History Narrative   Not on file   Social Determinants of Health   Financial Resource Strain: Not on file  Food Insecurity: Not on file  Transportation Needs: Not on file  Physical Activity: Not on file  Stress: Not on file  Social Connections: Not on file     Family History:  The patient's family history includes Breast cancer in his sister; Colon cancer in his sister and another family member; Heart disease in his father and mother; Heart failure in an other family member; Hyperlipidemia in his father and mother; Hypertension in his father and mother; Lupus in an other family member.   Review of Systems:    Please see the history of present illness.     All other systems reviewed and are otherwise negative except as  noted above.   Physical Exam:    VS:  BP 114/70   Pulse 70   Wt 216 lb (98 kg)   SpO2 96%   BMI 27.73 kg/m    General: Pleasant male appearing in no acute distress. Head: Normocephalic, atraumatic. Neck: No carotid bruits. JVD not elevated.  Lungs: Respirations regular and unlabored, without wheezes or rales.  Heart: Regular rate and rhythm. No S3 or S4.  No murmur, no rubs, or gallops  appreciated. Abdomen: Appears non-distended. No obvious abdominal masses. Msk:  Strength and tone appear normal for age. No obvious joint deformities or effusions. Extremities: No clubbing or cyanosis. No pitting edema.  Distal pedal pulses are 2+ bilaterally. Neuro: Alert and oriented X 3. Moves all extremities spontaneously. No focal deficits noted. Psych:  Responds to questions appropriately with a normal affect. Skin: No rashes or lesions noted  Wt Readings from Last 3 Encounters:  03/30/22 216 lb (98 kg)  09/13/20 207 lb 6.4 oz (94.1 kg)  09/17/19 200 lb (90.7 kg)     Studies/Labs Reviewed:   EKG:  EKG is ordered today. The ekg ordered today demonstrates NSR, HR 69 with PAC's. No acute ST changes.   Recent Labs: No results found for requested labs within last 365 days.   Lipid Panel    Component Value Date/Time   CHOL 120 04/14/2019 0000   TRIG 49 04/14/2019 0000   TRIG 70 01/17/2010 0000   HDL 44 04/14/2019 0000   CHOLHDL 3.0 02/26/2014 0848   VLDL 19 02/26/2014 0848   LDLCALC 62 04/14/2019 0000   LDLCALC 82 01/17/2010 0000    Additional studies/ records that were reviewed today include:   NST: 09/2020 There was no ST segment deviation noted during stress. This is a low risk study. The left ventricular ejection fraction is normal (55-65%). Fixed nferior defect in the setting of significant adjacent gut radiotracer uptake and also known RCA disease/CTO. Relatively normal wall motion. LIkely combination of some inferior scar and gut artifact. There is no current  ischemia.  Assessment:    1. Coronary artery disease of native artery of native heart with stable angina pectoris ([place])   2. Essential hypertension   3. Hyperlipidemia LDL goal <70      Plan:   In order of problems listed above:  1. CAD - Prior cath in 2011 showed a CTO of the RCA and medical management was pursued. Recent NST last year showed evidence of his known RCA disease but no current ischemia and was overall a low-risk study. - He does have stable angina but denies any recent progression of symptoms. Continue current medical therapy with ASA 81 mg daily, Amlodipine 5 mg daily, Atorvastatin 20 mg daily and Lopressor 50 mg twice daily. He was previously intolerant to SL NTG secondary to headaches and prefers to take an extra ASA 81 mg if having chest discomfort.  2. HTN - His BP is well-controlled at 114/70 during today's visit. Continue current medical therapy with Amlodipine 5 mg daily and Lopressor 50 mg twice daily.  3. HLD - He brings with him today a copy of most recent labs and his LDL was previously at 100 in 09/2021 and had improved to 76 by most recent check earlier this month. Continue Atorvastatin 20 mg daily. If LDL remains above goal, could further titrate to 40 mg daily as he has overall tolerated Atorvastatin well.   Medication Adjustments/Labs and Tests Ordered: Current medicines are reviewed at length with the patient today.  Concerns regarding medicines are outlined above.  Medication changes, Labs and Tests ordered today are listed in the Patient Instructions below. Patient Instructions  Medication Instructions:  Your physician recommends that you continue on your current medications as directed. Please refer to the Current Medication list given to you today.   Labwork: None today  Testing/Procedures: None today  Follow-Up: 1 year  Any Other Special Instructions Will Be Listed Below (If Applicable).  If you need a refill  on your cardiac medications  before your next appointment, please call your pharmacy.    Signed, Erma Heritage, PA-C  03/30/2022 4:47 PM    [facility] [address] [address] [place], [place] [address] Phone: [phone] Fax: [phone]

## 2022-05-18 DIAGNOSIS — M4802 Spinal stenosis, cervical region: Secondary | ICD-10-CM | POA: Diagnosis not present

## 2022-05-18 DIAGNOSIS — Z6827 Body mass index (BMI) 27.0-27.9, adult: Secondary | ICD-10-CM | POA: Diagnosis not present

## 2022-05-18 DIAGNOSIS — M5412 Radiculopathy, cervical region: Secondary | ICD-10-CM | POA: Diagnosis not present

## 2022-05-24 DIAGNOSIS — Z96652 Presence of left artificial knee joint: Secondary | ICD-10-CM | POA: Diagnosis not present

## 2022-05-24 DIAGNOSIS — Z96653 Presence of artificial knee joint, bilateral: Secondary | ICD-10-CM | POA: Diagnosis not present

## 2022-06-28 DIAGNOSIS — M5412 Radiculopathy, cervical region: Secondary | ICD-10-CM | POA: Diagnosis not present

## 2022-09-24 DIAGNOSIS — M5416 Radiculopathy, lumbar region: Secondary | ICD-10-CM | POA: Diagnosis not present

## 2022-09-24 DIAGNOSIS — I10 Essential (primary) hypertension: Secondary | ICD-10-CM | POA: Diagnosis not present

## 2022-09-24 DIAGNOSIS — R7303 Prediabetes: Secondary | ICD-10-CM | POA: Diagnosis not present

## 2022-09-27 ENCOUNTER — Other Ambulatory Visit: Payer: Self-pay | Admitting: Orthopedic Surgery

## 2022-09-27 DIAGNOSIS — M545 Low back pain, unspecified: Secondary | ICD-10-CM

## 2022-10-01 DIAGNOSIS — M47812 Spondylosis without myelopathy or radiculopathy, cervical region: Secondary | ICD-10-CM | POA: Diagnosis not present

## 2022-10-01 DIAGNOSIS — Z6827 Body mass index (BMI) 27.0-27.9, adult: Secondary | ICD-10-CM | POA: Diagnosis not present

## 2022-10-01 DIAGNOSIS — M4802 Spinal stenosis, cervical region: Secondary | ICD-10-CM | POA: Diagnosis not present

## 2022-10-01 DIAGNOSIS — M5416 Radiculopathy, lumbar region: Secondary | ICD-10-CM | POA: Diagnosis not present

## 2022-10-02 ENCOUNTER — Encounter: Payer: Self-pay | Admitting: Internal Medicine

## 2022-10-02 DIAGNOSIS — I25119 Atherosclerotic heart disease of native coronary artery with unspecified angina pectoris: Secondary | ICD-10-CM | POA: Diagnosis not present

## 2022-10-02 DIAGNOSIS — I10 Essential (primary) hypertension: Secondary | ICD-10-CM | POA: Diagnosis not present

## 2022-10-02 DIAGNOSIS — I2510 Atherosclerotic heart disease of native coronary artery without angina pectoris: Secondary | ICD-10-CM | POA: Diagnosis not present

## 2022-10-02 DIAGNOSIS — K219 Gastro-esophageal reflux disease without esophagitis: Secondary | ICD-10-CM | POA: Diagnosis not present

## 2022-10-02 DIAGNOSIS — E782 Mixed hyperlipidemia: Secondary | ICD-10-CM | POA: Diagnosis not present

## 2022-10-03 ENCOUNTER — Other Ambulatory Visit: Payer: Self-pay | Admitting: *Deleted

## 2022-10-03 DIAGNOSIS — M7989 Other specified soft tissue disorders: Secondary | ICD-10-CM

## 2022-10-10 ENCOUNTER — Encounter: Payer: Self-pay | Admitting: Vascular Surgery

## 2022-10-10 ENCOUNTER — Ambulatory Visit (HOSPITAL_COMMUNITY)
Admission: RE | Admit: 2022-10-10 | Discharge: 2022-10-10 | Disposition: A | Discharge status: Home / Self Care | Payer: BC Managed Care – PPO | Source: Ambulatory Visit | Attending: Vascular Surgery | Admitting: Vascular Surgery

## 2022-10-10 ENCOUNTER — Other Ambulatory Visit: Payer: Self-pay

## 2022-10-10 ENCOUNTER — Ambulatory Visit: Payer: BC Managed Care – PPO | Admitting: Vascular Surgery

## 2022-10-10 VITALS — BP 133/78 | HR 56 | Temp 98.7°F | Resp 18 | Ht 73.5 in | Wt 217.4 lb

## 2022-10-10 DIAGNOSIS — M7989 Other specified soft tissue disorders: Secondary | ICD-10-CM | POA: Insufficient documentation

## 2022-10-10 NOTE — Progress Notes (Signed)
ASSESSMENT & PLAN   LEFT LEG SWELLING: This patient had formal venous reflux testing on the left and a DVT study which shows no evidence of DVT in the left lower extremity including the calf, and no evidence of superficial or deep venous reflux.  He has fairly mild swelling in the lateral aspect of his left leg.  We have discussed the importance of daily leg elevation and the proper positioning for this.  He will continue to wear a compression stocking.  He might do best with a knee-high stocking with a gradient of 15 to 20 mmHg.  He can go to stocking with a gradient of 20-30 also if this is comfortable.  I have encouraged him to avoid prolonged sitting and standing.  We discussed importance of exercise.  I will be happy to see him back at any time if any new vascular issues arise.  REASON FOR CONSULT:    Leg swelling for 2 to 3 years.  The consult is requested by Dr. Ollen Gross.   HPI:   Timothy Shaw is a 67 y.o. male who is referred with left leg swelling.  I have reviewed the records from the referring office.  The patient has had swelling in the left leg now for a couple of years.  He had back surgery in 2019 and subsequently developed a left calf DVT.  He was on Eliquis for 3 months.  Later in 2019 he developed another clot in the left calf.  He was again placed on Eliquis for several months.  He is no longer on anticoagulation.  Most recently in October of this year he underwent total knee replacement.  Prior to his knee replacement he had been noticing some swelling along the lateral aspect of his left leg over the anterior compartment.  He does not remember any specific injury to the leg.  His main complaint today is persistent swelling in this area.  He really does not have significant aching pain heaviness or tired feeling in his legs with standing.  Thus he has no real symptoms of venous hypertension.  He does wear a thigh-high compression stocking which she was prescribed in January  of this year.  This does help some.  He does not routinely elevate his legs.  I do not get any history of claudication, rest pain, or nonhealing ulcers.  Past Medical History:  Diagnosis Date   Acute embolism and thrombosis of unspecified deep veins of left lower extremity (HCC)    Anemia    Anxiety    Anxiety disorder    Arteriosclerotic cardiovascular disease (ASCVD)    with angina: 09/2009-100% proximal RCA; 50% proximal&60% mid LAD; 30-50% mid-Cx, normal EF   Arthritis    Lyme disease in 10/2009 treated with antibiotics   CAD (coronary artery disease)    a. s/p cath in 2011 showing CTO of RCA and medical management pursued   Chronic headaches    Clotting disorder (HCC)    Cutaneous abscess of right lower limb    Gastroesophageal reflux disease    Hyperlipidemia    Total cholesterol-136, triglycerides of 89, HDL 48 and LDL of 70 in 03/2010   Hypertension    Left ventricular hypertrophy   Polyosteoarthritis, unspecified    PONV (postoperative nausea and vomiting)    Tobacco abuse, in remission    20 pack years; mild airway obstruction on PFTs in 2010    Family History  Problem Relation Age of Onset   Heart disease Father  Hyperlipidemia Father    Hypertension Father    Heart disease Mother    Hyperlipidemia Mother    Hypertension Mother    Heart failure Other        conduction system disease requiring pacemaker   Lupus Other        Sibling   Colon cancer Other        Sibling   Colon cancer Sister    Breast cancer Sister    Esophageal cancer Neg Hx    Stomach cancer Neg Hx    Rectal cancer Neg Hx     SOCIAL HISTORY: Social History   Tobacco Use   Smoking status: Former    Packs/day: 1.00    Years: 20.00    Additional pack years: 0.00    Total pack years: 20.00    Types: Cigarettes    Start date: 06/29/1971    Quit date: 06/04/1988    Years since quitting: 34.3   Smokeless tobacco: Never  Substance Use Topics   Alcohol use: Not Currently     Alcohol/week: 1.0 standard drink of alcohol    Types: 1 Standard drinks or equivalent per week    No Known Allergies  Current Outpatient Medications  Medication Sig Dispense Refill   amLODipine (NORVASC) 5 MG tablet Take 1 tablet (5 mg total) by mouth daily. 90 tablet 3   aspirin EC 81 MG tablet Take 1 tablet (81 mg total) by mouth daily.     atorvastatin (LIPITOR) 20 MG tablet Take 20 mg by mouth daily.     esomeprazole (NEXIUM) 20 MG capsule Take 20 mg by mouth daily before breakfast. Over the counter     meloxicam (MOBIC) 15 MG tablet Take 15 mg by mouth daily.      metoprolol tartrate (LOPRESSOR) 50 MG tablet Take 1 tablet by mouth twice daily. 180 tablet 3   Multiple Vitamins-Iron (MULTIVITAMIN PLUS IRON ADULT) TABS Take by mouth.     vitamin E 400 UNIT capsule Take 400 Units by mouth daily.     No current facility-administered medications for this visit.    REVIEW OF SYSTEMS:  [X]  denotes positive finding, [ ]  denotes negative finding Cardiac  Comments:  Chest pain or chest pressure:    Shortness of breath upon exertion:    Short of breath when lying flat:    Irregular heart rhythm:        Vascular    Pain in calf, thigh, or hip brought on by ambulation: x   Pain in feet at night that wakes you up from your sleep:     Blood clot in your veins:    Leg swelling:  x       Pulmonary    Oxygen at home:    Productive cough:     Wheezing:         Neurologic    Sudden weakness in arms or legs:     Sudden numbness in arms or legs:     Sudden onset of difficulty speaking or slurred speech:    Temporary loss of vision in one eye:     Problems with dizziness:         Gastrointestinal    Blood in stool:     Vomited blood:         Genitourinary    Burning when urinating:     Blood in urine:        Psychiatric    Major depression:  Hematologic    Bleeding problems:    Problems with blood clotting too easily:        Skin    Rashes or ulcers:         Constitutional    Fever or chills:    -  PHYSICAL EXAM:   Vitals:   10/10/22 1402  BP: 133/78  Pulse: (!) 56  Resp: 18  Temp: 98.7 F (37.1 C)  TempSrc: Temporal  SpO2: 96%  Weight: 217 lb 6.4 oz (98.6 kg)  Height: 6' 1.5" (1.867 m)   Body mass index is 28.29 kg/m. GENERAL: The patient is a well-nourished male, in no acute distress. The vital signs are documented above. CARDIAC: There is a regular rate and rhythm.  VASCULAR: I do not detect carotid bruits. He has palpable femoral, dorsalis pedis, and posterior tibial pulses bilaterally. He has mild swelling on the lateral aspect of his left leg. PULMONARY: There is good air exchange bilaterally without wheezing or rales. ABDOMEN: Soft and non-tender with normal pitched bowel sounds.  MUSCULOSKELETAL: There are no major deformities. NEUROLOGIC: No focal weakness or paresthesias are detected. SKIN: There are no ulcers or rashes noted. PSYCHIATRIC: The patient has a normal affect.  DATA:    VENOUS DUPLEX: I have independently interpreted his venous duplex scan today.  This was of the left lower extremity only.  There was no evidence of DVT.  He had no evidence of superficial or deep venous reflux.  This was a normal study.  Waverly [facility]

## 2022-10-17 ENCOUNTER — Ambulatory Visit
Admission: RE | Admit: 2022-10-17 | Discharge: 2022-10-17 | Disposition: A | Discharge status: Home / Self Care | Payer: BC Managed Care – PPO | Source: Ambulatory Visit | Attending: Orthopedic Surgery | Admitting: Orthopedic Surgery

## 2022-10-17 DIAGNOSIS — M47816 Spondylosis without myelopathy or radiculopathy, lumbar region: Secondary | ICD-10-CM | POA: Diagnosis not present

## 2022-10-17 DIAGNOSIS — M545 Low back pain, unspecified: Secondary | ICD-10-CM | POA: Diagnosis not present

## 2022-10-17 DIAGNOSIS — M79605 Pain in left leg: Secondary | ICD-10-CM | POA: Diagnosis not present

## 2022-10-20 DIAGNOSIS — G5701 Lesion of sciatic nerve, right lower limb: Secondary | ICD-10-CM | POA: Diagnosis not present

## 2022-12-11 ENCOUNTER — Other Ambulatory Visit (HOSPITAL_COMMUNITY): Payer: Self-pay | Admitting: Internal Medicine

## 2022-12-11 DIAGNOSIS — I10 Essential (primary) hypertension: Secondary | ICD-10-CM | POA: Diagnosis not present

## 2022-12-11 DIAGNOSIS — G8929 Other chronic pain: Secondary | ICD-10-CM | POA: Diagnosis not present

## 2022-12-11 DIAGNOSIS — I8392 Asymptomatic varicose veins of left lower extremity: Secondary | ICD-10-CM | POA: Diagnosis not present

## 2022-12-11 DIAGNOSIS — R222 Localized swelling, mass and lump, trunk: Secondary | ICD-10-CM | POA: Diagnosis not present

## 2022-12-11 DIAGNOSIS — M545 Low back pain, unspecified: Secondary | ICD-10-CM | POA: Diagnosis not present

## 2022-12-11 DIAGNOSIS — M5412 Radiculopathy, cervical region: Secondary | ICD-10-CM | POA: Diagnosis not present

## 2022-12-18 DIAGNOSIS — M5416 Radiculopathy, lumbar region: Secondary | ICD-10-CM | POA: Diagnosis not present

## 2022-12-18 DIAGNOSIS — M48062 Spinal stenosis, lumbar region with neurogenic claudication: Secondary | ICD-10-CM | POA: Diagnosis not present

## 2022-12-21 ENCOUNTER — Ambulatory Visit (HOSPITAL_COMMUNITY)
Admission: RE | Admit: 2022-12-21 | Discharge: 2022-12-21 | Disposition: A | Discharge status: Home / Self Care | Payer: BC Managed Care – PPO | Source: Ambulatory Visit | Attending: Internal Medicine | Admitting: Internal Medicine

## 2022-12-21 DIAGNOSIS — R911 Solitary pulmonary nodule: Secondary | ICD-10-CM | POA: Diagnosis not present

## 2022-12-21 DIAGNOSIS — R222 Localized swelling, mass and lump, trunk: Secondary | ICD-10-CM | POA: Diagnosis not present

## 2022-12-27 DIAGNOSIS — U071 COVID-19: Secondary | ICD-10-CM | POA: Diagnosis not present

## 2023-01-02 DIAGNOSIS — G5702 Lesion of sciatic nerve, left lower limb: Secondary | ICD-10-CM | POA: Diagnosis not present

## 2023-01-25 DIAGNOSIS — M48062 Spinal stenosis, lumbar region with neurogenic claudication: Secondary | ICD-10-CM | POA: Diagnosis not present

## 2023-02-19 DIAGNOSIS — M48062 Spinal stenosis, lumbar region with neurogenic claudication: Secondary | ICD-10-CM | POA: Diagnosis not present

## 2023-03-11 DIAGNOSIS — M48062 Spinal stenosis, lumbar region with neurogenic claudication: Secondary | ICD-10-CM | POA: Diagnosis not present

## 2023-03-21 ENCOUNTER — Ambulatory Visit: Payer: BC Managed Care – PPO | Attending: Internal Medicine | Admitting: Internal Medicine

## 2023-03-21 ENCOUNTER — Encounter: Payer: Self-pay | Admitting: Internal Medicine

## 2023-03-21 VITALS — BP 136/84 | HR 56 | Ht 73.5 in | Wt 217.6 lb

## 2023-03-21 DIAGNOSIS — M79605 Pain in left leg: Secondary | ICD-10-CM | POA: Diagnosis not present

## 2023-03-21 DIAGNOSIS — I25118 Atherosclerotic heart disease of native coronary artery with other forms of angina pectoris: Secondary | ICD-10-CM

## 2023-03-21 DIAGNOSIS — M79606 Pain in leg, unspecified: Secondary | ICD-10-CM | POA: Insufficient documentation

## 2023-03-21 MED ORDER — NITROGLYCERIN 0.4 MG SL SUBL: 0.4000 mg | SUBLINGUAL_TABLET | SUBLINGUAL | 3 refills | Status: DC | PRN

## 2023-03-21 NOTE — Progress Notes (Signed)
Cardiology Office Note  Date: 03/21/2023   ID: [name], DOB 1955-11-18, MRN [id number]  PCP:  Benita Stabile, MD  Cardiologist:  Marjo Bicker, MD Electrophysiologist:  None   History of Present Illness: Timothy Shaw is a 67 y.o. male known to have CAD (RCA CTO on medical management with residual 50 to 60% mid LAD, in 2011) and normal LVEF, HTN is here for follow-up visit.  Has stable angina, gets chest pressure after walking 0.5 mile and also after mowing the lawn.  Otherwise denies having any angina.  No other symptoms of DOE, dizziness, presyncope, syncope, leg swelling.  Did not have to take sublingual nitroglycerin at all in the last many years.  Quit smoking in 1990s.  He also reported having cramps in his calves especially in the left leg along with numbness that developed recently.  This also occurs with exertion and sometimes with rest.  Past Medical History:  Diagnosis Date   Acute embolism and thrombosis of unspecified deep veins of left lower extremity (HCC)    Anemia    Anxiety    Anxiety disorder    Arteriosclerotic cardiovascular disease (ASCVD)    with angina: 09/2009-100% proximal RCA; 50% proximal&60% mid LAD; 30-50% mid-Cx, normal EF   Arthritis    Lyme disease in 10/2009 treated with antibiotics   CAD (coronary artery disease)    a. s/p cath in 2011 showing CTO of RCA and medical management pursued   Chronic headaches    Clotting disorder (HCC)    Cutaneous abscess of right lower limb    Gastroesophageal reflux disease    Hyperlipidemia    Total cholesterol-136, triglycerides of 89, HDL 48 and LDL of 70 in 03/2010   Hypertension    Left ventricular hypertrophy   Polyosteoarthritis, unspecified    PONV (postoperative nausea and vomiting)    Tobacco abuse, in remission    20 pack years; mild airway obstruction on PFTs in 2010    Past Surgical History:  Procedure Laterality Date   APPENDECTOMY  1970   COLONOSCOPY  2008   COLONOSCOPY N/A  12/11/2018   Procedure: COLONOSCOPY;  Surgeon: Malissa Hippo, MD;  [facility];  Service: Endoscopy;  Laterality: N/A;  730   HERNIA REPAIR     inguinal hernia repair     KNEE ARTHROSCOPY W/ MENISCAL REPAIR  2006, 2012   NASAL SINUS SURGERY     POLYPECTOMY  12/11/2018   Procedure: POLYPECTOMY;  Surgeon: Malissa Hippo, MD;  [facility];  Service: Endoscopy;;  ascending colon   REPLACEMENT TOTAL KNEE     Mar 17 2019    REPLACEMENT TOTAL KNEE Right    SPINE SURGERY     TONSILLECTOMY  1962   UMBILICAL HERNIA REPAIR  2003    Current Outpatient Medications  Medication Sig Dispense Refill   amLODipine (NORVASC) 5 MG tablet Take 1 tablet (5 mg total) by mouth daily. 90 tablet 3   aspirin EC 81 MG tablet Take 1 tablet (81 mg total) by mouth daily.     atorvastatin (LIPITOR) 20 MG tablet Take 20 mg by mouth daily.     esomeprazole (NEXIUM) 20 MG capsule Take 20 mg by mouth daily before breakfast. Over the counter     meloxicam (MOBIC) 15 MG tablet Take 15 mg by mouth daily.      metoprolol tartrate (LOPRESSOR) 50 MG tablet Take 1 tablet by mouth twice daily. 180 tablet 3  Multiple Vitamins-Iron (MULTIVITAMIN PLUS IRON ADULT) TABS Take by mouth.     vitamin E 400 UNIT capsule Take 400 Units by mouth daily.     No current facility-administered medications for this visit.   Allergies:  Oxycodone   Social History: The patient  reports that he quit smoking about 34 years ago. His smoking use included cigarettes. He started smoking about 51 years ago. He has a 20 pack-year smoking history. He has never used smokeless tobacco. He reports that he does not currently use alcohol after a past usage of about 1.0 standard drink of alcohol per week. He reports that he does not use drugs.   Family History: The patient's family history includes Breast cancer in his sister; Colon cancer in his sister and another family member; Heart disease in his father and mother; Heart failure in  an other family member; Hyperlipidemia in his father and mother; Hypertension in his father and mother; Lupus in an other family member.   ROS:  Please see the history of present illness. Otherwise, complete review of systems is positive for none.  All other systems are reviewed and negative.   Physical Exam: VS:  Ht 6' 1.5" (1.867 m)   Wt 217 lb 9.6 oz (98.7 kg)   BMI 28.32 kg/m , BMI Body mass index is 28.32 kg/m.  Wt Readings from Last 3 Encounters:  03/21/23 217 lb 9.6 oz (98.7 kg)  10/10/22 217 lb 6.4 oz (98.6 kg)  03/30/22 216 lb (98 kg)    General: Patient appears comfortable at rest. HEENT: Conjunctiva and lids normal, oropharynx clear with moist mucosa. Neck: Supple, no elevated JVP or carotid bruits, no thyromegaly. Lungs: Clear to auscultation, nonlabored breathing at rest. Cardiac: Regular rate and rhythm, no S3 or significant systolic murmur, no pericardial rub. Abdomen: Soft, nontender, no hepatomegaly, bowel sounds present, no guarding or rebound. Extremities: No pitting edema Skin: Warm and dry. Musculoskeletal: No kyphosis. Neuropsychiatric: Alert and oriented x3, affect grossly appropriate.  Recent Labwork: No results found for requested labs within last 365 days.     Component Value Date/Time   CHOL 120 04/14/2019 0000   TRIG 49 04/14/2019 0000   TRIG 70 01/17/2010 0000   HDL 44 04/14/2019 0000   CHOLHDL 3.0 02/26/2014 0848   VLDL 19 02/26/2014 0848   LDLCALC 62 04/14/2019 0000   LDLCALC 82 01/17/2010 0000     Assessment and Plan:  CAD in 2011 (RCA CTO with residual LAD 50 to 60% stenosis, on medical management), has stable angina: Exertional chest pressure with activities like mowing the lawn, after walking 0.5 mile.  He does not do this activities frequently.  No angina with household activities.  He will benefit from cardiac rehab but will refer to rehab after he Raechel Ache (currently works 6 days/week).  Will continue current antianginal therapy,  amlodipine 5 mg once daily and metoprolol titrate 50 mg twice daily.  Will prescribe SL NTG 0.4 mg as needed.  Continue cardioprotective medications with aspirin 81 mg once daily and atorvastatin 20 mg nightly.  HLD, unknown values: Continue atorvastatin 20 mg at bedtime, goal LDL less than 70.  He has an upcoming appointment with his PCP for annual physical, will get all his blood work done at that time.  Left lower leg pain: Obtain ABI of bilateral lower extremities.  HTN, controlled: Continue medications as stated above.   I have spent a total duration of 30 minutes reviewing the notes, labs, face-to-face discussion of his medical condition,  pathophysiology, evaluation, management, ordering labs, tests, documenting findings in the note.   Medication Adjustments/Labs and Tests Ordered: Current medicines are reviewed at length with the patient today.  Concerns regarding medicines are outlined above.    Disposition:  Follow up 6 months  Signed, Roneshia Drew Verne Spurr, MD, 03/21/2023 1:09 PM    [facility] [address], [place], [place] [address]

## 2023-03-21 NOTE — Patient Instructions (Signed)
Medication Instructions:  Your physician has recommended you make the following change in your medication:   -Start SL nitroglycerin 0.4 mg tablet as needed for chest pain. The proper use and anticipated side effects of nitroglycerine has been carefully explained.  If a single episode of chest pain is not relieved by one tablet, the patient will try another within 5 minutes; and if this doesn't relieve the pain, the patient is instructed to call 911 for transportation to an emergency department.   *If you need a refill on your cardiac medications before your next appointment, please call your pharmacy*   Lab Work: None If you have labs (blood work) drawn today and your tests are completely normal, you will receive your results only by: MyChart Message (if you have MyChart) OR A paper copy in the mail If you have any lab test that is abnormal or we need to change your treatment, we will call you to review the results.   Testing/Procedures: None   Follow-Up: At [facility], you and your health needs are our priority.  As part of our continuing mission to provide you with exceptional heart care, we have created designated Provider Care Teams.  These Care Teams include your primary Cardiologist (physician) and Advanced Practice Providers (APPs -  Physician Assistants and Nurse Practitioners) who all work together to provide you with the care you need, when you need it.  We recommend signing up for the patient portal called "MyChart".  Sign up information is provided on this After Visit Summary.  MyChart is used to connect with patients for Virtual Visits (Telemedicine).  Patients are able to view lab/test results, encounter notes, upcoming appointments, etc.  Non-urgent messages can be sent to your provider as well.   To learn more about what you can do with MyChart, go to [email or web address].    Your next appointment:   6 month(s)  Provider:   Luane School, MD     Other Instructions

## 2023-03-28 ENCOUNTER — Ambulatory Visit (HOSPITAL_COMMUNITY)
Admission: RE | Admit: 2023-03-28 | Discharge: 2023-03-28 | Disposition: A | Discharge status: Home / Self Care | Payer: BC Managed Care – PPO | Source: Ambulatory Visit | Attending: Internal Medicine | Admitting: Internal Medicine

## 2023-03-28 DIAGNOSIS — Z87891 Personal history of nicotine dependence: Secondary | ICD-10-CM | POA: Diagnosis not present

## 2023-03-28 DIAGNOSIS — M79605 Pain in left leg: Secondary | ICD-10-CM | POA: Diagnosis not present

## 2023-03-28 DIAGNOSIS — I10 Essential (primary) hypertension: Secondary | ICD-10-CM | POA: Diagnosis not present

## 2023-03-28 DIAGNOSIS — Z125 Encounter for screening for malignant neoplasm of prostate: Secondary | ICD-10-CM | POA: Diagnosis not present

## 2023-03-28 DIAGNOSIS — R7303 Prediabetes: Secondary | ICD-10-CM | POA: Diagnosis not present

## 2023-04-03 DIAGNOSIS — M5412 Radiculopathy, cervical region: Secondary | ICD-10-CM | POA: Diagnosis not present

## 2023-04-03 DIAGNOSIS — I10 Essential (primary) hypertension: Secondary | ICD-10-CM | POA: Diagnosis not present

## 2023-04-03 DIAGNOSIS — Z0000 Encounter for general adult medical examination without abnormal findings: Secondary | ICD-10-CM | POA: Diagnosis not present

## 2023-04-03 DIAGNOSIS — I8392 Asymptomatic varicose veins of left lower extremity: Secondary | ICD-10-CM | POA: Diagnosis not present

## 2023-04-03 DIAGNOSIS — M545 Low back pain, unspecified: Secondary | ICD-10-CM | POA: Diagnosis not present

## 2023-04-03 DIAGNOSIS — N400 Enlarged prostate without lower urinary tract symptoms: Secondary | ICD-10-CM | POA: Diagnosis not present

## 2023-04-22 ENCOUNTER — Other Ambulatory Visit: Payer: Self-pay | Admitting: *Deleted

## 2023-04-22 DIAGNOSIS — M48062 Spinal stenosis, lumbar region with neurogenic claudication: Secondary | ICD-10-CM | POA: Diagnosis not present

## 2023-04-22 DIAGNOSIS — R222 Localized swelling, mass and lump, trunk: Secondary | ICD-10-CM

## 2023-04-23 ENCOUNTER — Encounter: Payer: Self-pay | Admitting: Surgery

## 2023-04-23 ENCOUNTER — Encounter: Payer: Self-pay | Admitting: Neurology

## 2023-04-23 ENCOUNTER — Ambulatory Visit: Payer: BC Managed Care – PPO | Admitting: Surgery

## 2023-04-23 VITALS — BP 133/71 | HR 63 | Temp 98.4°F | Resp 12 | Ht 73.5 in | Wt 219.0 lb

## 2023-04-23 DIAGNOSIS — D171 Benign lipomatous neoplasm of skin and subcutaneous tissue of trunk: Secondary | ICD-10-CM | POA: Diagnosis not present

## 2023-04-23 NOTE — Progress Notes (Signed)
[facility] History and Physical  Reason for Referral: Chest lipoma Referring Physician: Dr. Margo Aye  Chief Complaint   New Patient (Initial Visit)     Timothy Shaw is a 67 y.o. male.  HPI: Patient presents for evaluation of a chest lipoma.  He first noticed it about 3 years ago, but since that time it has increased in size and he feels that the area has become a little bit harder.  He denies any pain at the site at baseline, but will have some soreness related to the area when he lays on top of it.  His primary care provider performed an ultrasound, and the findings were consistent with a chest lipoma.  He denies having any previous lipomas or areas excised in the past.  His past medical history significant for GERD, hypertension, hyperlipidemia, anxiety, and coronary artery disease.  He is currently using an 81 mg aspirin daily.  His surgical history is significant for an umbilical hernia repair, inguinal hernia repair, appendectomy, bilateral knee replacement, back surgery, and nasal sinus surgery.  He will socially consume alcohol, but denies use of tobacco and illicit drugs.  Past Medical History:  Diagnosis Date   Acute embolism and thrombosis of unspecified deep veins of left lower extremity (HCC)    Anemia    Anxiety    Anxiety disorder    Arteriosclerotic cardiovascular disease (ASCVD)    with angina: 09/2009-100% proximal RCA; 50% proximal&60% mid LAD; 30-50% mid-Cx, normal EF   Arthritis    Lyme disease in 10/2009 treated with antibiotics   CAD (coronary artery disease)    a. s/p cath in 2011 showing CTO of RCA and medical management pursued   Chronic headaches    Clotting disorder (HCC)    Cutaneous abscess of right lower limb    Gastroesophageal reflux disease    Hyperlipidemia    Total cholesterol-136, triglycerides of 89, HDL 48 and LDL of 70 in 03/2010   Hypertension    Left ventricular hypertrophy   Polyosteoarthritis, unspecified    PONV  (postoperative nausea and vomiting)    Tobacco abuse, in remission    20 pack years; mild airway obstruction on PFTs in 2010    Past Surgical History:  Procedure Laterality Date   APPENDECTOMY  1970   COLONOSCOPY  2008   COLONOSCOPY N/A 12/11/2018   Procedure: COLONOSCOPY;  Surgeon: Malissa Hippo, MD;  [facility];  Service: Endoscopy;  Laterality: N/A;  730   HERNIA REPAIR     inguinal hernia repair     KNEE ARTHROSCOPY W/ MENISCAL REPAIR  2006, 2012   NASAL SINUS SURGERY     POLYPECTOMY  12/11/2018   Procedure: POLYPECTOMY;  Surgeon: Malissa Hippo, MD;  Location: [facility];  Service: Endoscopy;;  ascending colon   REPLACEMENT TOTAL KNEE     Mar 17 2019    REPLACEMENT TOTAL KNEE Right    SPINE SURGERY     TONSILLECTOMY  1962   UMBILICAL HERNIA REPAIR  2003    Family History  Problem Relation Age of Onset   Heart disease Father    Hyperlipidemia Father    Hypertension Father    Heart disease Mother    Hyperlipidemia Mother    Hypertension Mother    Heart failure Other        conduction system disease requiring pacemaker   Lupus Other        Sibling   Colon cancer Other  Sibling   Colon cancer Sister    Breast cancer Sister    Esophageal cancer Neg Hx    Stomach cancer Neg Hx    Rectal cancer Neg Hx     Social History   Tobacco Use   Smoking status: Former    Current packs/day: 0.00    Average packs/day: 1 pack/day for 20.0 years (20.0 ttl pk-yrs)    Types: Cigarettes    Start date: 06/29/1971    Quit date: 06/04/1988    Years since quitting: 34.9   Smokeless tobacco: Never  Vaping Use   Vaping status: Never Used  Substance Use Topics   Alcohol use: Not Currently    Alcohol/week: 1.0 standard drink of alcohol    Types: 1 Standard drinks or equivalent per week   Drug use: No    Medications: I have reviewed the patient's current medications. Allergies as of 04/23/2023       Reactions   Oxycodone Nausea Only        Medication  List        Accurate as of April 23, 2023  3:30 PM. If you have any questions, ask your nurse or doctor.          STOP taking these medications    Turmeric 400 MG Caps Stopped by: Moua Rasmusson A Akiah Bauch       TAKE these medications    amLODipine 5 MG tablet Commonly known as: NORVASC Take 1 tablet (5 mg total) by mouth daily.   aspirin EC 81 MG tablet Take 1 tablet (81 mg total) by mouth daily.   atorvastatin 20 MG tablet Commonly known as: LIPITOR Take 20 mg by mouth daily.   esomeprazole 20 MG capsule Commonly known as: NEXIUM Take 20 mg by mouth daily before breakfast. Over the counter   meloxicam 15 MG tablet Commonly known as: MOBIC Take 15 mg by mouth daily.   metoprolol tartrate 50 MG tablet Commonly known as: LOPRESSOR Take 1 tablet by mouth twice daily.   Multivitamin Plus Iron Adult Tabs Take 1 tablet by mouth daily.   nitroGLYCERIN 0.4 MG SL tablet Commonly known as: NITROSTAT Place 1 tablet (0.4 mg total) under the tongue every 5 (five) minutes as needed for chest pain. If a single episode of chest pain is not relieved by one tablet, the patient will try another within 5 minutes; and if this doesn't relieve the pain, the patient is instructed to call 911 for transportation to an emergency department.   PreserVision AREDS 2 Caps Take 1 capsule by mouth 2 (two) times daily.   tamsulosin 0.4 MG Caps capsule Commonly known as: FLOMAX Take 0.4 mg by mouth at bedtime.   vitamin E 180 MG (400 UNITS) capsule Take 400 Units by mouth daily.         ROS:  Constitutional: negative for chills, fatigue, and fevers Eyes: negative for visual disturbance and pain Ears, nose, mouth, throat, and face: negative for ear drainage, sore throat, and sinus problems Respiratory: negative for cough, wheezing, and shortness of breath Cardiovascular: negative for chest pain and palpitations Gastrointestinal: positive for reflux symptoms, negative for abdominal  pain, nausea, and vomiting Genitourinary:positive for frequency, negative for dysuria Integument/breast: negative for dryness and rash Hematologic/lymphatic: negative for bleeding and lymphadenopathy Musculoskeletal:positive for back pain and neck pain Neurological: negative for dizziness and tremors Endocrine: negative for temperature intolerance  Blood pressure 133/71, pulse 63, temperature 98.4 F (36.9 C), temperature source Oral, resp. rate 12, height 6' 1.5" (1.867  m), weight 219 lb (99.3 kg), SpO2 93%. Physical Exam Vitals reviewed.  Constitutional:      Appearance: Normal appearance.  HENT:     Head: Normocephalic and atraumatic.  Eyes:     Extraocular Movements: Extraocular movements intact.     Pupils: Pupils are equal, round, and reactive to light.  Cardiovascular:     Rate and Rhythm: Normal rate and regular rhythm.  Pulmonary:     Effort: Pulmonary effort is normal.     Breath sounds: Normal breath sounds.  Chest:     Comments: 5 cm midline soft tissue mass in upper chest, soft and mobile Abdominal:     General: There is no distension.     Palpations: Abdomen is soft.     Tenderness: There is no abdominal tenderness.  Musculoskeletal:        General: Normal range of motion.     Cervical back: Normal range of motion.  Skin:    General: Skin is warm and dry.  Neurological:     General: No focal deficit present.     Mental Status: He is alert and oriented to person, place, and time.  Psychiatric:        Mood and Affect: Mood normal.        Behavior: Behavior normal.     Results: Ultrasound of the chest soft tissue (12/21/2022):  IMPRESSION: Palpable complaint is a 5 x 2 cm subcutaneous mass with features suggesting lipoma.   Assessment & Plan:  Timothy Shaw is a 67 y.o. male who presents for evaluation of a chest lipoma.  -Explained the pathophysiology of lipomas to the patient.  We discussed that this is likely benign, but since it has been  increasing in size and he has some intermittent soreness related to the area, it is appropriate to proceed with excision of the mass. -The risk and benefits of chest lipoma excision were discussed including but not limited to bleeding, infection, injury to surrounding structures, lipoma recurrence, and need for additional procedures.  After careful consideration, Timothy Shaw has decided to proceed with excision.  -Patient scheduled for in office excision of the likely lipoma on 12/5 -Information provided to the patient regarding lipomas and lipoma removal  All questions were answered to the satisfaction of the patient.  Theophilus Kinds, DO [facility] [address] [place], [place] [address] [phone] (office)

## 2023-04-24 ENCOUNTER — Other Ambulatory Visit: Payer: Self-pay

## 2023-04-24 DIAGNOSIS — R202 Paresthesia of skin: Secondary | ICD-10-CM

## 2023-05-09 ENCOUNTER — Ambulatory Visit (INDEPENDENT_AMBULATORY_CARE_PROVIDER_SITE_OTHER): Payer: BC Managed Care – PPO | Admitting: Surgery

## 2023-05-09 ENCOUNTER — Encounter: Payer: Self-pay | Admitting: Surgery

## 2023-05-09 VITALS — BP 123/74 | HR 63 | Temp 98.4°F | Resp 14 | Ht 73.5 in | Wt 219.0 lb

## 2023-05-09 DIAGNOSIS — D171 Benign lipomatous neoplasm of skin and subcutaneous tissue of trunk: Secondary | ICD-10-CM | POA: Diagnosis not present

## 2023-05-09 DIAGNOSIS — R222 Localized swelling, mass and lump, trunk: Secondary | ICD-10-CM | POA: Diagnosis not present

## 2023-05-09 NOTE — Progress Notes (Signed)
[facility] Note   HPI:  67 y.o. Male presents to clinic for in office excision of chest wall lipoma.  Patient denies any complaints since his last visit.  He is ready to proceed with excision of the lipoma.  Review of Systems:  All other review of systems: otherwise negative   Vital Signs:  BP 123/74   Pulse 63   Temp 98.4 F (36.9 C) (Oral)   Resp 14   Ht 6' 1.5" (1.867 m)   Wt 219 lb (99.3 kg)   SpO2 93%   BMI 28.50 kg/m    Physical Exam:  Physical Exam Vitals reviewed.  Constitutional:      Appearance: Normal appearance.  Chest:     Comments: 5 cm chest wall lipoma, nontender to palpation Neurological:     Mental Status: He is alert.     Laboratory studies: None  Imaging:  None  Assessment:  67 y.o. yo Male who presents for an office excision of a chest wall lipoma.  Plan:  -The risks and benefits of chest lipoma excision were discussed with the patient, including but not limited to bleeding, infection, injury to surrounding structures, lipoma recurrence, need for additional procedures.  After careful consideration, Timothy Shaw has decided to proceed with excision.  Written consent was obtained -Please refer to procedure note below for details of the procedure -Advised that he has dissolvable stitches under the skin and Dermabond at his incision site.  The Dermabond will come off in the next 10 to 14 days. -Given the significant size of the lipoma, he will likely develop a seroma at this incision site.  If the seroma is not bothering him, we can monitor the area -Advised him to call our office if he begins to have worsening pain, redness, or fevers/chills -Follow up with me in 2 weeks  All of the above recommendations were discussed with the patient, and all of patient's questions were answered to his expressed satisfaction.   Bedside procedure note   Preoperative Diagnosis: Chest wall lipoma Postoperative Diagnosis: Chest wall lipoma    Procedure(s) Performed: Excision of chest wall lipoma   Performing provider: Santina Evans Bernell Sigal, DO   Estimated Blood Loss: Minimal   Findings: 6 cm chest wall lipoma   Procedure: At bedside, chest was prepped with Betadine and draped with OR towels.  Verbal and written consent for were obtained from the patient prior to beginning of procedure.  Timeout was performed.  Area was localized with lidocaine with 1% epinephrine.  A linear incision was made over top of the lipoma.  Using a combination of blunt and sharp dissection, the lipoma was dissected from the surrounding tissues.  It was removed and its entirety and sent to pathology for evaluation.  Hemostasis was noted.  The cavity was irrigated.  The dermis was closed with 3-0 Vicryl in an interrupted fashion, and the skin was closed with 4-0 Monocryl in a subcuticular fashion.  The incision was dressed with Dermabond.  A pressure dressing was placed with a nonadherent gauze and Tegaderm.  Patient tolerated the procedure without issue.   Theophilus Kinds, DO [facility] [address] [place], [place] [address] [phone] (office)

## 2023-05-09 NOTE — Patient Instructions (Signed)
Surgery Discharge Instructions  Activity  Resume light activity. No heavy lifting over 10 lbs or strenuous exercise.  Fluids and Diet Regular diet  Medications  If you have not had a bowel movement in 24 hours, take 2 tablespoons over the counter Milk of mag.             You May resume your blood thinners on Saturday, 12/7 (Aspirin).   Incision Site  You have a liquid bandage over your incisions, this will begin to flake off in about a week. Ok to English as a second language teacher. Keep wound clean and dry. No baths or swimming. No lifting more than 10 pounds.  Contact Information: If you have questions or concerns, please call our office, [phone], Monday- Thursday 8AM-5PM and Friday 8AM-12Noon.  If it is after hours or on the weekend, please call [facility] Main Number, [phone], and ask to speak to the surgeon on call for Dr. Robyne Peers at [facility].   SPECIFIC COMPLICATIONS TO WATCH FOR: Inability to urinate Fever over 101? F by mouth Nausea and vomiting lasting longer than 24 hours. Pain not relieved by medication ordered Swelling around the operative site Increased redness, warmth, hardness, around operative area Numbness, tingling, or cold fingers or toes Blood -soaked dressing, (small amounts of oozing may be normal) Increasing and progressive drainage from surgical area or exam site

## 2023-05-15 LAB — PATHOLOGY REPORT

## 2023-05-15 LAB — TISSUE SPECIMEN

## 2023-05-21 ENCOUNTER — Ambulatory Visit: Payer: BC Managed Care – PPO | Admitting: Neurology

## 2023-05-21 DIAGNOSIS — M5417 Radiculopathy, lumbosacral region: Secondary | ICD-10-CM

## 2023-05-21 DIAGNOSIS — R202 Paresthesia of skin: Secondary | ICD-10-CM

## 2023-05-21 NOTE — Procedures (Signed)
  [facility]  [address] [place], [address]  [place], [place] [address] Tel: [phone] Fax: [phone] Test Date:  05/21/2023  Patient: Timothy Shaw DOB: [birth year] Physician: Jacquelyne Balint, MD  Sex: Male Height: 5' 1.5" Ref Phys: Claria Dice, MD  ID#: [id number]   Technician:    History: This is a 67 year old male with left leg pain.  NCV & EMG Findings: Extensive electrodiagnostic evaluation of the left lower limb shows: Left sural and superficial peroneal/fibular sensory responses are within normal limits. Left peroneal/fibular (EDB) motor response shows reduced amplitude (1.65 mV). Left tibial (AH) and peroneal/fibular (TA) motor responses are within normal limits. Left H reflex latency is within normal limits. Chronic motor axon loss changes without accompanying active denervation changes are seen in the left tibialis anterior, flexor digitorum longus, and gluteus medius muscles.  Impression: This is an abnormal study. The findings are most consistent with the following: The residuals of an old intraspinal canal lesion (ie: motor radiculopathy) at the left L5 root or segment, mild in degree electrically. No electrodiagnostic evidence of a large fiber sensorimotor neuropathy. Screening studies for a left peroneal/fibular, tibial, or sciatic neuropathy are normal.    ___________________________ Jacquelyne Balint, MD    Nerve Conduction Studies Motor Nerve Results    Latency Amplitude F-Lat Segment Distance CV Comment  Site (ms) Norm (mV) Norm (ms)  (cm) (m/s) Norm   Left Fibular (EDB) Motor  Ankle 3.5  < 6.0 *1.65  > 2.5        Bel fib head 11.7 - 1.33 -  Bel fib head-Ankle 34 41  > 40   Pop fossa 14.1 - 1.27 -  Pop fossa-Bel fib head 10 42 -   Left Fibular (TA) Motor  Fib head 2.3  < 4.5 6.1  > 3.0        Pop fossa 3.8  < 6.7 5.5 -  Pop fossa-Fib head 10 67  > 40   Left Tibial (AH) Motor  Ankle 3.6  < 6.0 10.3  > 4.0        Knee 14.3 - 8.8 -  Knee-Ankle 47 44  >  40    Sensory Sites    Neg Peak Lat Amplitude (O-P) Segment Distance Velocity Comment  Site (ms) Norm (V) Norm  (cm) (ms)   Left Superficial Fibular Sensory  14 cm-Ankle 3.3  < 4.6 5  > 3 14 cm-Ankle 14    Left Sural Sensory  Calf-Lat mall 4.2  < 4.6 4  > 3 Calf-Lat mall 14     H-Reflex Results    M-Lat H Lat H Neg Amp H-M Lat  Site (ms) (ms) Norm (mV) (ms)  Left Tibial H-Reflex  Pop fossa 6.8 34.7  < 35.0 4.9 27.9   Electromyography   Side Muscle Ins.Act Fibs Fasc Recrt Amp Dur Poly Activation Comment  Left Tib ant Nml Nml Nml *1- *1+ *1+ Nml Nml N/A  Left Gastroc MH Nml Nml Nml Nml Nml Nml Nml Nml N/A  Left FDL Nml Nml Nml *2- *1+ *1+ Nml Nml N/A  Left Rectus fem Nml Nml Nml Nml Nml Nml Nml Nml N/A  Left Biceps fem SH Nml Nml Nml Nml Nml Nml Nml Nml N/A  Left Gluteus med Nml Nml Nml *1- *1+ *1+ Nml Nml N/A      Waveforms:  Motor        Sensory      H-Reflex

## 2023-05-22 ENCOUNTER — Ambulatory Visit (INDEPENDENT_AMBULATORY_CARE_PROVIDER_SITE_OTHER): Payer: BC Managed Care – PPO | Admitting: Surgery

## 2023-05-22 VITALS — BP 123/74 | HR 67 | Temp 97.6°F | Resp 14 | Ht 73.5 in | Wt 220.0 lb

## 2023-05-22 DIAGNOSIS — Z09 Encounter for follow-up examination after completed treatment for conditions other than malignant neoplasm: Secondary | ICD-10-CM

## 2023-05-22 NOTE — Progress Notes (Unsigned)
[facility] Note   HPI:  67 y.o. Male presents to clinic for follow-up status post excision of anterior chest lipoma in office on 12/5.  Patient has overall been doing well since the procedure.  He denies any significant pain.  He denies any problems with his incision site.  Review of Systems:  All other review of systems: otherwise negative   Vital Signs:  BP 123/74   Pulse 67   Temp 97.6 F (36.4 C) (Oral)   Resp 14   Ht 6' 1.5" (1.867 m)   Wt 220 lb (99.8 kg)   SpO2 98%   BMI 28.63 kg/m    Physical Exam:  Physical Exam Vitals reviewed.  Constitutional:      Appearance: Normal appearance.  Skin:    General: Skin is warm and dry.     Comments: Anterior chest incision site healing well with improving ecchymosis, knot of Monocryl poking through skin at inferior aspect of incision  Neurological:     Mental Status: He is alert.     Laboratory studies: None  Imaging:  None  Pathology:  A Diagnosis  [facility] PATHOLOG[facility]  Comment: Lipoma . MICROSCOPIC DESCRIPTION: There are large lobules of fat unassociated with fibrous trabeculae.    Assessment:  67 y.o. yo Male who presents for follow-up status post chest lipoma excision in office on 12/5  Plan:  -Patient doing well since the procedure- pain well-controlled -We discussed the pathology of the mass -Knot of Monocryl was removed with scissors -Follow up with me as needed  All of the above recommendations were discussed with the patient, and all of patient's questions were answered to his expressed satisfaction.  Theophilus Kinds, DO [facility] [address] [place], [place] [address] [phone] (office)

## 2023-06-19 ENCOUNTER — Other Ambulatory Visit: Payer: Self-pay | Admitting: Student

## 2023-06-19 DIAGNOSIS — I25118 Atherosclerotic heart disease of native coronary artery with other forms of angina pectoris: Secondary | ICD-10-CM

## 2023-06-19 DIAGNOSIS — I10 Essential (primary) hypertension: Secondary | ICD-10-CM

## 2023-10-02 ENCOUNTER — Encounter: Payer: Self-pay | Admitting: Internal Medicine

## 2023-10-08 ENCOUNTER — Inpatient Hospital Stay: Attending: Medical Genetics

## 2023-10-08 ENCOUNTER — Inpatient Hospital Stay: Admitting: Genetic Counselor

## 2023-10-08 ENCOUNTER — Encounter: Payer: Self-pay | Admitting: Genetic Counselor

## 2023-10-08 ENCOUNTER — Encounter: Admitting: Genetic Counselor

## 2023-10-08 ENCOUNTER — Other Ambulatory Visit: Payer: Self-pay | Admitting: Genetic Counselor

## 2023-10-08 DIAGNOSIS — Z1379 Encounter for other screening for genetic and chromosomal anomalies: Secondary | ICD-10-CM

## 2023-10-08 DIAGNOSIS — Z800 Family history of malignant neoplasm of digestive organs: Secondary | ICD-10-CM | POA: Diagnosis not present

## 2023-10-08 DIAGNOSIS — Z803 Family history of malignant neoplasm of breast: Secondary | ICD-10-CM

## 2023-10-08 LAB — GENETIC SCREENING ORDER

## 2023-10-08 NOTE — Progress Notes (Signed)
 REFERRING PROVIDER: Self Referral  PRIMARY PROVIDER:  Omie Bickers, MD  PRIMARY REASON FOR VISIT:  1. Family hx of colon cancer   2. Family history of breast cancer    HISTORY OF PRESENT ILLNESS:   Timothy Shaw, a 68 y.o. male, was seen for a [facility] cancer genetics consultation due to a family history of cancer.  Timothy Shaw presents to clinic today to discuss the possibility of a hereditary predisposition to cancer, to discuss genetic testing, and to further clarify his future cancer risks, as well as potential cancer risks for family members.   Timothy Shaw is a 68 y.o. male with no personal history of cancer.    Past Medical History:  Diagnosis Date   Acute embolism and thrombosis of unspecified deep veins of left lower extremity (HCC)    Anemia    Anxiety    Anxiety disorder    Arteriosclerotic cardiovascular disease (ASCVD)    with angina: 09/2009-100% proximal RCA; 50% proximal&60% mid LAD; 30-50% mid-Cx, normal EF   Arthritis    Lyme disease in 10/2009 treated with antibiotics   CAD (coronary artery disease)    a. s/p cath in 2011 showing CTO of RCA and medical management pursued   Chronic headaches    Clotting disorder (HCC)    Cutaneous abscess of right lower limb    Gastroesophageal reflux disease    Hyperlipidemia    Total cholesterol-136, triglycerides of 89, HDL 48 and LDL of 70 in 03/2010   Hypertension    Left ventricular hypertrophy   Polyosteoarthritis, unspecified    PONV (postoperative nausea and vomiting)    Tobacco abuse, in remission    20 pack years; mild airway obstruction on PFTs in 2010    Past Surgical History:  Procedure Laterality Date   APPENDECTOMY  1970   COLONOSCOPY  2008   COLONOSCOPY N/A 12/11/2018   Procedure: COLONOSCOPY;  Surgeon: Ruby Corporal, MD;  [facility];  Service: Endoscopy;  Laterality: N/A;  730   HERNIA REPAIR     inguinal hernia repair     KNEE ARTHROSCOPY W/ MENISCAL REPAIR  2006, 2012   NASAL SINUS SURGERY      POLYPECTOMY  12/11/2018   Procedure: POLYPECTOMY;  Surgeon: Ruby Corporal, MD;  [facility];  Service: Endoscopy;;  ascending colon   REPLACEMENT TOTAL KNEE     Mar 17 2019    REPLACEMENT TOTAL KNEE Right    SPINE SURGERY     TONSILLECTOMY  1962   UMBILICAL HERNIA REPAIR  2003    Social History   Socioeconomic History   Marital status: Married    Spouse name: Not on file   Number of children: 1   Years of education: Not on file   Highest education level: Not on file  Occupational History   Occupation: Maintenance technician  Tobacco Use   Smoking status: Former    Current packs/day: 0.00    Average packs/day: 1 pack/day for 20.0 years (20.0 ttl pk-yrs)    Types: Cigarettes    Start date: 06/29/1971    Quit date: 06/04/1988    Years since quitting: 35.3   Smokeless tobacco: Never  Vaping Use   Vaping status: Never Used  Substance and Sexual Activity   Alcohol use: Not Currently    Alcohol/week: 1.0 standard drink of alcohol    Types: 1 Standard drinks or equivalent per week   Drug use: No   Sexual activity: Yes  Other  Topics Concern   Not on file  Social History Narrative   Not on file   Social Drivers of Health   Financial Resource Strain: Not on file  Food Insecurity: Not on file  Transportation Needs: Not on file  Physical Activity: Not on file  Stress: Not on file  Social Connections: Not on file     FAMILY HISTORY:  We obtained a detailed, 4-generation family history.  Significant diagnoses are listed below: Family History  Problem Relation Age of Onset   Colon cancer Sister 65   Breast cancer Sister 55       dx. again at age 64, reports negative genetics     Timothy Shaw is unaware of previous family history of genetic testing for hereditary cancer risks. There is no reported Ashkenazi Jewish ancestry.   GENETIC COUNSELING ASSESSMENT: Timothy Shaw is a 68 y.o. male with a family history of cancer which is somewhat suggestive of a hereditary  predisposition to cancer. We, therefore, discussed and recommended the following at today's visit.   DISCUSSION: We discussed that 5 - 10% of cancer is hereditary, with most cases of hereditary breast cancer associated with BRCA1/2 and hereditary colon cancer associated with Lynch Syndrome.  There are other genes that can be associated with hereditary breast and colon cancer syndromes.  We discussed that testing is beneficial for several reasons, including knowing about other cancer risks, identifying potential screening and risk-reduction options that may be appropriate, and to understanding if other family members could be at risk for cancer and allowing them to undergo genetic testing.  We reviewed the characteristics, features and inheritance patterns of hereditary cancer syndromes. We also discussed genetic testing, including the appropriate family members to test, the process of testing, insurance coverage and turn-around-time for results. We discussed the implications of a negative, positive, carrier and/or variant of uncertain significant result. We discussed that negative results would be uninformative given that Timothy Shaw does not have a personal history of cancer. We recommended Timothy Shaw pursue genetic testing for a panel that contains genes associated with breast and colon cancer.  Timothy Shaw was offered a common hereditary cancer panel (39 genes) and an expanded pan-cancer panel (77 genes). Timothy Shaw was informed of the benefits and limitations of each panel, including that expanded pan-cancer panels contain several genes that do not have clear management guidelines at this point in time.  We also discussed that as the number of genes included on a panel increases, the chances of variants of uncertain significance increases.  After considering the benefits and limitations of each gene panel, Timothy Shaw elected to have [facility] CancerNext-Expanded Panel.   The CancerNext-Expanded gene panel offered  by [facility] and includes sequencing, rearrangement, and RNA analysis for the following 77 genes: AIP, ALK, APC, ATM, AXIN2, BAP1, BARD1, BMPR1A, BRCA1, BRCA2, BRIP1, CDC73, CDH1, CDK4, CDKN1B, CDKN2A, CEBPA, CHEK2, CTNNA1, DDX41, DICER1, ETV6, FH, FLCN, GATA2, LZTR1, MAX, MBD4, MEN1, MET, MLH1, MSH2, MSH3, MSH6, MUTYH, NF1, NF2, NTHL1, PALB2, PHOX2B, PMS2, POT1, PRKAR1A, PTCH1, PTEN, RAD51C, RAD51D, RB1, RET, RPS20, RUNX1, SDHA, SDHAF2, SDHB, SDHC, SDHD, SMAD4, SMARCA4, SMARCB1, SMARCE1, STK11, SUFU, TMEM127, TP53, TSC1, TSC2, VHL, and WT1 (sequencing and deletion/duplication); EGFR, HOXB13, KIT, MITF, PDGFRA, POLD1, and POLE (sequencing only); EPCAM and GREM1 (deletion/duplication only).   Based on Timothy Shaw's family history of cancer, he meets medical criteria for genetic testing. Though Timothy Shaw is not personally affected, his sister diagnosed with colon cancer at age 36 has passed. Despite that  he meets criteria, he may still have an out of pocket cost. We discussed that if his out of pocket cost for testing is over $100, the laboratory should contact them to discuss self-pay prices, patient pay assistance programs, if applicable, and other billing options.  We discussed that some people do not want to undergo genetic testing due to fear of genetic discrimination.  A federal law called the Genetic Information Non-Discrimination Act (GINA) of 2008 helps protect individuals against genetic discrimination based on their genetic test results.  It impacts both health insurance and employment.  With health insurance, it protects against increased premiums, being kicked off insurance or being forced to take a test in order to be insured.  For employment it protects against hiring, firing and promoting decisions based on genetic test results.  GINA does not apply to those in the [employer], those who work for companies with less than 15 employees, and new life insurance or long-term disability insurance  policies.  Health status due to a cancer diagnosis is not protected under GINA.  PLAN: After considering the risks, benefits, and limitations, Timothy Shaw provided informed consent to pursue genetic testing and the blood sample was sent to [facility] for analysis of the CancerNext-Expanded Panel. Results should be available within approximately 2-3 weeks' time, at which point they will be disclosed by telephone to Timothy Shaw, as will any additional recommendations warranted by these results. Timothy Shaw will receive a summary of his genetic counseling visit and a copy of his results once available. This information will also be available in Epic.   Mr. Becken questions were answered to his satisfaction today. Our contact information was provided should additional questions or concerns arise. Thank you for the referral and allowing us  to share in the care of your patient.   Anastasha Ortez, MS, [facility] Genetic Counselor [email or web address] (P) [phone]  40 minutes were spent on the date of the encounter in service to the patient including preparation, face-to-face consultation, documentation and care coordination.  The patient brought his wife. Drs. Gudena and/or Maryalice Smaller were available to discuss this case as needed.  _______________________________________________________________________ For Office Staff:  Number of people involved in session: 2 Was an Intern/ student involved with case: no

## 2023-10-22 ENCOUNTER — Telehealth: Payer: Self-pay | Admitting: Genetic Counselor

## 2023-10-22 ENCOUNTER — Encounter: Payer: Self-pay | Admitting: Genetic Counselor

## 2023-10-22 DIAGNOSIS — Z1379 Encounter for other screening for genetic and chromosomal anomalies: Secondary | ICD-10-CM | POA: Insufficient documentation

## 2023-10-22 NOTE — Telephone Encounter (Signed)
 I contacted Timothy Shaw to discuss his genetic testing results. No pathogenic variants were identified in the 77 genes analyzed. Detailed clinic note to follow.  The test report has been scanned into EPIC and is located under the Molecular Pathology section of the Results Review tab.  A portion of the result report is included below for reference.   Timothy Hsiao, MS, [facility] Genetic Counselor [email or web address] (P) [phone]

## 2023-10-25 ENCOUNTER — Ambulatory Visit: Payer: Self-pay | Admitting: Genetic Counselor

## 2023-10-25 ENCOUNTER — Encounter: Payer: Self-pay | Admitting: Genetic Counselor

## 2023-10-25 DIAGNOSIS — Z1379 Encounter for other screening for genetic and chromosomal anomalies: Secondary | ICD-10-CM

## 2023-10-25 NOTE — Progress Notes (Signed)
 HPI:   Timothy Shaw was previously seen in the [facility] due to a family history of cancer and concerns regarding a hereditary predisposition to cancer. Please refer to our prior cancer genetics clinic note for more information regarding our discussion, assessment and recommendations, at the time. Timothy Shaw recent genetic test results were disclosed to him, as were recommendations warranted by these results. These results and recommendations are discussed in more detail below.  CANCER HISTORY:  Oncology History   No history exists.    FAMILY HISTORY:  We obtained a detailed, 4-generation family history.  Significant diagnoses are listed below:      Family History  Problem Relation Age of Onset   Colon cancer Sister 51   Breast cancer Sister 37        dx. again at age 6, reports negative genetics       Timothy Shaw is unaware of previous family history of genetic testing for hereditary cancer risks. There is no reported Ashkenazi Jewish ancestry.    GENETIC TEST RESULTS:  The [facility] CancerNext-Expanded Panel found no pathogenic mutations.  The CancerNext-Expanded gene panel offered by [facility] and includes sequencing, rearrangement, and RNA analysis for the following 77 genes: AIP, ALK, APC, ATM, AXIN2, BAP1, BARD1, BMPR1A, BRCA1, BRCA2, BRIP1, CDC73, CDH1, CDK4, CDKN1B, CDKN2A, CEBPA, CHEK2, CTNNA1, DDX41, DICER1, ETV6, FH, FLCN, GATA2, LZTR1, MAX, MBD4, MEN1, MET, MLH1, MSH2, MSH3, MSH6, MUTYH, NF1, NF2, NTHL1, PALB2, PHOX2B, PMS2, POT1, PRKAR1A, PTCH1, PTEN, RAD51C, RAD51D, RB1, RET, RPS20, RUNX1, SDHA, SDHAF2, SDHB, SDHC, SDHD, SMAD4, SMARCA4, SMARCB1, SMARCE1, STK11, SUFU, TMEM127, TP53, TSC1, TSC2, VHL, and WT1 (sequencing and deletion/duplication); EGFR, HOXB13, KIT, MITF, PDGFRA, POLD1, and POLE (sequencing only); EPCAM and GREM1 (deletion/duplication only).    The test report has been scanned into EPIC and is located under the Molecular Pathology section  of the Results Review tab.  A portion of the result report is included below for reference. Genetic testing reported out on 10/21/2023.       Even though a pathogenic variant was not identified, possible explanations for the cancer in the family may include: There may be no hereditary risk for cancer in the family. The cancers in his family may be due to other genetic or environmental factors. There may be a gene mutation in one of these genes that current testing methods cannot detect, but that chance is small. There could be another gene that has not yet been discovered, or that we have not yet tested, that is responsible for the cancer diagnoses in the family.  It is also possible there is a hereditary cause for the cancer in the family that Timothy Shaw did not inherit.  Therefore, it is important to remain in touch with cancer genetics in the future so that we can continue to offer Timothy Shaw the most up to date genetic testing.  ADDITIONAL GENETIC TESTING:  We discussed with Timothy Shaw that his genetic testing was fairly extensive.  If there are genes identified to increase cancer risk that can be analyzed in the future, we would be happy to discuss and coordinate this testing at that time.    CANCER SCREENING RECOMMENDATIONS:  Timothy Shaw test result is considered negative (normal).  This means that we have not identified a hereditary cause for his family history of cancer at this time.   An individual's cancer risk and medical management are not determined by genetic test results alone. Overall cancer risk assessment incorporates additional factors,  including personal medical history, family history, and any available genetic information that may result in a personalized plan for cancer prevention and surveillance. Therefore, it is recommended he continue to follow the cancer management and screening guidelines provided by his primary healthcare provider.  Based on the reported personal and  family history, specific cancer screenings for Timothy Shaw and his family include:  Colon Cancer Screening: Due to Timothy Shaw's sister's history of colon cancer, he is recommended to repeat colonoscopies every 5 years. More frequent colonoscopies may be recommended if polyps are identified.  RECOMMENDATIONS FOR FAMILY MEMBERS:   Since he did not inherit a mutation in a cancer predisposition gene included on this panel, his daughter could not have inherited a mutation from him in one of these genes. Other members of the family may still carry a pathogenic variant in one of these genes that Timothy Shaw did not inherit. Based on the family history, we recommend his siblings and parents have genetic counseling and testing.   FOLLOW-UP:  Cancer genetics is a rapidly advancing field and it is possible that new genetic tests will be appropriate for him and/or his family members in the future. We encouraged him to remain in contact with cancer genetics on an annual basis so we can update his personal and family histories and let him know of advances in cancer genetics that may benefit this family.   Our contact number was provided. Timothy Shaw questions were answered to his satisfaction, and he knows he is welcome to call us  at anytime with additional questions or concerns.   Maxey Ransom, MS, [facility] Genetic Counselor [email or web address] (P) [phone]

## 2023-11-11 ENCOUNTER — Encounter (INDEPENDENT_AMBULATORY_CARE_PROVIDER_SITE_OTHER): Payer: Self-pay | Admitting: *Deleted

## 2023-12-10 ENCOUNTER — Encounter: Payer: Self-pay | Admitting: Internal Medicine

## 2023-12-10 ENCOUNTER — Ambulatory Visit: Attending: Internal Medicine | Admitting: Internal Medicine

## 2023-12-10 VITALS — BP 138/68 | HR 58 | Ht 73.5 in

## 2023-12-10 DIAGNOSIS — I25118 Atherosclerotic heart disease of native coronary artery with other forms of angina pectoris: Secondary | ICD-10-CM | POA: Diagnosis not present

## 2023-12-10 DIAGNOSIS — E7849 Other hyperlipidemia: Secondary | ICD-10-CM | POA: Diagnosis not present

## 2023-12-10 DIAGNOSIS — I10 Essential (primary) hypertension: Secondary | ICD-10-CM | POA: Diagnosis not present

## 2023-12-10 NOTE — Progress Notes (Unsigned)
 Cardiology Office Note  Date: 12/10/2023   ID: Tobin, Witucki [birth year], MRN [id number]  PCP:  Shona Norleen PEDLAR, MD  Cardiologist:  Diannah SHAUNNA Maywood, MD Electrophysiologist:  None   History of Present Illness: Timothy Shaw is a 68 y.o. male known to have CAD (RCA CTO on medical management with residual 50 to 60% mid LAD, in 2011) and normal LVEF, HTN is here for follow-up visit.  Has stable angina, gets chest pressure after walking 0.5 mile and also after mowing the lawn.  Otherwise denies having any angina.  No other symptoms of DOE, dizziness, presyncope, syncope, leg swelling.  Did not have to take sublingual nitroglycerin  at all in the last many years.  Quit smoking in 1990s.  He also reported having cramps in his calves especially in the left leg along with numbness that developed recently.  This also occurs with exertion and sometimes with rest.  Past Medical History:  Diagnosis Date   Acute embolism and thrombosis of unspecified deep veins of left lower extremity (HCC)    Anemia    Anxiety    Anxiety disorder    Arteriosclerotic cardiovascular disease (ASCVD)    with angina: 09/2009-100% proximal RCA; 50% proximal&60% mid LAD; 30-50% mid-Cx, normal EF   Arthritis    Lyme disease in 10/2009 treated with antibiotics   CAD (coronary artery disease)    a. s/p cath in 2011 showing CTO of RCA and medical management pursued   Chronic headaches    Clotting disorder (HCC)    Cutaneous abscess of right lower limb    Gastroesophageal reflux disease    Hyperlipidemia    Total cholesterol-136, triglycerides of 89, HDL 48 and LDL of 70 in 03/2010   Hypertension    Left ventricular hypertrophy   Polyosteoarthritis, unspecified    PONV (postoperative nausea and vomiting)    Tobacco abuse, in remission    20 pack years; mild airway obstruction on PFTs in 2010    Past Surgical History:  Procedure Laterality Date   APPENDECTOMY  1970   COLONOSCOPY  2008   COLONOSCOPY N/A  12/11/2018   Procedure: COLONOSCOPY;  Surgeon: Golda Claudis PENNER, MD;  [facility];  Service: Endoscopy;  Laterality: N/A;  730   HERNIA REPAIR     inguinal hernia repair     KNEE ARTHROSCOPY W/ MENISCAL REPAIR  2006, 2012   NASAL SINUS SURGERY     POLYPECTOMY  12/11/2018   Procedure: POLYPECTOMY;  Surgeon: Golda Claudis PENNER, MD;  [facility];  Service: Endoscopy;;  ascending colon   REPLACEMENT TOTAL KNEE     Mar 17 2019    REPLACEMENT TOTAL KNEE Right    SPINE SURGERY     TONSILLECTOMY  1962   UMBILICAL HERNIA REPAIR  2003    Current Outpatient Medications  Medication Sig Dispense Refill   amLODipine  (NORVASC ) 5 MG tablet TAKE 1 TABLET(5 MG) BY MOUTH DAILY 90 tablet 3   aspirin  EC 81 MG tablet Take 1 tablet (81 mg total) by mouth daily.     atorvastatin (LIPITOR) 20 MG tablet Take 20 mg by mouth daily.     esomeprazole  (NEXIUM ) 20 MG capsule Take 20 mg by mouth daily before breakfast. Over the counter     levocetirizine (XYZAL) 5 MG tablet Take 1 tablet by mouth at bedtime.     meloxicam  (MOBIC ) 15 MG tablet Take 15 mg by mouth daily.      metoprolol  tartrate (LOPRESSOR ) 50  MG tablet TAKE 1 TABLET BY MOUTH TWICE DAILY 180 tablet 3   Multiple Vitamins-Iron (MULTIVITAMIN PLUS IRON ADULT) TABS Take 1 tablet by mouth daily.     Multiple Vitamins-Minerals (PRESERVISION AREDS 2) CAPS Take 1 capsule by mouth 2 (two) times daily.     nitroGLYCERIN  (NITROSTAT ) 0.4 MG SL tablet Place 0.4 mg under the tongue every 5 (five) minutes x 3 doses as needed for chest pain (if no relief after 2nd dose, proceed to ED or call 911).     tamsulosin (FLOMAX) 0.4 MG CAPS capsule Take 0.4 mg by mouth at bedtime.     vitamin E 400 UNIT capsule Take 400 Units by mouth daily.     No current facility-administered medications for this visit.   Allergies:  Oxycodone    Social History: The patient  reports that he quit smoking about 35 years ago. His smoking use included cigarettes. He started  smoking about 52 years ago. He has a 20 pack-year smoking history. He has never used smokeless tobacco. He reports that he does not currently use alcohol after a past usage of about 1.0 standard drink of alcohol per week. He reports that he does not use drugs.   Family History: The patient's family history includes Breast cancer (age of onset: 66) in his sister; Colon cancer in an other family member; Colon cancer (age of onset: 10) in his sister; Heart disease in his father and mother; Heart failure in an other family member; Hyperlipidemia in his father and mother; Hypertension in his father and mother; Leukemia in his maternal uncle; Lupus in an other family member.   ROS:  Please see the history of present illness. Otherwise, complete review of systems is positive for none.  All other systems are reviewed and negative.   Physical Exam: VS:  BP 138/68 (BP Location: Right Arm, Patient Position: Sitting, Cuff Size: Normal)   Pulse (!) 58   Ht 6' 1.5 (1.867 m)   SpO2 96%   BMI 28.63 kg/m , BMI Body mass index is 28.63 kg/m.  Wt Readings from Last 3 Encounters:  05/22/23 220 lb (99.8 kg)  05/09/23 219 lb (99.3 kg)  04/23/23 219 lb (99.3 kg)    General: Patient appears comfortable at rest. HEENT: Conjunctiva and lids normal, oropharynx clear with moist mucosa. Neck: Supple, no elevated JVP or carotid bruits, no thyromegaly. Lungs: Clear to auscultation, nonlabored breathing at rest. Cardiac: Regular rate and rhythm, no S3 or significant systolic murmur, no pericardial rub. Abdomen: Soft, nontender, no hepatomegaly, bowel sounds present, no guarding or rebound. Extremities: No pitting edema Skin: Warm and dry. Musculoskeletal: No kyphosis. Neuropsychiatric: Alert and oriented x3, affect grossly appropriate.  Recent Labwork: No results found for requested labs within last 365 days.     Component Value Date/Time   CHOL 120 04/14/2019 0000   TRIG 49 04/14/2019 0000   TRIG 70  01/17/2010 0000   HDL 44 04/14/2019 0000   CHOLHDL 3.0 02/26/2014 0848   VLDL 19 02/26/2014 0848   LDLCALC 62 04/14/2019 0000   LDLCALC 82 01/17/2010 0000     Assessment and Plan:  CAD in 2011 (RCA CTO with residual LAD 50 to 60% stenosis, on medical management), has stable angina: Exertional chest pressure with activities like mowing the lawn, after walking 0.5 mile.  He does not do this activities frequently.  No angina with household activities.  He will benefit from cardiac rehab but will refer to rehab after he lorrin (currently works 6 days/week).  Will continue current antianginal therapy, amlodipine  5 mg once daily and metoprolol  titrate 50 mg twice daily.  Will prescribe SL NTG 0.4 mg as needed.  Continue cardioprotective medications with aspirin  81 mg once daily and atorvastatin 20 mg nightly.  HLD, unknown values: Continue atorvastatin 20 mg at bedtime, goal LDL less than 70.  He has an upcoming appointment with his PCP for annual physical, will get all his blood work done at that time.  Left lower leg pain: Obtain ABI of bilateral lower extremities.  HTN, controlled: Continue medications as stated above.   I have spent a total duration of 30 minutes reviewing the notes, labs, face-to-face discussion of his medical condition, pathophysiology, evaluation, management, ordering labs, tests, documenting findings in the note.   Medication Adjustments/Labs and Tests Ordered: Current medicines are reviewed at length with the patient today.  Concerns regarding medicines are outlined above.    Disposition:  Follow up 6 months  Signed, Krizia Flight Arleta Maywood, MD, 12/10/2023 2:05 PM    [facility] at [facility] [address], [place], [place] [address]

## 2023-12-10 NOTE — Patient Instructions (Signed)

## 2023-12-12 ENCOUNTER — Telehealth: Payer: Self-pay

## 2023-12-12 NOTE — Telephone Encounter (Signed)
 Who is your primary care physician: Dr.Zack Shona  Reasons for the colonoscopy: Hx polyps  Have you had a colonoscopy before?  Yes 12/11/2018 Dr.Rehman  Do you have family history of colon cancer? Yes sister  Previous colonoscopy with polyps removed? yes  Do you have a history colorectal cancer?   no  Are you diabetic? If yes, Type 1 or Type 2?    no  Do you have a prosthetic or mechanical heart valve? no  Do you have a pacemaker/defibrillator?   no  Have you had endocarditis/atrial fibrillation? no  Have you had joint replacement within the last 12 months?  no  Do you tend to be constipated or have to use laxatives? no  Do you have any history of drugs or alchohol?  no  Do you use supplemental oxygen?  no  Have you had a stroke or heart attack within the last 6 months? no  Do you take weight loss medication?  no    Do you take any blood-thinning medications such as: (aspirin , warfarin, Plavix, Aggrenox)  yes  If yes we need the name, milligram, dosage and who is prescribing doctor Aspirin  81 mg  Current Outpatient Medications on File Prior to Visit  Medication Sig Dispense Refill   amLODipine  (NORVASC ) 5 MG tablet TAKE 1 TABLET(5 MG) BY MOUTH DAILY 90 tablet 3   aspirin  EC 81 MG tablet Take 1 tablet (81 mg total) by mouth daily.     atorvastatin (LIPITOR) 20 MG tablet Take 20 mg by mouth daily.     esomeprazole  (NEXIUM ) 20 MG capsule Take 20 mg by mouth daily before breakfast. Over the counter     levocetirizine (XYZAL) 5 MG tablet Take 1 tablet by mouth at bedtime.     meloxicam  (MOBIC ) 15 MG tablet Take 15 mg by mouth daily.      metoprolol  tartrate (LOPRESSOR ) 50 MG tablet TAKE 1 TABLET BY MOUTH TWICE DAILY 180 tablet 3   Multiple Vitamins-Iron (MULTIVITAMIN PLUS IRON ADULT) TABS Take 1 tablet by mouth daily.     Multiple Vitamins-Minerals (PRESERVISION AREDS 2) CAPS Take 1 capsule by mouth 2 (two) times daily.     nitroGLYCERIN  (NITROSTAT ) 0.4 MG SL tablet Place 0.4  mg under the tongue every 5 (five) minutes x 3 doses as needed for chest pain (if no relief after 2nd dose, proceed to ED or call 911).     tamsulosin (FLOMAX) 0.4 MG CAPS capsule Take 0.4 mg by mouth at bedtime.     vitamin E 400 UNIT capsule Take 400 Units by mouth daily.     No current facility-administered medications on file prior to visit.    Allergies  Allergen Reactions   Oxycodone  Nausea Only     Pharmacy: [address] [facility]  Primary Insurance Name: [facility] [id number]  Best number where you can be reached: [phone] or [phone]

## 2023-12-12 NOTE — Telephone Encounter (Signed)
Ok to schedule.  Room : any   Thanks,  Vista Lawman, MD Gastroenterology and Hepatology [facility]

## 2023-12-13 MED ORDER — PEG 3350-KCL-NA BICARB-NACL 420 G PO SOLR: 4000.0000 mL | Freq: Once | ORAL | 0 refills | Status: AC

## 2023-12-13 NOTE — Telephone Encounter (Signed)
Pt left vm returning call. °

## 2023-12-13 NOTE — Telephone Encounter (Addendum)
 SPOKE WITH PT. Scheduled for 8/18. Aware will send instructions to him, rx for prep to pharmacy

## 2023-12-13 NOTE — Telephone Encounter (Signed)
 LMOVM to return call.

## 2023-12-13 NOTE — Addendum Note (Signed)
 Addended by: JEANELL GRAEME RAMAN on: 12/13/2023 11:31 AM   Modules accepted: Orders

## 2023-12-17 NOTE — Telephone Encounter (Signed)
 Questionnaire from recall, no referral needed

## 2023-12-20 ENCOUNTER — Ambulatory Visit: Admitting: Internal Medicine

## 2024-01-06 DIAGNOSIS — M5416 Radiculopathy, lumbar region: Secondary | ICD-10-CM | POA: Diagnosis not present

## 2024-01-20 ENCOUNTER — Encounter (INDEPENDENT_AMBULATORY_CARE_PROVIDER_SITE_OTHER): Payer: Self-pay | Admitting: *Deleted

## 2024-01-20 ENCOUNTER — Encounter (HOSPITAL_COMMUNITY)
Admission: RE | Disposition: A | Discharge status: Home / Self Care | Payer: Self-pay | Source: Home / Self Care | Attending: Gastroenterology

## 2024-01-20 ENCOUNTER — Ambulatory Visit (HOSPITAL_BASED_OUTPATIENT_CLINIC_OR_DEPARTMENT_OTHER): Admitting: Anesthesiology

## 2024-01-20 ENCOUNTER — Other Ambulatory Visit: Payer: Self-pay

## 2024-01-20 ENCOUNTER — Ambulatory Visit (HOSPITAL_COMMUNITY)
Admission: RE | Admit: 2024-01-20 | Discharge: 2024-01-20 | Disposition: A | Discharge status: Home / Self Care | Attending: Gastroenterology | Admitting: Gastroenterology

## 2024-01-20 ENCOUNTER — Ambulatory Visit (HOSPITAL_COMMUNITY): Admitting: Anesthesiology

## 2024-01-20 ENCOUNTER — Encounter (HOSPITAL_COMMUNITY): Payer: Self-pay | Admitting: Gastroenterology

## 2024-01-20 DIAGNOSIS — I2510 Atherosclerotic heart disease of native coronary artery without angina pectoris: Secondary | ICD-10-CM | POA: Insufficient documentation

## 2024-01-20 DIAGNOSIS — Z1211 Encounter for screening for malignant neoplasm of colon: Secondary | ICD-10-CM

## 2024-01-20 DIAGNOSIS — Z800 Family history of malignant neoplasm of digestive organs: Secondary | ICD-10-CM | POA: Diagnosis not present

## 2024-01-20 DIAGNOSIS — Z87891 Personal history of nicotine dependence: Secondary | ICD-10-CM | POA: Diagnosis not present

## 2024-01-20 DIAGNOSIS — K648 Other hemorrhoids: Secondary | ICD-10-CM

## 2024-01-20 DIAGNOSIS — I10 Essential (primary) hypertension: Secondary | ICD-10-CM | POA: Diagnosis not present

## 2024-01-20 LAB — HM COLONOSCOPY

## 2024-01-20 SURGERY — COLONOSCOPY
Anesthesia: General

## 2024-01-20 MED ORDER — LIDOCAINE 2% (20 MG/ML) 5 ML SYRINGE
INTRAMUSCULAR | Status: DC | PRN
  Administered 2024-01-20: 80 mg via INTRAVENOUS

## 2024-01-20 MED ORDER — LACTATED RINGERS IV SOLN: INTRAVENOUS | Status: DC

## 2024-01-20 MED ORDER — LACTATED RINGERS IV SOLN: INTRAVENOUS | Status: DC | PRN

## 2024-01-20 MED ORDER — PROPOFOL 500 MG/50ML IV EMUL
INTRAVENOUS | Status: DC | PRN
  Administered 2024-01-20: 125 ug/kg/min via INTRAVENOUS
  Administered 2024-01-20: 80 mg via INTRAVENOUS

## 2024-01-20 NOTE — Anesthesia Postprocedure Evaluation (Signed)
 Anesthesia Post Note  Patient: Timothy Shaw  Procedure(s) Performed: COLONOSCOPY  Patient location during evaluation: Endoscopy Anesthesia Type: General Level of consciousness: awake and alert Pain management: pain level controlled Vital Signs Assessment: post-procedure vital signs reviewed and stable Respiratory status: spontaneous breathing, nonlabored ventilation and respiratory function stable Cardiovascular status: blood pressure returned to baseline and stable Postop Assessment: no apparent nausea or vomiting Anesthetic complications: no   There were no known notable events for this encounter.   Last Vitals:  Vitals:   01/20/24 0931 01/20/24 1051  BP: 136/87 119/71  Pulse: (!) 55 61  Resp: 16 12  Temp: 36.5 C 36.9 C  SpO2: 97% 96%    Last Pain:  Vitals:   01/20/24 1051  TempSrc: Axillary  PainSc: 0-No pain                 Graciella Arment L Signe Tackitt

## 2024-01-20 NOTE — Discharge Instructions (Signed)

## 2024-01-20 NOTE — Transfer of Care (Signed)
 Immediate Anesthesia Transfer of Care Note  Patient: Timothy Shaw  Procedure(s) Performed: COLONOSCOPY  Patient Location: PACU  Anesthesia Type:General  Level of Consciousness: awake, alert , oriented, and patient cooperative  Airway & Oxygen Therapy: Patient Spontanous Breathing  Post-op Assessment: Report given to RN, Post -op Vital signs reviewed and stable, and Patient moving all extremities X 4  Post vital signs: Reviewed and stable  Last Vitals:  Vitals Value Taken Time  BP 119/71 01/20/24 10:51  Temp 36.9 C 01/20/24 10:51  Pulse 61 01/20/24 10:51  Resp 12 01/20/24 10:51  SpO2 96 % 01/20/24 10:51    Last Pain:  Vitals:   01/20/24 1051  TempSrc: Axillary  PainSc: 0-No pain      Patients Stated Pain Goal: 5 (01/20/24 0931)  Complications: No notable events documented.

## 2024-01-20 NOTE — Op Note (Signed)
 [facility] Patient Name: Timothy Shaw Procedure Date: 01/20/2024 10:25 AM MRN: [id number] Date of Birth: [birth year] Attending MD: Deatrice Dine , MD, [id number] CSN: [id number] Age: 68 Admit Type: Outpatient Procedure:                Colonoscopy Indications:              Screening in patient at increased risk: Colorectal                            cancer in sister before age 31 Providers:                Deatrice Dine, MD, Tammy Vaught, RN, Kristine L.                            Shirlean Balm, Technician Referring MD:              Medicines:                Monitored Anesthesia Care Complications:            No immediate complications. Estimated Blood Loss:     Estimated blood loss: none. Procedure:                Pre-Anesthesia Assessment:                           - Prior to the procedure, a History and Physical                            was performed, and patient medications and                            allergies were reviewed. The patient's tolerance of                            previous anesthesia was also reviewed. The risks                            and benefits of the procedure and the sedation                            options and risks were discussed with the patient.                            All questions were answered, and informed consent                            was obtained. Prior Anticoagulants: The patient has                            taken no anticoagulant or antiplatelet agents                            except for aspirin . ASA Grade Assessment: II - A  patient with mild systemic disease. After reviewing                            the risks and benefits, the patient was deemed in                            satisfactory condition to undergo the procedure.                           After obtaining informed consent, the colonoscope                            was passed under direct vision. Throughout the                             procedure, the patient's blood pressure, pulse, and                            oxygen saturations were monitored continuously. The                            [id number]-[id number] ([id number]) Colon was introduced through                            the anus and advanced to the the cecum, identified                            by appendiceal orifice and ileocecal valve. The                            colonoscopy was performed without difficulty. The                            patient tolerated the procedure well. The quality                            of the bowel preparation was evaluated using the                            BBPS [facility] Bowel Preparation Scale) with scores                            of: Right Colon = 3, Transverse Colon = 3 and Left                            Colon = 3 (entire mucosa seen well with no residual                            staining, small fragments of stool or opaque                            liquid). The total BBPS score equals 9. The  ileocecal valve, appendiceal orifice, and rectum                            were photographed. Scope In: 10:36:04 AM Scope Out: 10:48:04 AM Scope Withdrawal Time: 0 hours 8 minutes 28 seconds  Total Procedure Duration: 0 hours 12 minutes 0 seconds  Findings:      The perianal and digital rectal examinations were normal.      Non-bleeding internal hemorrhoids were found during retroflexion. The       hemorrhoids were small.      The exam was otherwise normal throughout the examined colon. Impression:               - Non-bleeding internal hemorrhoids.                           - No specimens collected. Moderate Sedation:      Per Anesthesia Care Recommendation:           - Patient has a contact number available for                            emergencies. The signs and symptoms of potential                            delayed complications were discussed with the                            patient. Return to  normal activities tomorrow.                            Written discharge instructions were provided to the                            patient.                           - Resume previous diet.                           - Continue present medications.                           - Await pathology results.                           - Repeat colonoscopy in 5 years for screening                            purposes; family history of colon cancer                           - Return to primary care physician as previously                            scheduled. Procedure Code(s):        --- Professional ---  G0105, Colorectal cancer screening; colonoscopy on                            individual at high risk Diagnosis Code(s):        --- Professional ---                           Z80.0, Family history of malignant neoplasm of                            digestive organs                           K64.8, Other hemorrhoids CPT copyright 2022 American Medical Association. All rights reserved. The codes documented in this report are preliminary and upon coder review may  be revised to meet current compliance requirements. Deatrice Dine, MD Deatrice Dine, MD 01/20/2024 10:57:29 AM This report has been signed electronically. Number of Addenda: 0

## 2024-01-20 NOTE — Anesthesia Preprocedure Evaluation (Signed)
 Anesthesia Evaluation  Patient identified by MRN, date of birth, ID band Patient awake    Reviewed: Allergy & Precautions, H&P , NPO status , Patient's Chart, lab work & pertinent test results, reviewed documented beta blocker date and time   History of Anesthesia Complications (+) PONV  Airway Mallampati: II  TM Distance: >3 FB Neck ROM: full    Dental no notable dental hx. (+) Dental Advisory Given, Teeth Intact   Pulmonary neg pulmonary ROS, former smoker   Pulmonary exam normal breath sounds clear to auscultation       Cardiovascular Exercise Tolerance: Good hypertension, + CAD  Normal cardiovascular exam Rhythm:regular Rate:Normal  Normal stress test 2022   Neuro/Psych  Headaches PSYCHIATRIC DISORDERS Anxiety      Neuromuscular disease  negative psych ROS   GI/Hepatic Neg liver ROS,GERD  ,,  Endo/Other  negative endocrine ROS    Renal/GU negative Renal ROS  negative genitourinary   Musculoskeletal  (+) Arthritis , Osteoarthritis,    Abdominal   Peds  Hematology  (+) Blood dyscrasia Clotting disorder   Anesthesia Other Findings DVT  Reproductive/Obstetrics negative OB ROS                              Anesthesia Physical Anesthesia Plan  ASA: 3  Anesthesia Plan: General   Post-op Pain Management: Minimal or no pain anticipated   Induction: Intravenous  PONV Risk Score and Plan: Propofol  infusion  Airway Management Planned: Natural Airway and Nasal Cannula  Additional Equipment: None  Intra-op Plan:   Post-operative Plan:   Informed Consent: I have reviewed the patients History and Physical, chart, labs and discussed the procedure including the risks, benefits and alternatives for the proposed anesthesia with the patient or authorized representative who has indicated his/her understanding and acceptance.     Dental Advisory Given  Plan Discussed with:  CRNA  Anesthesia Plan Comments:         Anesthesia Quick Evaluation

## 2024-01-20 NOTE — H&P (Addendum)
 Primary Care Physician:  Shona Norleen PEDLAR, MD Primary Gastroenterologist:  Dr. Cinderella  Pre-Procedure History & Physical: HPI:  Timothy Shaw is a 68 y.o. male is here for a colonoscopy for colon cancer screening purposes.  Sister with colon cancer in her 40s No melena or hematochezia.  No abdominal pain or unintentional weight loss.  No change in bowel habits.  Overall feels well from a GI standpoint.  2020  - One small polyp in the proximal ascending colon witheroded surface. Biopsied.  Polyp is granulation tissue.  Next colonoscopy in 5 years because of positive family history.   Past Medical History:  Diagnosis Date   Acute embolism and thrombosis of unspecified deep veins of left lower extremity (HCC)    Anemia    Anxiety    Anxiety disorder    Arteriosclerotic cardiovascular disease (ASCVD)    with angina: 09/2009-100% proximal RCA; 50% proximal&60% mid LAD; 30-50% mid-Cx, normal EF   Arthritis    Lyme disease in 10/2009 treated with antibiotics   CAD (coronary artery disease)    a. s/p cath in 2011 showing CTO of RCA and medical management pursued   Chronic headaches    Clotting disorder (HCC)    Cutaneous abscess of right lower limb    Gastroesophageal reflux disease    Hyperlipidemia    Total cholesterol-136, triglycerides of 89, HDL 48 and LDL of 70 in 03/2010   Hypertension    Left ventricular hypertrophy   Polyosteoarthritis, unspecified    PONV (postoperative nausea and vomiting)    Tobacco abuse, in remission    20 pack years; mild airway obstruction on PFTs in 2010    Past Surgical History:  Procedure Laterality Date   APPENDECTOMY  1970   COLONOSCOPY  2008   COLONOSCOPY N/A 12/11/2018   Procedure: COLONOSCOPY;  Surgeon: Golda Claudis PENNER, MD;  Location: [facility];  Service: Endoscopy;  Laterality: N/A;  730   HERNIA REPAIR     inguinal hernia repair     KNEE ARTHROSCOPY W/ MENISCAL REPAIR  2006, 2012   NASAL SINUS SURGERY     POLYPECTOMY  12/11/2018    Procedure: POLYPECTOMY;  Surgeon: Golda Claudis PENNER, MD;  Location: [facility];  Service: Endoscopy;;  ascending colon   REPLACEMENT TOTAL KNEE     Mar 17 2019    REPLACEMENT TOTAL KNEE Right    SPINE SURGERY     TONSILLECTOMY  1962   UMBILICAL HERNIA REPAIR  2003    Prior to Admission medications   Medication Sig Start Date End Date Taking? Authorizing Provider  amLODipine  (NORVASC ) 5 MG tablet TAKE 1 TABLET(5 MG) BY MOUTH DAILY 06/19/23  Yes Strader, Laymon HERO, PA-C  aspirin  EC 81 MG tablet Take 1 tablet (81 mg total) by mouth daily. 12/12/18  Yes Rehman, Claudis PENNER, MD  atorvastatin (LIPITOR) 20 MG tablet Take 20 mg by mouth daily. 03/22/22  Yes [provider]  esomeprazole  (NEXIUM ) 20 MG capsule Take 20 mg by mouth daily before breakfast. Over the counter   Yes [provider]  meloxicam  (MOBIC ) 15 MG tablet Take 15 mg by mouth daily.    Yes [provider]  metoprolol  tartrate (LOPRESSOR ) 50 MG tablet TAKE 1 TABLET BY MOUTH TWICE DAILY 06/19/23  Yes Strader, Brittany M, PA-C  Multiple Vitamins-Minerals (PRESERVISION AREDS 2) CAPS Take 1 capsule by mouth 2 (two) times daily.   Yes [provider]  tamsulosin (FLOMAX) 0.4 MG CAPS capsule Take 0.4 mg by  mouth at bedtime.   Yes [provider]  vitamin E 400 UNIT capsule Take 400 Units by mouth daily.   Yes [provider]  nitroGLYCERIN  (NITROSTAT ) 0.4 MG SL tablet Place 0.4 mg under the tongue every 5 (five) minutes x 3 doses as needed for chest pain (if no relief after 2nd dose, proceed to ED or call 911).    [provider]    Allergies as of 12/13/2023 - Review Complete 12/12/2023  Allergen Reaction Noted   Oxycodone  Nausea Only 12/13/2021    Family History  Problem Relation Age of Onset   Heart disease Mother    Hyperlipidemia Mother    Hypertension Mother    Heart disease Father    Hyperlipidemia Father    Hypertension Father    Colon cancer Sister 34   Breast  cancer Sister 29       dx. again at age 9, reports negative genetics   Leukemia Maternal Uncle        dx. >50   Heart failure Other        conduction system disease requiring pacemaker   Lupus Other        Sibling   Colon cancer Other        Sibling   Esophageal cancer Neg Hx    Stomach cancer Neg Hx    Rectal cancer Neg Hx     Social History   Socioeconomic History   Marital status: Married    Spouse name: Not on file   Number of children: 1   Years of education: Not on file   Highest education level: Not on file  Occupational History   Occupation: Maintenance technician  Tobacco Use   Smoking status: Former    Current packs/day: 0.00    Average packs/day: 1 pack/day for 20.0 years (20.0 ttl pk-yrs)    Types: Cigarettes    Start date: 06/29/1971    Quit date: 06/04/1988    Years since quitting: 35.6   Smokeless tobacco: Never  Vaping Use   Vaping status: Never Used  Substance and Sexual Activity   Alcohol use: Not Currently    Alcohol/week: 1.0 standard drink of alcohol    Types: 1 Standard drinks or equivalent per week   Drug use: No   Sexual activity: Yes  Other Topics Concern   Not on file  Social History Narrative   Not on file   Social Drivers of Health   Financial Resource Strain: Not on file  Food Insecurity: Not on file  Transportation Needs: Not on file  Physical Activity: Not on file  Stress: Not on file  Social Connections: Not on file  Intimate Partner Violence: Not on file    Review of Systems: See HPI, otherwise negative ROS  Physical Exam: Vital signs in last 24 hours: Temp:  [97.7 F (36.5 C)] 97.7 F (36.5 C) (08/18 0931) Pulse Rate:  [55] 55 (08/18 0931) Resp:  [16] 16 (08/18 0931) BP: (136)/(87) 136/87 (08/18 0931) SpO2:  [97 %] 97 % (08/18 0931) Weight:  [97.5 kg] 97.5 kg (08/18 0931)   General:   Alert,  Well-developed, well-nourished, pleasant and cooperative in NAD Head:  Normocephalic and atraumatic. Eyes:  Sclera clear,  no icterus.   Conjunctiva pink. Ears:  Normal auditory acuity. Nose:  No deformity, discharge,  or lesions. Msk:  Symmetrical without gross deformities. Normal posture. Extremities:  Without clubbing or edema. Neurologic:  Alert and  oriented x4;  grossly normal neurologically. Skin:  Intact without significant lesions or rashes. Psych:  Alert and cooperative. Normal mood and affect.  Impression/Plan: Timothy Shaw is here for a colonoscopy to be performed for colon cancer screening purposes.  The risks of the procedure including infection, bleed, or perforation as well as benefits, limitations, alternatives and imponderables have been reviewed with the patient. Questions have been answered. All parties agreeable.

## 2024-01-21 ENCOUNTER — Encounter (HOSPITAL_COMMUNITY): Payer: Self-pay | Admitting: Gastroenterology

## 2024-03-27 DIAGNOSIS — R7303 Prediabetes: Secondary | ICD-10-CM | POA: Diagnosis not present

## 2024-03-27 DIAGNOSIS — I10 Essential (primary) hypertension: Secondary | ICD-10-CM | POA: Diagnosis not present

## 2024-04-02 ENCOUNTER — Encounter: Payer: Self-pay | Admitting: Internal Medicine

## 2024-06-11 ENCOUNTER — Telehealth: Payer: Self-pay | Admitting: Internal Medicine

## 2024-06-11 DIAGNOSIS — I10 Essential (primary) hypertension: Secondary | ICD-10-CM

## 2024-06-11 DIAGNOSIS — I25118 Atherosclerotic heart disease of native coronary artery with other forms of angina pectoris: Secondary | ICD-10-CM

## 2024-06-11 MED ORDER — AMLODIPINE BESYLATE 5 MG PO TABS: 5.0000 mg | ORAL_TABLET | Freq: Every day | ORAL | 1 refills | Status: AC

## 2024-06-11 MED ORDER — METOPROLOL TARTRATE 50 MG PO TABS: 50.0000 mg | ORAL_TABLET | Freq: Two times a day (BID) | ORAL | 3 refills | Status: DC

## 2024-06-11 MED ORDER — NITROGLYCERIN 0.4 MG SL SUBL: 0.4000 mg | SUBLINGUAL_TABLET | SUBLINGUAL | 1 refills | Status: AC | PRN

## 2024-06-11 NOTE — Telephone Encounter (Signed)
 Pt came into office and stated that he has a new insurance and using a new pharmacy for his medications. He stated that he has a few meds that need refills. Amlodipine  and metoprolol  tartrate. He stated he has never taken the nitroglycerin  but if she wants him to have a fresh bottle of that he will need a new refill. Pt is using [facility] in [place]. [phone] is the best number to reach the pt if you have any questions.

## 2024-06-11 NOTE — Telephone Encounter (Signed)
 RX sent in.

## 2024-06-13 ENCOUNTER — Other Ambulatory Visit: Payer: Self-pay | Admitting: Student

## 2024-06-13 DIAGNOSIS — I25118 Atherosclerotic heart disease of native coronary artery with other forms of angina pectoris: Secondary | ICD-10-CM

## 2024-06-13 DIAGNOSIS — I10 Essential (primary) hypertension: Secondary | ICD-10-CM
